# Patient Record
Sex: Female | Born: 1960 | Race: White | Hispanic: No | Marital: Married | Smoking: Former smoker
Health system: Southern US, Community
[De-identification: ages and names within clinical notes are randomized; demographics above are authoritative.]

## PROBLEM LIST (undated history)

## (undated) DIAGNOSIS — T7840XA Allergy, unspecified, initial encounter: Secondary | ICD-10-CM

## (undated) DIAGNOSIS — I10 Essential (primary) hypertension: Secondary | ICD-10-CM

## (undated) DIAGNOSIS — J302 Other seasonal allergic rhinitis: Secondary | ICD-10-CM

## (undated) DIAGNOSIS — E785 Hyperlipidemia, unspecified: Secondary | ICD-10-CM

## (undated) DIAGNOSIS — G4730 Sleep apnea, unspecified: Secondary | ICD-10-CM

## (undated) DIAGNOSIS — E119 Type 2 diabetes mellitus without complications: Secondary | ICD-10-CM

## (undated) HISTORY — DX: Hyperlipidemia, unspecified: E78.5

## (undated) HISTORY — DX: Type 2 diabetes mellitus without complications: E11.9

## (undated) HISTORY — PX: CHOLECYSTECTOMY: SHX55

## (undated) HISTORY — DX: Allergy, unspecified, initial encounter: T78.40XA

## (undated) HISTORY — DX: Sleep apnea, unspecified: G47.30

## (undated) HISTORY — PX: TUBAL LIGATION: SHX77

## (undated) HISTORY — DX: Essential (primary) hypertension: I10

## (undated) HISTORY — DX: Other seasonal allergic rhinitis: J30.2

---

## 1995-04-13 HISTORY — PX: TUBAL LIGATION: SHX77

## 2002-03-19 ENCOUNTER — Other Ambulatory Visit: Admission: RE | Admit: 2002-03-19 | Discharge: 2002-03-19 | Payer: Self-pay | Admitting: Family Medicine

## 2002-05-22 ENCOUNTER — Encounter: Payer: Self-pay | Admitting: Family Medicine

## 2002-05-22 ENCOUNTER — Encounter: Admission: RE | Admit: 2002-05-22 | Discharge: 2002-05-22 | Payer: Self-pay | Admitting: Family Medicine

## 2002-05-23 ENCOUNTER — Other Ambulatory Visit: Admission: RE | Admit: 2002-05-23 | Discharge: 2002-05-23 | Payer: Self-pay | Admitting: Family Medicine

## 2002-09-03 ENCOUNTER — Encounter: Admission: RE | Admit: 2002-09-03 | Discharge: 2002-09-03 | Payer: Self-pay | Admitting: Family Medicine

## 2002-09-03 ENCOUNTER — Encounter: Payer: Self-pay | Admitting: Family Medicine

## 2003-07-23 ENCOUNTER — Encounter: Admission: RE | Admit: 2003-07-23 | Discharge: 2003-07-23 | Payer: Self-pay | Admitting: Family Medicine

## 2003-11-14 ENCOUNTER — Emergency Department (HOSPITAL_COMMUNITY): Admission: EM | Admit: 2003-11-14 | Discharge: 2003-11-15 | Payer: Self-pay | Admitting: Emergency Medicine

## 2003-12-03 ENCOUNTER — Encounter: Admission: RE | Admit: 2003-12-03 | Discharge: 2003-12-03 | Payer: Self-pay | Admitting: Family Medicine

## 2004-03-07 ENCOUNTER — Ambulatory Visit (HOSPITAL_COMMUNITY): Admission: RE | Admit: 2004-03-07 | Discharge: 2004-03-07 | Payer: Self-pay | Admitting: Emergency Medicine

## 2004-03-07 ENCOUNTER — Emergency Department (HOSPITAL_COMMUNITY): Admission: EM | Admit: 2004-03-07 | Discharge: 2004-03-08 | Payer: Self-pay | Admitting: Emergency Medicine

## 2004-03-13 ENCOUNTER — Encounter (INDEPENDENT_AMBULATORY_CARE_PROVIDER_SITE_OTHER): Payer: Self-pay | Admitting: *Deleted

## 2004-03-13 ENCOUNTER — Ambulatory Visit (HOSPITAL_COMMUNITY): Admission: RE | Admit: 2004-03-13 | Discharge: 2004-03-14 | Payer: Self-pay | Admitting: General Surgery

## 2004-06-03 ENCOUNTER — Encounter: Admission: RE | Admit: 2004-06-03 | Discharge: 2004-06-03 | Payer: Self-pay | Admitting: Family Medicine

## 2005-04-28 ENCOUNTER — Encounter: Admission: RE | Admit: 2005-04-28 | Discharge: 2005-04-28 | Payer: Self-pay | Admitting: Obstetrics and Gynecology

## 2006-06-10 ENCOUNTER — Encounter: Admission: RE | Admit: 2006-06-10 | Discharge: 2006-06-10 | Payer: Self-pay | Admitting: Obstetrics and Gynecology

## 2006-08-03 ENCOUNTER — Ambulatory Visit: Payer: Self-pay | Admitting: Cardiovascular Disease

## 2006-08-08 ENCOUNTER — Ambulatory Visit (HOSPITAL_COMMUNITY): Admission: RE | Admit: 2006-08-08 | Discharge: 2006-08-08 | Payer: Self-pay | Admitting: Internal Medicine

## 2006-08-08 ENCOUNTER — Ambulatory Visit: Payer: Self-pay | Admitting: Internal Medicine

## 2006-08-10 ENCOUNTER — Encounter: Payer: Self-pay | Admitting: Cardiology

## 2006-08-10 ENCOUNTER — Ambulatory Visit: Payer: Self-pay

## 2006-09-06 ENCOUNTER — Ambulatory Visit: Payer: Self-pay | Admitting: Cardiovascular Disease

## 2007-07-05 ENCOUNTER — Encounter: Admission: RE | Admit: 2007-07-05 | Discharge: 2007-07-05 | Payer: Self-pay | Admitting: Obstetrics and Gynecology

## 2008-08-01 ENCOUNTER — Encounter: Admission: RE | Admit: 2008-08-01 | Discharge: 2008-08-01 | Payer: Self-pay | Admitting: Obstetrics and Gynecology

## 2009-08-04 ENCOUNTER — Encounter: Admission: RE | Admit: 2009-08-04 | Discharge: 2009-08-04 | Payer: Self-pay | Admitting: Obstetrics and Gynecology

## 2010-08-17 ENCOUNTER — Other Ambulatory Visit: Payer: Self-pay | Admitting: Obstetrics and Gynecology

## 2010-08-17 DIAGNOSIS — Z1231 Encounter for screening mammogram for malignant neoplasm of breast: Secondary | ICD-10-CM

## 2010-08-24 ENCOUNTER — Ambulatory Visit
Admission: RE | Admit: 2010-08-24 | Discharge: 2010-08-24 | Disposition: A | Discharge status: Home / Self Care | Payer: 59 | Source: Ambulatory Visit | Attending: Obstetrics and Gynecology | Admitting: Obstetrics and Gynecology

## 2010-08-24 DIAGNOSIS — Z1231 Encounter for screening mammogram for malignant neoplasm of breast: Secondary | ICD-10-CM

## 2010-08-25 NOTE — Assessment & Plan Note (Signed)
[facility]                            CARDIOLOGY OFFICE NOTE   NAME:Ahlquist, KIRBI FARRUGIA                      MRN:          [id number]  DATE:09/06/2006                            DOB:          17-Feb-1961    Danielle Chase was seen back at the [facility] Cardiology office on Sep 06, 2006.  She is a 50 year old woman who was seen here on April 23 for an  assessment of lightheadedness.  She has complained of generalized  fatigue as well as a feeling of lightheadedness.  She denies symptoms of  vertigo.  She has not had syncope.  She has no chest pain or dyspnea.  She has been seen by Dr. Sandria Manly in neurology and there has been  consideration for her symptoms arising from migraines.  She has been  started on Inderal for that problem.  She underwent an echocardiogram  and a tilt table study for further assessment after initial evaluation.  Her echocardiogram was normal.  Her left ventricular ejection fraction  was estimated at 60% and there were no regional wall motion  abnormalities.  There was no significant valvular disease seen.  There  were no other abnormalities appreciated.  Her tilt table study was also  normal, although she did not have much of a heart rate response to  isoproterenol.  She did not develop symptoms, nor did she become  hypotensive on the tilt table.   CURRENT MEDICATIONS:  1. Hydrochlorothiazide 25 mg daily.  2. Inderal ER 60 mg daily.   ALLERGIES:  No known drug allergies.   PHYSICAL EXAMINATION:  GENERAL:  The patient is alert and oriented, in  no acute distress.  She is an obese white female.  VITAL SIGNS:  Weight is 264 pounds, blood pressure is 130/80, heart rate  is 68, respiratory rate 16.  HEENT:  Normal.  NECK:  Normal carotid upstrokes without bruits.  Jugular venous pressure  is normal.  LUNGS:  Clear to auscultation bilaterally.  HEART:  Regular rate and rhythm without murmurs or gallops.  ABDOMEN:  Soft, obese,  nontender.  EXTREMITIES:  There is 1+ pretibial edema bilaterally.  Peripheral  pulses are 2+ and equal throughout.   ASSESSMENT:  Danielle Chase is a 50 year old woman with lightheadedness.  We  have been unable to find a cardiac etiology to her problem.  I do not  think further testing is required at this point.  I have asked her to  continue to try to work through things with Dr. Sandria Manly to see if her  symptoms are related to migraines as he has indicated.   I would be happy to see Danielle Chase back at any time in the future.  I  will plan on seeing her back on an as-needed basis if any specific  problems arise.     Veverly Fells. Excell Seltzer, MD  Electronically Signed    MDC/MedQ  DD: 09/06/2006  DT: 09/06/2006  Job #: [id number]   cc:   Brett Canales A. Cleta Alberts, M.D.  Genene Churn. Love, M.D.

## 2010-08-28 NOTE — Letter (Signed)
August 03, 2006    Danielle Chase, M.D.  [address]  [place], [place] [address]   RE:  Danielle, Chase  MRN:  [id number]  /  DOB:  [birth year]   Dear Dr. Cleta Chase,   It was my pleasure to see Danielle Chase as an outpatient at the [facility]  cardiology office on August 03, 2006.  As you know, she is a 50 year old  woman here for evaluation of lightheadedness.   Danielle Chase complains of progressive lightheadedness over the past several  months.  She has this symptom throughout much of the day.  She really  does not have dizziness, and she denies a spinning sensation.  She  feels fatigued and lacks energy.  She has not had true syncope.  She has  developed tunnel vision on a few occasions but has not ever lost  consciousness.  She really does not have any associated symptoms of  nausea, vomiting, chest pain, dyspnea, or other complaints.  She denies  feeling depressed but states that she has lost interest in doing many of  the things she used to enjoy.  She does have a postural component to her  dizziness, and her symptoms are worse when she first stands up.   CURRENT MEDICATIONS:  1. Hydrochlorothiazide 25 mg daily.  2. Inderal ER 60 mg daily.   ALLERGIES:  NKDA.   PAST MEDICAL HISTORY:  1. Essential hypertension, treated with medication for the past 18      months.  2. Cholecystectomy in 2006.  3. C-section in 1988.  4. Tubal ligation in 1997.   There have been no other hospitalizations or significant medical  problems to report.   SOCIAL HISTORY:  Patient is married with two children.  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  She is a former smoker but quit in 2005.  She smoked one pack daily since age 33.  She very rarely drinks alcohol  and drinks approximately three caffeinated beverages daily.  She drinks  a lot of water as well.   FAMILY HISTORY:  Her father had heart problems at age 25.  He died at  age 59 of a heart attack.  Sister had an MI at age 27.   REVIEW OF  SYSTEMS:  A complete 12-point review of systems was performed.  Pertinent positives include seasonal allergies, fatigue, anxiety, and  menstrual dysfunction.  All other systems are reviewed and are negative  except as detailed above.   PHYSICAL EXAMINATION:  GENERAL:  Patient is alert and oriented.  She is  in no acute distress.  She is an obese white female.  VITAL SIGNS:  Weight is 262 pounds.  Blood pressure is 132/86.  Heart  rate 62.  Respiratory rate 18.  Her orthostatic vital signs were taken,  and supine heart rate was 80 with a supine blood pressure of 129/80  sitting, heart rate was 68 with a sitting blood pressure of 129/76.  Standing at 2 minutes, her heart rate was 84 with a standing blood  pressure of 136/82.  She was asymptomatic with this.  HEENT:  Normal.  NECK:  Normal carotid upstrokes without bruits.  Jugular venous pressure  is normal.  No thyromegaly or thyroid nodules.  LUNGS:  Clear to auscultation bilaterally.  HEART:  The apex is not palpable.  There is no right ventricular heave  or lift.  The heart is a regular rate and rhythm without murmurs or  gallops.  ABDOMEN:  Soft, obese,  nontender.  No organomegaly.  No abdominal  bruits.  EXTREMITIES:  No clubbing, cyanosis or edema.  Peripheral pulses are 2+  and equal throughout.  SKIN:  Warm and dry without rash.  NEUROLOGIC:  Cranial nerves II-XII are intact.  Strength is 5/5 and  equal in the arms and legs bilaterally.   EKG is normal sinus rhythm with sinus arrhythmia.  It is a normal  tracing.   ASSESSMENT:  Danielle Chase is a 50 year old woman with lightheadedness.  She  has had some near-syncopal spells but has not had frank syncope.  I do  not detect any structural abnormalities on her physical exam, and her  baseline ECG is normal.  With the degree of problems she has with her  lightheadedness, will evaluate her with an echocardiogram to rule out  any structural heart disease as well as a tilt table  test.  Overall, I  think it is unlikely that she has a cardiac etiology to her symptoms,  but with her significant disability from her symptoms, I think they  warrant some investigation.   I encouraged her to continue with good fluid intake and to minimize  caffeine.  If she is found to have a problem with tilt table testing  that is suggestive of neurodeppressor syncope, it may be prudent to  discontinue her hydrochlorothiazide and use a different medication for  her hypertension, as maintaining an intravascular volume is very  important in this condition.   Dr. Cleta Chase, thanks very much for allowing me to see Danielle Chase.  I will be  in touch with you after her studies are completed.  Please feel free to  call at any time with questions regarding her care.    Sincerely,      Veverly Fells. Excell Seltzer, MD  Electronically Signed    MDC/MedQ  DD: 08/03/2006  DT: 08/04/2006  Job #: [id number]   CC:    Genene Churn. Love, M.D.

## 2010-08-28 NOTE — Op Note (Signed)
Danielle Chase, Danielle Chase               ACCOUNT NO.:  [id number]   MEDICAL RECORD NO.:  [id number]          PATIENT TYPE:  OIB   LOCATION:  5707                         FACILITY:  [facility]   PHYSICIAN:  Adolph Pollack, M.D.DATE OF BIRTH:  1960-07-29   DATE OF PROCEDURE:  03/13/2004  DATE OF DISCHARGE:  03/14/2004                                 OPERATIVE REPORT   PREOPERATIVE DIAGNOSIS:  Cholelithiasis, cholecystitis.   POSTOPERATIVE DIAGNOSIS:  Subacute cholecystitis with cholelithiasis.   OPERATION PERFORMED:  Laparoscopic cholecystectomy with intraoperative  cholangiogram.   SURGEON:  Adolph Pollack, M.D.   ASSISTANT:  Ollen Gross. Carolynne Edouard, M.D.   ANESTHESIA:  General.   INDICATIONS FOR PROCEDURE:  This 50 year old female was seen by me the  Saturday after Thanksgiving.  She went to Urgent Care because she had an  attack of abdominal pain, biliary colic in nature.  A CT scan had been  performed at [facility] demonstrated the findings were suggestive  of cholelithiasis and possible cholecystitis.  I evaluated her in the  emergency department.  She was afebrile, was pain free, had no significant  tenderness and a normal white blood cell count and a very mild elevation of  her bilirubin to 1.3.  The rest of the liver function tests were normal.  I  did not feel urgent operation was necessary at that time so she was  scheduled for elective surgery.   DESCRIPTION OF PROCEDURE:  She was seen in the holding area and brought to  the operating room, placed supine on the operating table and a general  anesthetic was administered.  The abdominal wall was sterilely prepped and  draped.  A local anesthetic was infiltrated in the subumbilical region and a  small subumbilical incision was made incising the skin and subcutaneous  tissue sharply.  The fascia and peritoneum were then incised sharply and the  peritoneal cavity was entered under direct vision.  A pursestring suture of  0  Vicryl was placed around the fascial edges.  A Hasson trocar was  introduced into the peritoneal cavity and a pneumoperitoneum was created by  insufflation of CO2 gas.  Next, she was placed in reverse Trendelenburg  position, the right side tilted up. An 11 mm port was placed through an  epigastric incision and two 5 mm ports were placed in the right midlateral  abdomen.  I identified the gallbladder and it appeared to have some acute  and chronic inflammatory changes and was somewhat distended.  Needle  decompression was then performed and green bile was evacuated from the  gallbladder.  The fundus of the gallbladder was then grasped and retracted  toward the right shoulder.  Using blunt dissection and Select cautery, I  then mobilize the infundibulum.  I identified the cystic duct and created a  window around it.  I placed a clip just at the cystic duct gallbladder  junction and made a small incision at the cystic duct.  A cholangiocatheter  was passed through the anterior abdominal wall and put into the cystic duct  and a cholangiogram was performed.  Under real time fluoroscopy, dilute contrast material was injected into the  cystic duct which was a moderate length.  Common hepatic, right and left  hepatic and common bile ducts all filled promptly and contrast entered the  duodenum.  There was no obvious evidence of obstruction.  Final reports  pending the radiologist's interpretation.   The cholangiocath was removed.  The cystic duct was then clipped three times  proximally and divided.  The cystic artery was identified and a window  created around it.  It was then clipped and divided.  The gallbladder was  dissected free from the liver bed and then placed into the EndoPouch bag.  The raw liver surface was then evaluated and bleeding points cauterized.  Surgicel was placed on the raw liver surface.  The perihepatic area was  irrigated with copious amounts of saline and fluid  evacuated.  The  gallbladder in the EndoPouch bag was then removed through the subumbilical  port and the subumbilical fascial defect was closed under laparoscopic  vision by tightening up and tying down the pursestring suture.  The trocars  were then removed and the pneumoperitoneum was released.  The skin incisions  were then closed with 4-0 Monocryl subcuticular stitches followed by Steri-  Strips and sterile dressings.  The patient tolerated the procedure well  without apparent complications and was taken to the recovery room in  satisfactory condition.      Tish Men  D:  03/18/2004  T:  03/18/2004  Job:  [id number]   cc:   Brett Canales A. Cleta Alberts, M.D.  [address]  [place]  [place] [address]  Fax: [phone]

## 2010-08-28 NOTE — H&P (Signed)
Danielle Chase, Danielle Chase               ACCOUNT NO.:  [id number]   MEDICAL RECORD NO.:  [id number]          PATIENT TYPE:  INP   LOCATION:                               FACILITY:  [facility]   PHYSICIAN:  Adolph Pollack, M.D.DATE OF BIRTH:  November 24, 1960   DATE OF ADMISSION:  DATE OF DISCHARGE:                                HISTORY & PHYSICAL   CHIEF COMPLAINT:  Right upper quadrant and right back pain.   HISTORY OF PRESENT ILLNESS:  This is a 50 year old female who had the onset  of right upper quadrant pain and back pain.  The pain seemed to radiate from  the back to the right upper quadrant.  No nausea, vomiting, or diarrhea.  No  fever or chills.  The pain began yesterday and was fairly persistent.  She  subsequently went to the urgent care center and was noted to have a slight  elevation of her white cell count 13,200.  Dr. Cleta Alberts had seen her and noticed  the right upper quadrant tenderness.  She was given a shot of Toradol with  good relief of the pain.  She was sent to [facility] where she  underwent a CT scan which demonstrated a 1.5-cm gallstone which was felt to  be potentially impacted in the neck of the gallbladder, maybe some mild  inflammatory changes but no pericholecystic fluid and common bile duct was  normal.  It was for this reason I was asked to see her.  When I arrived in  the emergency department, she was pain free.   PAST MEDICAL HISTORY:  Hypertension.   PREVIOUS OPERATIONS:  None.   ALLERGIES:  None.   MEDICATIONS:  Lasix daily.   SOCIAL HISTORY:  Former smoker.  Occasional alcohol use.  Married.   FAMILY HISTORY:  Strongly positive for gallbladder disease.   REVIEW OF SYSTEMS:  CARDIOVASCULAR:  No known heart disease.  PULMONARY:  No  asthma, no pneumonia, COPD.  GI:  No peptic ulcer disease, hepatitis,  diverticulitis.  GU:  No kidney stones.  ENDOCRINE:  No diabetes, thyroid  disease, hypercholesterolemia.  NEUROLOGIC:  No strokes or seizures.  HEMATOLOGIC:  No bleeding disorders or blood clots.   PHYSICAL EXAMINATION:  GENERAL:  A mildly obese female, no acute distress,  very pleasant and cooperative.  VITAL SIGNS:  Temperature is 99.2, blood pressure is 160/102 but then  normalized with the second check.  Pulse 87.  EYES:  Extraocular motions are intact.  No icterus.  NECK:  Supple without palpable masses, no obvious thyroid enlargement.  RESPIRATORY:  Breath sounds equal and clear.  Respirations non-labored.  CARDIOVASCULAR:  Demonstrates a regular rate and rhythm.  No murmur heard.  No lower extremity edema.  No JVD.  ABDOMEN:  Soft.  There is very minimal right upper quadrant tenderness.  No  guarding.  No Murphy sign.  No palpable masses.  No hepatosplenomegaly.  No  peritoneal signs and active bowel sounds are noted.  EXTREMITIES:  Full range of motion.  Good muscle tone.   LABORATORY DATA:  Her bilirubin is very slightly elevated  at 1.3 but the  rest of the liver function tests are normal.  Amylase lipase normal.   CT scan reviewed.   Repeat temperature is 97.8.   IMPRESSION:  Acute biliary colic but no clinical evidence of acute  cholecystitis at this time.   PLAN:  1.  We will discharge her from the emergency department.  2.  We will arrange for her to have elective cholecystectomy in the next      week or two.  3.  I have given her dietary instructions.  4.  I will give her a prescription for Darvocet for pain.  5.  I will discuss the procedure and the risks of laparoscopic      cholecystectomy with her.  The risks include but not limited to      bleeding, infection, anesthetic risk, accidental damage to internal      organs such as liver, intestine, bile duct with bile leak, and the      possibility of post cholecystectomy diarrhea.  She seems to understand      this and is agreeable with the plan.      Tish Men  D:  03/08/2004  T:  03/08/2004  Job:  [id number]   cc:   Brett Canales A. Cleta Alberts, M.D.  [address]  [place]  [place] [address]  Fax: [phone]   Quita Skye. Artis Flock, M.D.  [address]  [place]  [place] [address]  Fax: [phone]

## 2010-08-28 NOTE — Op Note (Signed)
Danielle Chase, Danielle Chase               ACCOUNT NO.:  [id number]   MEDICAL RECORD NO.:  [id number]          PATIENT TYPE:  OIB   LOCATION:  2899                         FACILITY:  [facility]   PHYSICIAN:  Doylene Canning. Ladona Ridgel, MD    DATE OF BIRTH:  05-08-1960   DATE OF PROCEDURE:  08/08/2006  DATE OF DISCHARGE:                               OPERATIVE REPORT   PROCEDURE PERFORMED:  Head-up tilt table testing.   INDICATION:  Unexplained recurrent episodes of near syncope.  The  patient is a 50 year old woman who is seen by Dr. Excell Seltzer for evaluation  of recurrent episodes of near syncope.  She has a history of  hypertension.  She is now referred for head-up tilt table testing.   PROCEDURE:  After informed consent was obtained, the patient was taken  to the diagnostics lab in the fasting state.  The usual preparation.  She was placed in the supine position where her initial blood pressure  was in the 130 range and her heart rate was in the mid 60s.  She was  placed in the 70 degree head-up tilt table position and her blood  pressure initially started out in the 130s, dropping to a nadir of  108/78.  Her heart rate increased into the mid 70s.  She had minimal  symptoms with this.  She was maintained in this position for 30 minutes  and had no additional hemodynamic changes.  She was placed back in the  supine position and her blood pressure remained in the 140 range.  He  heart rate was in the 70s.  Isoproterenol was then infused initially at  1 mcg/minute and then up to a rate of over 4 mcg/minute.  Despite this,  her heart rate increased minimally from 60 to 80 beats per minute.  Her  blood pressure again remained stable.  She was placed back in the 70-  degree head-up tilt table position and her heart rate reached a peak of  82 beats per minute.  The blood pressure remained in the 130 to 140  range.  After 10 minutes of tilting, the patient was placed back in the  supine position.  Isopro was  discontinued and she was returned to her  room in satisfactory condition.  There were no immediate procedure  complications.   RESULTS:  This head-up tilt table testing demonstrates no evidence of  inducible syncope or near syncope and demonstrated no hemodynamic  sequelae to upright tilting.  The patient did have a blunted heart rate  response to isoproterenol of uncertain clinical significance.      Doylene Canning. Ladona Ridgel, MD  Electronically Signed     Danielle Chase  D:  08/08/2006  T:  08/08/2006  Job:  [id number]   cc:   Veverly Fells. Excell Seltzer, MD  Genene Churn. Love, M.D.

## 2011-03-23 ENCOUNTER — Ambulatory Visit (INDEPENDENT_AMBULATORY_CARE_PROVIDER_SITE_OTHER): Payer: 59 | Admitting: Emergency Medicine

## 2011-03-23 DIAGNOSIS — E119 Type 2 diabetes mellitus without complications: Secondary | ICD-10-CM

## 2011-03-23 DIAGNOSIS — I10 Essential (primary) hypertension: Secondary | ICD-10-CM

## 2011-03-23 DIAGNOSIS — E789 Disorder of lipoprotein metabolism, unspecified: Secondary | ICD-10-CM

## 2011-03-23 DIAGNOSIS — G44209 Tension-type headache, unspecified, not intractable: Secondary | ICD-10-CM

## 2011-04-13 HISTORY — PX: CHOLECYSTECTOMY: SHX55

## 2011-05-27 ENCOUNTER — Other Ambulatory Visit: Payer: Self-pay | Admitting: Emergency Medicine

## 2011-05-28 ENCOUNTER — Other Ambulatory Visit: Payer: Self-pay | Admitting: Family Medicine

## 2011-05-28 MED ORDER — HYDROCHLOROTHIAZIDE 25 MG PO TABS: 25.0000 mg | ORAL_TABLET | Freq: Every day | ORAL | Status: DC

## 2011-06-24 ENCOUNTER — Other Ambulatory Visit: Payer: Self-pay | Admitting: *Deleted

## 2011-06-24 MED ORDER — PROPRANOLOL HCL ER 60 MG PO CP24: 60.0000 mg | ORAL_CAPSULE | Freq: Every day | ORAL | Status: DC

## 2011-07-24 ENCOUNTER — Other Ambulatory Visit: Payer: Self-pay | Admitting: Physician Assistant

## 2011-07-24 ENCOUNTER — Other Ambulatory Visit: Payer: Self-pay | Admitting: Emergency Medicine

## 2011-08-03 ENCOUNTER — Ambulatory Visit (INDEPENDENT_AMBULATORY_CARE_PROVIDER_SITE_OTHER): Payer: 59 | Admitting: Emergency Medicine

## 2011-08-03 VITALS — BP 126/77 | HR 57 | Temp 97.0°F | Resp 20 | Ht 66.0 in | Wt 270.8 lb

## 2011-08-03 DIAGNOSIS — N1831 Chronic kidney disease, stage 3a: Secondary | ICD-10-CM | POA: Insufficient documentation

## 2011-08-03 DIAGNOSIS — E669 Obesity, unspecified: Secondary | ICD-10-CM

## 2011-08-03 DIAGNOSIS — I152 Hypertension secondary to endocrine disorders: Secondary | ICD-10-CM | POA: Insufficient documentation

## 2011-08-03 DIAGNOSIS — E119 Type 2 diabetes mellitus without complications: Secondary | ICD-10-CM

## 2011-08-03 DIAGNOSIS — I10 Essential (primary) hypertension: Secondary | ICD-10-CM

## 2011-08-03 DIAGNOSIS — E785 Hyperlipidemia, unspecified: Secondary | ICD-10-CM

## 2011-08-03 DIAGNOSIS — E1169 Type 2 diabetes mellitus with other specified complication: Secondary | ICD-10-CM | POA: Insufficient documentation

## 2011-08-03 LAB — COMPREHENSIVE METABOLIC PANEL
ALT: 26 U/L (ref 0–35)
AST: 22 U/L (ref 0–37)
Albumin: 4.1 g/dL (ref 3.5–5.2)
Alkaline Phosphatase: 56 U/L (ref 39–117)
BUN: 17 mg/dL (ref 6–23)
CO2: 27 mEq/L (ref 19–32)
Calcium: 9.2 mg/dL (ref 8.4–10.5)
Chloride: 102 mEq/L (ref 96–112)
Creat: 0.99 mg/dL (ref 0.50–1.10)
Glucose, Bld: 141 mg/dL — ABNORMAL HIGH (ref 70–99)
Potassium: 3.9 mEq/L (ref 3.5–5.3)
Sodium: 139 mEq/L (ref 135–145)
Total Bilirubin: 0.9 mg/dL (ref 0.3–1.2)
Total Protein: 6.9 g/dL (ref 6.0–8.3)

## 2011-08-03 LAB — POCT GLYCOSYLATED HEMOGLOBIN (HGB A1C): Hemoglobin A1C: 6.2

## 2011-08-03 LAB — LIPID PANEL
Cholesterol: 119 mg/dL (ref 0–200)
HDL: 39 mg/dL — ABNORMAL LOW (ref >39)
LDL Cholesterol: 51 mg/dL (ref 0–99)
Total CHOL/HDL Ratio: 3.1 Ratio
Triglycerides: 144 mg/dL (ref <150)
VLDL: 29 mg/dL (ref 0–40)

## 2011-08-03 LAB — GLUCOSE, POCT (MANUAL RESULT ENTRY): POC Glucose: 153

## 2011-08-03 MED ORDER — PROPRANOLOL HCL ER 60 MG PO CP24: 60.0000 mg | ORAL_CAPSULE | Freq: Every day | ORAL | Status: DC

## 2011-08-03 MED ORDER — LOSARTAN POTASSIUM 50 MG PO TABS: 50.0000 mg | ORAL_TABLET | Freq: Every day | ORAL | Status: DC

## 2011-08-03 MED ORDER — SIMVASTATIN 40 MG PO TABS: 40.0000 mg | ORAL_TABLET | Freq: Every evening | ORAL | Status: DC

## 2011-08-03 MED ORDER — METFORMIN HCL ER 500 MG PO TB24: 1000.0000 mg | ORAL_TABLET | Freq: Every day | ORAL | Status: DC

## 2011-08-03 MED ORDER — DULOXETINE HCL 20 MG PO CPEP: 20.0000 mg | ORAL_CAPSULE | Freq: Every day | ORAL | Status: DC

## 2011-08-03 MED ORDER — ONE-DAILY MULTI VITAMINS PO TABS: 1.0000 | ORAL_TABLET | Freq: Every day | ORAL | Status: DC

## 2011-08-03 MED ORDER — HYDROCHLOROTHIAZIDE 25 MG PO TABS: 25.0000 mg | ORAL_TABLET | Freq: Every day | ORAL | Status: DC

## 2011-08-03 NOTE — Progress Notes (Signed)
  Subjective:    Patient ID: Danielle Chase, female    DOB: 1960-05-24, 51 y.o.   MRN: [id number]  HPI patient enters for followup of metabolic syndrome. She has been taking her medications but she has not been exercising not been losing weight she has no specific complaints today and denies chest pain bowel problems or problems with her feet    Review of Systems     Objective:   Physical Exam her physical exam reveals an alert female who is not in distress. Her neck is supple thyroid is not enlarged. Chest is clear heart regular rate no murmurs her abdomen is soft without tenderness. Patient is legally refill no open areas no sores are normal sensation and pulses   Results for orders placed in visit on 08/03/11  POCT GLYCOSYLATED HEMOGLOBIN (HGB A1C)      Component Value Range   Hemoglobin A1C 6.2    GLUCOSE, POCT (MANUAL RESULT ENTRY)      Component Value Range   POC Glucose 153         Assessment & Plan:   Patient with metabolic syndrome in for recheck. She has not been good about diet and exercise her weight is up 2 pounds from her last visit. We'll check routine labs include true work on weight loss diet and exercise.

## 2011-08-04 ENCOUNTER — Encounter: Payer: Self-pay | Admitting: Family Medicine

## 2011-08-04 LAB — MICROALBUMIN, URINE: Microalb, Ur: 1.08 mg/dL (ref 0.00–1.89)

## 2011-09-14 ENCOUNTER — Other Ambulatory Visit: Payer: Self-pay | Admitting: Obstetrics and Gynecology

## 2011-09-14 DIAGNOSIS — Z1231 Encounter for screening mammogram for malignant neoplasm of breast: Secondary | ICD-10-CM

## 2011-09-20 ENCOUNTER — Ambulatory Visit
Admission: RE | Admit: 2011-09-20 | Discharge: 2011-09-20 | Disposition: A | Discharge status: Home / Self Care | Payer: 59 | Source: Ambulatory Visit | Attending: Obstetrics and Gynecology | Admitting: Obstetrics and Gynecology

## 2011-09-20 DIAGNOSIS — Z1231 Encounter for screening mammogram for malignant neoplasm of breast: Secondary | ICD-10-CM

## 2011-09-22 ENCOUNTER — Other Ambulatory Visit: Payer: Self-pay | Admitting: Emergency Medicine

## 2011-09-30 ENCOUNTER — Ambulatory Visit (INDEPENDENT_AMBULATORY_CARE_PROVIDER_SITE_OTHER): Payer: 59 | Admitting: Emergency Medicine

## 2011-09-30 VITALS — BP 124/90 | HR 64 | Temp 98.0°F | Resp 16 | Ht 65.0 in | Wt 269.0 lb

## 2011-09-30 DIAGNOSIS — R3989 Other symptoms and signs involving the genitourinary system: Secondary | ICD-10-CM

## 2011-09-30 DIAGNOSIS — R1084 Generalized abdominal pain: Secondary | ICD-10-CM

## 2011-09-30 DIAGNOSIS — R300 Dysuria: Secondary | ICD-10-CM

## 2011-09-30 DIAGNOSIS — E119 Type 2 diabetes mellitus without complications: Secondary | ICD-10-CM

## 2011-09-30 LAB — POCT URINALYSIS DIPSTICK
Bilirubin, UA: NEGATIVE
Blood, UA: NEGATIVE
Glucose, UA: 100
Nitrite, UA: NEGATIVE
Urobilinogen, UA: 0.2

## 2011-09-30 LAB — GLUCOSE, POCT (MANUAL RESULT ENTRY): POC Glucose: 115 mg/dl — AB (ref 70–99)

## 2011-09-30 LAB — POCT UA - MICROSCOPIC ONLY
Bacteria, U Microscopic: NEGATIVE
Crystals, Ur, HPF, POC: NEGATIVE

## 2011-09-30 MED ORDER — CIPROFLOXACIN HCL 250 MG PO TABS: 250.0000 mg | ORAL_TABLET | Freq: Two times a day (BID) | ORAL | Status: AC

## 2011-09-30 NOTE — Progress Notes (Signed)
  Subjective:    Patient ID: Danielle Chase, female    DOB: 26-May-1960, 51 y.o.   MRN: [id number]  HPI patient enters with a sensation of lower abdominal discomfort she has urgency she needs to urinate. She denies any true burning. She does not check her sugars at home.    Review of Systems     Objective:   Physical Exam objective exam reveals an alert talkative female who is not ill appearing. There is no flank tenderness. The abdomen is obese. There is tenderness to palpation in the suprapubic area.  Results for orders placed in visit on 09/30/11  POCT URINALYSIS DIPSTICK      Component Value Range   Color, UA yellow     Clarity, UA clear     Glucose, UA 100     Bilirubin, UA negative     Ketones, UA negative     Spec Grav, UA 1.010     Blood, UA negative     pH, UA 6.0     Protein, UA negative     Urobilinogen, UA 0.2     Nitrite, UA negative     Leukocytes, UA small (1+)    POCT UA - MICROSCOPIC ONLY      Component Value Range   WBC, Ur, HPF, POC 1-3     RBC, urine, microscopic 1-2     Bacteria, U Microscopic negative     Mucus, UA negative     Epithelial cells, urine per micros 0-1     Crystals, Ur, HPF, POC negative     Casts, Ur, LPF, POC negative     Yeast, UA negative    GLUCOSE, POCT (MANUAL RESULT ENTRY)      Component Value Range   POC Glucose 115 (*) 70 - 99 mg/dl        Assessment & Plan:  No evidence on this study a urinary tract infection. 2 diabetes since of occasional white and red cells we'll go ahead and treat with Cipro 250 twice a day x5 days. Urine culture was done and if negative should stop the medication

## 2011-10-02 ENCOUNTER — Telehealth: Payer: Self-pay

## 2011-10-02 LAB — URINE CULTURE: Colony Count: 75000

## 2011-10-02 NOTE — Telephone Encounter (Signed)
Pt notified. See labs 

## 2011-10-02 NOTE — Telephone Encounter (Signed)
Returning call about Lab Results.

## 2011-10-15 ENCOUNTER — Telehealth: Payer: Self-pay

## 2011-10-15 NOTE — Telephone Encounter (Signed)
PT STATES SHE WAS GIVEN CIPRO AND DIDN'T GET ENOUGH SHE DOESN'T THINK. IS GOING OUT OF [place]OWN AND WOULD LIKE TO HAVE ANOTHER SUPPLY PLEASE CALL [phone]

## 2011-10-16 NOTE — Telephone Encounter (Signed)
Please get symptoms, did it get any better, why do she need more

## 2011-10-17 NOTE — Telephone Encounter (Signed)
LMOM TO CB 

## 2011-10-18 MED ORDER — CIPROFLOXACIN HCL 250 MG PO TABS: 250.0000 mg | ORAL_TABLET | Freq: Two times a day (BID) | ORAL | Status: AC

## 2011-10-18 NOTE — Telephone Encounter (Signed)
Okay to call in Cipro 250 mg to take 1 twice a day for 5 days #10

## 2011-10-18 NOTE — Telephone Encounter (Signed)
Spoke to pt who is in [place] on vac. She states that her Sxs did improve w/the Cipro, but has not completely resolved. She is not hurting much now, but still urinary freq and some pressure. Pt reports she is taking cranberry pills and drinking a lot of water, but didn't know if she needed to be on another round of cipro since she only took it for 5 days? If Dr Cleta Alberts wants her to be back on the Cipro, she will get the name of a pharm down there so that we can send it there.

## 2011-10-18 NOTE — Telephone Encounter (Signed)
Spoke with patient and notified we would call in rx.

## 2011-11-22 ENCOUNTER — Other Ambulatory Visit: Payer: Self-pay | Admitting: Emergency Medicine

## 2011-11-23 ENCOUNTER — Other Ambulatory Visit: Payer: Self-pay | Admitting: *Deleted

## 2011-11-23 MED ORDER — METFORMIN HCL ER 500 MG PO TB24: 1000.0000 mg | ORAL_TABLET | Freq: Every day | ORAL | Status: DC

## 2011-12-07 ENCOUNTER — Ambulatory Visit (INDEPENDENT_AMBULATORY_CARE_PROVIDER_SITE_OTHER): Payer: 59 | Admitting: Emergency Medicine

## 2011-12-07 ENCOUNTER — Encounter: Payer: Self-pay | Admitting: Emergency Medicine

## 2011-12-07 VITALS — BP 120/78 | HR 82 | Temp 96.6°F | Resp 18 | Ht 65.5 in | Wt 268.4 lb

## 2011-12-07 DIAGNOSIS — E782 Mixed hyperlipidemia: Secondary | ICD-10-CM

## 2011-12-07 DIAGNOSIS — E119 Type 2 diabetes mellitus without complications: Secondary | ICD-10-CM

## 2011-12-07 DIAGNOSIS — I10 Essential (primary) hypertension: Secondary | ICD-10-CM

## 2011-12-07 LAB — CBC WITH DIFFERENTIAL/PLATELET
Basophils Absolute: 0.1 10*3/uL (ref 0.0–0.1)
Basophils Relative: 1 % (ref 0–1)
Eosinophils Absolute: 0.2 10*3/uL (ref 0.0–0.7)
MCH: 31.8 pg (ref 26.0–34.0)
MCHC: 35.1 g/dL (ref 30.0–36.0)
Neutrophils Relative %: 59 % (ref 43–77)
Platelets: 221 10*3/uL (ref 150–400)
RBC: 4.56 MIL/uL (ref 3.87–5.11)
RDW: 13.5 % (ref 11.5–15.5)

## 2011-12-07 LAB — COMPREHENSIVE METABOLIC PANEL
ALT: 21 U/L (ref 0–35)
AST: 18 U/L (ref 0–37)
CO2: 29 mEq/L (ref 19–32)
Calcium: 9.6 mg/dL (ref 8.4–10.5)
Chloride: 102 mEq/L (ref 96–112)
Sodium: 140 mEq/L (ref 135–145)
Total Protein: 6.9 g/dL (ref 6.0–8.3)

## 2011-12-07 LAB — POCT GLYCOSYLATED HEMOGLOBIN (HGB A1C): Hemoglobin A1C: 5.9

## 2011-12-07 LAB — LIPID PANEL
LDL Cholesterol: 76 mg/dL (ref 0–99)
VLDL: 27 mg/dL (ref 0–40)

## 2011-12-07 NOTE — Progress Notes (Signed)
  Subjective:    Patient ID: Danielle Chase, female    DOB: 03/04/1961, 51 y.o.   MRN: [id number]  HPI patient states she feels fine. She has been trying to watch her diet and exercise. She overall is doing well. She is under treatment for diabetes hypertension and high cholesterol. She is a typical habitus for metabolic syndrome to    Review of Systems     Objective:   Physical Exam  Constitutional: She appears well-developed and well-nourished.  HENT:  Head: Normocephalic.  Eyes: Pupils are equal, round, and reactive to light.  Neck: No thyromegaly present.  Cardiovascular: Normal rate and regular rhythm.   Pulmonary/Chest: Effort normal and breath sounds normal. No respiratory distress. She has no wheezes. She has no rales.  Abdominal: Soft. Bowel sounds are normal.  Musculoskeletal: Normal range of motion.  Neurological:       Examination of feet revealed no breakdown. Pulses are 2+ and symmetrical    Results for orders placed in visit on 12/07/11  POCT GLYCOSYLATED HEMOGLOBIN (HGB A1C)      Component Value Range   Hemoglobin A1C 5.9    GLUCOSE, POCT (MANUAL RESULT ENTRY)      Component Value Range   POC Glucose 158 (*) 70 - 99 mg/dl        Assessment & Plan:  Please consider a colonoscopy. Please consider having the shingles vaccine. No change in medications at the present time. Please keep your flu shot as instructed

## 2011-12-07 NOTE — Patient Instructions (Addendum)
Please consider having a colonoscopy. Recheck 3-4 months. Continue to work on diet exercise and weight loss. He had been given a prescription for his shingles vaccine.

## 2012-01-22 ENCOUNTER — Other Ambulatory Visit: Payer: Self-pay | Admitting: Physician Assistant

## 2012-02-24 ENCOUNTER — Other Ambulatory Visit: Payer: Self-pay | Admitting: Physician Assistant

## 2012-03-21 ENCOUNTER — Encounter: Payer: Self-pay | Admitting: Emergency Medicine

## 2012-03-21 ENCOUNTER — Ambulatory Visit (INDEPENDENT_AMBULATORY_CARE_PROVIDER_SITE_OTHER): Payer: 59 | Admitting: Emergency Medicine

## 2012-03-21 VITALS — BP 132/82 | HR 62 | Temp 97.8°F | Resp 18 | Ht 65.0 in | Wt 250.0 lb

## 2012-03-21 DIAGNOSIS — I10 Essential (primary) hypertension: Secondary | ICD-10-CM

## 2012-03-21 DIAGNOSIS — E785 Hyperlipidemia, unspecified: Secondary | ICD-10-CM

## 2012-03-21 DIAGNOSIS — E119 Type 2 diabetes mellitus without complications: Secondary | ICD-10-CM

## 2012-03-21 LAB — COMPREHENSIVE METABOLIC PANEL
Albumin: 4.1 g/dL (ref 3.5–5.2)
BUN: 13 mg/dL (ref 6–23)
CO2: 27 mEq/L (ref 19–32)
Glucose, Bld: 109 mg/dL — ABNORMAL HIGH (ref 70–99)
Potassium: 3.7 mEq/L (ref 3.5–5.3)
Sodium: 137 mEq/L (ref 135–145)
Total Bilirubin: 0.8 mg/dL (ref 0.3–1.2)
Total Protein: 6.8 g/dL (ref 6.0–8.3)

## 2012-03-21 LAB — LIPID PANEL
Cholesterol: 108 mg/dL (ref 0–200)
HDL: 36 mg/dL — ABNORMAL LOW (ref >39)
Triglycerides: 116 mg/dL (ref <150)

## 2012-03-21 LAB — GLUCOSE, POCT (MANUAL RESULT ENTRY): POC Glucose: 125 mg/dl — AB (ref 70–99)

## 2012-03-21 MED ORDER — METFORMIN HCL ER 500 MG PO TB24: 1000.0000 mg | ORAL_TABLET | Freq: Every day | ORAL | Status: DC

## 2012-03-21 NOTE — Progress Notes (Signed)
Subjective:    Patient ID: Danielle Chase, female    DOB: 01/02/1961, 51 y.o.   MRN: [id number]  HPI problem #1 diabetes. Patient has been following her diet and taking medications as instructed. She does check her sugars once a day and they have been under 140. Problem #2 high cholesterol. She continues on diet and Zocor for this problem. Problem #3 hypertension. She's currently on diuretics and beta blockers. She tolerates these medications well. She's also on an ARB. Problem #4 depression. She's doing well on Cymbalta not having difficulty with this medication.    Review of Systems     Objective:   Physical Exam  Constitutional: She appears well-developed and well-nourished.  Eyes: Pupils are equal, round, and reactive to light.  Neck: No thyromegaly present.  Cardiovascular: Normal rate, regular rhythm and normal heart sounds.  Exam reveals no gallop and no friction rub.   No murmur heard. Pulmonary/Chest: Effort normal and breath sounds normal. No respiratory distress. She has no wheezes.  Abdominal: Soft. Bowel sounds are normal. There is no tenderness. There is no rebound.  Neurological:       Assessment is posterior tibial pulses are 2+. She has normal sensation in her feet. She has good color. There is no edema.   Results for orders placed in visit on 12/07/11  CBC WITH DIFFERENTIAL      Component Value Range   WBC 5.4  4.0 - 10.5 K/uL   RBC 4.56  3.87 - 5.11 MIL/uL   Hemoglobin 14.5  12.0 - 15.0 g/dL   HCT 56.4  33.2 - 95.1 %   MCV 90.6  78.0 - 100.0 fL   MCH 31.8  26.0 - 34.0 pg   MCHC 35.1  30.0 - 36.0 g/dL   RDW 88.4  16.6 - 06.3 %   Platelets 221  150 - 400 K/uL   Neutrophils Relative 59  43 - 77 %   Neutro Abs 3.2  1.7 - 7.7 K/uL   Lymphocytes Relative 25  12 - 46 %   Lymphs Abs 1.3  0.7 - 4.0 K/uL   Monocytes Relative 11  3 - 12 %   Monocytes Absolute 0.6  0.1 - 1.0 K/uL   Eosinophils Relative 4  0 - 5 %   Eosinophils Absolute 0.2  0.0 - 0.7 K/uL   Basophils Relative 1  0 - 1 %   Basophils Absolute 0.1  0.0 - 0.1 K/uL   Smear Review Criteria for review not met    POCT GLYCOSYLATED HEMOGLOBIN (HGB A1C)      Component Value Range   Hemoglobin A1C 5.9    GLUCOSE, POCT (MANUAL RESULT ENTRY)      Component Value Range   POC Glucose 158 (*) 70 - 99 mg/dl  LIPID PANEL      Component Value Range   Cholesterol 139  0 - 200 mg/dL   Triglycerides 016  <010 mg/dL   HDL 36 (*) >93 mg/dL   Total CHOL/HDL Ratio 3.9     VLDL 27  0 - 40 mg/dL   LDL Cholesterol 76  0 - 99 mg/dL  COMPREHENSIVE METABOLIC PANEL      Component Value Range   Sodium 140  135 - 145 mEq/L   Potassium 3.9  3.5 - 5.3 mEq/L   Chloride 102  96 - 112 mEq/L   CO2 29  19 - 32 mEq/L   Glucose, Bld 153 (*) 70 - 99 mg/dL   BUN 20  6 - 23 mg/dL   Creat 2.95  6.21 - 3.08 mg/dL   Total Bilirubin 0.9  0.3 - 1.2 mg/dL   Alkaline Phosphatase 55  39 - 117 U/L   AST 18  0 - 37 U/L   ALT 21  0 - 35 U/L   Total Protein 6.9  6.0 - 8.3 g/dL   Albumin 3.9  3.5 - 5.2 g/dL   Calcium 9.6  8.4 - 65.7 mg/dL   . Results for orders placed in visit on 03/21/12  GLUCOSE, POCT (MANUAL RESULT ENTRY)      Component Value Range   POC Glucose 125 (*) 70 - 99 mg/dl  POCT GLYCOSYLATED HEMOGLOBIN (HGB A1C)      Component Value Range   Hemoglobin A1C 5.4          Assessment & Plan:  Hemoglobin A1c is a goal. No changes in medications at present time.

## 2012-03-22 ENCOUNTER — Other Ambulatory Visit: Payer: Self-pay | Admitting: Physician Assistant

## 2012-03-23 ENCOUNTER — Encounter: Payer: Self-pay | Admitting: *Deleted

## 2012-03-30 ENCOUNTER — Telehealth: Payer: Self-pay

## 2012-03-30 NOTE — Telephone Encounter (Signed)
RTC

## 2012-03-30 NOTE — Telephone Encounter (Signed)
We have not seen her for this since July - needs to RTC for evaluation

## 2012-03-30 NOTE — Telephone Encounter (Signed)
PT STATES SHE HAVE ANOTHER UTI  AND STILL HAVE SOME OF THE PAIN MEDICINE BUT WOULD LIKE TO HAVE AN ANTIBIOTIC CALLED IN ALSO PLEASE CALL PT AT [phone]    [facility] ON [address]

## 2012-03-30 NOTE — Telephone Encounter (Signed)
I have advised patient 

## 2012-08-12 ENCOUNTER — Other Ambulatory Visit: Payer: Self-pay | Admitting: Emergency Medicine

## 2012-08-22 ENCOUNTER — Ambulatory Visit (INDEPENDENT_AMBULATORY_CARE_PROVIDER_SITE_OTHER): Payer: 59 | Admitting: Emergency Medicine

## 2012-08-22 ENCOUNTER — Encounter: Payer: Self-pay | Admitting: Emergency Medicine

## 2012-08-22 VITALS — BP 112/70 | HR 67 | Temp 97.8°F | Resp 16 | Ht 65.0 in | Wt 229.0 lb

## 2012-08-22 DIAGNOSIS — E119 Type 2 diabetes mellitus without complications: Secondary | ICD-10-CM

## 2012-08-22 DIAGNOSIS — I10 Essential (primary) hypertension: Secondary | ICD-10-CM

## 2012-08-22 DIAGNOSIS — E785 Hyperlipidemia, unspecified: Secondary | ICD-10-CM

## 2012-08-22 DIAGNOSIS — J309 Allergic rhinitis, unspecified: Secondary | ICD-10-CM

## 2012-08-22 LAB — LIPID PANEL
Cholesterol: 110 mg/dL (ref 0–200)
LDL Cholesterol: 47 mg/dL (ref 0–99)
Total CHOL/HDL Ratio: 3.2 Ratio
Triglycerides: 147 mg/dL (ref <150)
VLDL: 29 mg/dL (ref 0–40)

## 2012-08-22 LAB — BASIC METABOLIC PANEL
BUN: 18 mg/dL (ref 6–23)
Calcium: 9.2 mg/dL (ref 8.4–10.5)
Creat: 1.11 mg/dL — ABNORMAL HIGH (ref 0.50–1.10)

## 2012-08-22 MED ORDER — FLUTICASONE PROPIONATE 50 MCG/ACT NA SUSP: 2.0000 | Freq: Every day | NASAL | Status: DC

## 2012-08-22 NOTE — Progress Notes (Signed)
  Subjective:    Patient ID: Danielle Chase, female    DOB: 1960-08-07, 52 y.o.   MRN: [id number]  HPI patient enters for followup of metabolic syndrome. She has hypertension diabetes and high cholesterol. She has no complaints today. She states she has been exercising she denies chest pain shortness of breath GI symptoms or neuropathy of her legs.    Review of Systems     Objective:   Physical Exam Patient is alert and cooperative. Her neck is supple. Her chest is clear to auscultation and percussion. Her heart is regular rate without murmurs rubs or gallops. Abdomen is soft and nontender. Extremity exam reveals no evidence of neuropathy.  Results for orders placed in visit on 08/22/12  GLUCOSE, POCT (MANUAL RESULT ENTRY)      Result Value Range   POC Glucose 106 (*) 70 - 99 mg/dl  POCT GLYCOSYLATED HEMOGLOBIN (HGB A1C)      Result Value Range   Hemoglobin A1C 5.0         Assessment & Plan:  Sugar is great. Awaiting her lipid panel and other testing

## 2012-08-24 ENCOUNTER — Other Ambulatory Visit: Payer: Self-pay | Admitting: Emergency Medicine

## 2012-08-29 ENCOUNTER — Other Ambulatory Visit: Payer: Self-pay

## 2012-08-29 DIAGNOSIS — Z1231 Encounter for screening mammogram for malignant neoplasm of breast: Secondary | ICD-10-CM

## 2012-09-01 ENCOUNTER — Ambulatory Visit (INDEPENDENT_AMBULATORY_CARE_PROVIDER_SITE_OTHER): Payer: 59 | Admitting: Physician Assistant

## 2012-09-01 VITALS — BP 116/82 | HR 78 | Temp 98.1°F | Resp 16 | Ht 64.0 in | Wt 229.0 lb

## 2012-09-01 DIAGNOSIS — N390 Urinary tract infection, site not specified: Secondary | ICD-10-CM

## 2012-09-01 LAB — POCT UA - MICROSCOPIC ONLY
Crystals, Ur, HPF, POC: NEGATIVE
Mucus, UA: NEGATIVE

## 2012-09-01 LAB — POCT URINALYSIS DIPSTICK
Bilirubin, UA: NEGATIVE
Glucose, UA: NEGATIVE
Ketones, UA: NEGATIVE
Spec Grav, UA: <=1.005
Urobilinogen, UA: 0.2

## 2012-09-01 MED ORDER — NITROFURANTOIN MONOHYD MACRO 100 MG PO CAPS: 100.0000 mg | ORAL_CAPSULE | Freq: Two times a day (BID) | ORAL | Status: DC

## 2012-09-01 NOTE — Progress Notes (Signed)
   [address], [place] [address]   Phone [phone]  Subjective:    Patient ID: Danielle Chase, female    DOB: July 31, 1960, 52 y.o.   MRN: [id number]  HPI Pt presents to clinic with concerns that she has an UTI.  She has had symptoms for a week but has been increased her water intake and started to drink cranberry juice.  She gets these symptoms from time to time but typically can get it to go away without needing medications.     Review of Systems  Constitutional: Negative for fever and chills.  Gastrointestinal: Negative for nausea and abdominal pain.  Genitourinary: Positive for dysuria, urgency and frequency. Negative for vaginal discharge.       Urine has bad odor  Musculoskeletal: Negative for back pain.       Objective:   Physical Exam  Vitals reviewed. Constitutional: She is oriented to person, place, and time. She appears well-developed and well-nourished.  HENT:  Head: Normocephalic and atraumatic.  Right Ear: External ear normal.  Left Ear: External ear normal.  Cardiovascular: Normal rate, regular rhythm and normal heart sounds.   No murmur heard. Pulmonary/Chest: Effort normal.  Abdominal: Soft. There is tenderness (suprapubic TTP). There is CVA tenderness.  Neurological: She is alert and oriented to person, place, and time.  Skin: Skin is warm and dry.  Psychiatric: She has a normal mood and affect. Her behavior is normal. Judgment and thought content normal.      Assessment & Plan:  UTI (urinary tract infection) - Plan: POCT urinalysis dipstick, POCT UA - Microscopic Only, Urine culture, nitrofurantoin, macrocrystal-monohydrate, (MACROBID) 100 MG capsule - pt to continue drinking water and cranberry use.  Benny Lennert PA-C 09/01/2012 6:07 PM

## 2012-09-03 LAB — URINE CULTURE

## 2012-09-14 ENCOUNTER — Telehealth: Payer: Self-pay

## 2012-09-14 NOTE — Telephone Encounter (Signed)
Called her back she indicates the UTI did improve now has returned. She should return to clinic for repeat U/A and culture.

## 2012-09-14 NOTE — Telephone Encounter (Signed)
Patient would like to talk to Benny Lennert regarding having a uti please call her at [phone]

## 2012-09-20 ENCOUNTER — Other Ambulatory Visit: Payer: Self-pay | Admitting: Emergency Medicine

## 2012-09-27 ENCOUNTER — Ambulatory Visit
Admission: RE | Admit: 2012-09-27 | Discharge: 2012-09-27 | Disposition: A | Discharge status: Home / Self Care | Payer: 59 | Source: Ambulatory Visit

## 2012-09-27 DIAGNOSIS — Z1231 Encounter for screening mammogram for malignant neoplasm of breast: Secondary | ICD-10-CM

## 2012-10-23 ENCOUNTER — Other Ambulatory Visit: Payer: Self-pay | Admitting: Physician Assistant

## 2012-10-24 ENCOUNTER — Telehealth: Payer: Self-pay

## 2012-10-24 NOTE — Telephone Encounter (Signed)
Patient has some questions about her metformin rx please call her at [phone]

## 2012-10-25 MED ORDER — METFORMIN HCL ER 500 MG PO TB24: 1000.0000 mg | ORAL_TABLET | Freq: Every day | ORAL | Status: DC

## 2012-10-25 NOTE — Telephone Encounter (Signed)
Called her. She has appt. Metformin sent in.

## 2012-11-18 ENCOUNTER — Other Ambulatory Visit: Payer: Self-pay | Admitting: Physician Assistant

## 2012-11-24 ENCOUNTER — Ambulatory Visit (INDEPENDENT_AMBULATORY_CARE_PROVIDER_SITE_OTHER): Payer: 59 | Admitting: Physician Assistant

## 2012-11-24 VITALS — BP 140/92 | HR 76 | Temp 98.0°F | Resp 16 | Ht 64.0 in | Wt 232.0 lb

## 2012-11-24 DIAGNOSIS — R300 Dysuria: Secondary | ICD-10-CM

## 2012-11-24 DIAGNOSIS — N390 Urinary tract infection, site not specified: Secondary | ICD-10-CM

## 2012-11-24 LAB — POCT WET PREP WITH KOH
Bacteria Wet Prep HPF POC: NEGATIVE
Clue Cells Wet Prep HPF POC: NEGATIVE
RBC Wet Prep HPF POC: NEGATIVE
Trichomonas, UA: NEGATIVE
Yeast Wet Prep HPF POC: NEGATIVE

## 2012-11-24 LAB — POCT URINALYSIS DIPSTICK
Bilirubin, UA: NEGATIVE
Blood, UA: NEGATIVE
Glucose, UA: NEGATIVE
Nitrite, UA: NEGATIVE

## 2012-11-24 LAB — POCT UA - MICROSCOPIC ONLY
Bacteria, U Microscopic: NEGATIVE
Crystals, Ur, HPF, POC: NEGATIVE

## 2012-11-24 MED ORDER — CIPROFLOXACIN HCL 250 MG PO TABS: 250.0000 mg | ORAL_TABLET | Freq: Two times a day (BID) | ORAL | Status: DC

## 2012-11-24 NOTE — Progress Notes (Signed)
Patient ID: Danielle Chase MRN: [id number], DOB: 07-06-1960, 52 y.o. Date of Encounter: 11/24/2012, 5:57 PM  Primary Physician: Lucilla Edin, MD  Chief Complaint: Dysuria, urinary frequency and urgency  HPI: 52 y.o. year old female with history below presents with a 14 day history of dysuria, urinary frequency, and urinary urgency. Afebrile. No chills. Slightly decreased appetite sometimes. No nausea or vomiting. No flank or low back pain. Mild suprapubic fullness, otherwise no abdominal pain. No history of STD's. No vaginal complaints.   States that she is getting UTI's about every 2-3 months. Last UTI was in May. Treated successfully with Macrobid. Symptoms fully resolved. She drinks about 4-5 sixteen or twenty oz water bottles per day. Does not hold her urine for extended times periods. Voids prior to and after intercourse to the best of her ability. She notes some irritation if she drinks dark diet cola. She would like to know what she can do about this long term.    Past Medical History  Diagnosis Date  . Diabetes mellitus without complication   . Hypertension   . Hyperlipidemia      Home Meds: Prior to Admission medications   Medication Sig Start Date End Date Taking? Authorizing Provider  aspirin 81 MG tablet Take 81 mg by mouth daily.   Yes Historical Provider, MD  DULoxetine (CYMBALTA) 20 MG capsule TAKE ONE CAPSULE BY MOUTH DAILY 10/23/12  Yes Collene Gobble, MD  fluticasone (FLONASE) 50 MCG/ACT nasal spray Place 2 sprays into the nose daily. 08/22/12  Yes Collene Gobble, MD  hydrochlorothiazide (HYDRODIURIL) 25 MG tablet TAKE 1 TABLET BY MOUTH DAILY 08/24/12  Yes Collene Gobble, MD  losartan (COZAAR) 50 MG tablet TAKE 1 TABLET BY MOUTH DAILY 09/20/12  Yes Collene Gobble, MD  metFORMIN (GLUCOPHAGE-XR) 500 MG 24 hr tablet TAKE 2 TABLETS BY MOUTH DAILY WITH BREAKFAST 11/18/12  Yes Sondra Barges, PA-C  Multiple Vitamins tablet TAKE 1 TABLET BY MOUTH DAILY 08/24/12  Yes Collene Gobble, MD    propranolol ER (INDERAL LA) 60 MG 24 hr capsule TAKE 1 CAPSULE BY MOUTH DAILY 08/24/12  Yes Collene Gobble, MD  simvastatin (ZOCOR) 40 MG tablet Take 0.5 tablets (20 mg total) by mouth at bedtime. 08/24/12  Yes Collene Gobble, MD    Allergies: No Known Allergies  History   Social History  . Marital Status: Married    Spouse Name: N/A    Number of Children: N/A  . Years of Education: N/A   Occupational History  . Not on file.   Social History Main Topics  . Smoking status: Former Smoker    Types: Cigarettes  . Smokeless tobacco: Never Used     Comment: quit 12 yrs ago  . Alcohol Use: Yes     Comment: socially  . Drug Use: No  . Sexual Activity: Not on file   Other Topics Concern  . Not on file   Social History Narrative  . No narrative on file     Review of Systems: Constitutional: negative for chills, fever, or fatigue  HEENT: negative for vision changes or hearing loss Cardiovascular: negative for chest pain or palpitations Respiratory: negative for wheezing, shortness of breath, or cough Abdominal: negative for abdominal pain, nausea, vomiting, or diarrhea Genitourinary: negative for abnormal vaginal bleeding, discharge, burning, pruritis, menopause symptoms, or pelvic pain Dermatological: negative for rash Neurologic: negative for headache, dizziness, or syncope   Physical Exam: Blood pressure 140/92, pulse 76, temperature  98 F (36.7 C), temperature source Oral, resp. rate 16, height 5\' 4"  (1.626 m), weight 232 lb (105.235 kg), SpO2 98.00%., Body mass index is 39.8 kg/(m^2). General: Well developed, well nourished, in no acute distress. Head: Normocephalic, atraumatic, eyes without discharge, sclera non-icteric, nares are without discharge. Bilateral auditory canals clear, TM's are without perforation, pearly grey and translucent with reflective cone of light bilaterally. Oral cavity moist, posterior pharynx without exudate, erythema, peritonsillar abscess, or post  nasal drip.  Neck: Supple. No thyromegaly. Full ROM. No lymphadenopathy. Lungs: Clear bilaterally to auscultation without wheezes, rales, or rhonchi. Breathing is unlabored. Heart: RRR with S1 S2. No murmurs, rubs, or gallops appreciated. Abdomen: Soft, non-tender, non-distended with normoactive bowel sounds. Mild suprapubic fullness to palpation, creates sensation of needing to void. No hepatosplenomegaly. No rebound/guarding. No obvious abdominal masses. No CVA tenderness. Negative McBurney's, Rovsing's, Iliopsoas, and table jar test. Msk:  Strength and tone normal for age. Extremities/Skin: Warm and dry. No clubbing or cyanosis. No edema. No rashes or suspicious lesions. Neuro: Alert and oriented X 3. Moves all extremities spontaneously. Gait is normal. CNII-XII grossly in tact. Psych:  Responds to questions appropriately with a normal affect.   Labs: Results for orders placed in visit on 11/24/12  POCT URINALYSIS DIPSTICK      Result Value Range   Color, UA lt yellow     Clarity, UA clear     Glucose, UA neg     Bilirubin, UA neg     Ketones, UA neg     Spec Grav, UA <=1.005     Blood, UA neg     pH, UA 6.0     Protein, UA neg     Urobilinogen, UA 0.2     Nitrite, UA neg     Leukocytes, UA moderate (2+)    POCT UA - MICROSCOPIC ONLY      Result Value Range   WBC, Ur, HPF, POC 2-6     RBC, urine, microscopic 0-1     Bacteria, U Microscopic neg     Mucus, UA neg     Epithelial cells, urine per micros 1-5     Crystals, Ur, HPF, POC neg     Casts, Ur, LPF, POC neg     Yeast, UA neg    POCT WET PREP WITH KOH      Result Value Range   Trichomonas, UA Negative     Clue Cells Wet Prep HPF POC neg     Epithelial Wet Prep HPF POC 1-3     Yeast Wet Prep HPF POC neg     Bacteria Wet Prep HPF POC neg     RBC Wet Prep HPF POC neg     WBC Wet Prep HPF POC 0-2     KOH Prep POC Negative      Urine culture pending  ASSESSMENT AND PLAN:  52 y.o. female with UTI. -Cipro 250 mg 1  po bid #10 no RF -Push fluids -Await culture results -RTC precautions -Will plan for possible long term evaluation/treatment into recurrent UTI once we have her cx back from today   Signed, Eula Listen, PA-C 11/24/2012 5:57 PM

## 2012-11-26 LAB — URINE CULTURE

## 2012-12-16 ENCOUNTER — Other Ambulatory Visit: Payer: Self-pay | Admitting: Physician Assistant

## 2012-12-26 ENCOUNTER — Encounter: Payer: Self-pay | Admitting: Emergency Medicine

## 2012-12-26 ENCOUNTER — Ambulatory Visit (INDEPENDENT_AMBULATORY_CARE_PROVIDER_SITE_OTHER): Payer: 59 | Admitting: Emergency Medicine

## 2012-12-26 VITALS — BP 120/80 | HR 56 | Temp 97.8°F | Resp 16 | Ht 65.0 in | Wt 228.0 lb

## 2012-12-26 DIAGNOSIS — Z23 Encounter for immunization: Secondary | ICD-10-CM

## 2012-12-26 DIAGNOSIS — E119 Type 2 diabetes mellitus without complications: Secondary | ICD-10-CM

## 2012-12-26 DIAGNOSIS — E669 Obesity, unspecified: Secondary | ICD-10-CM

## 2012-12-26 DIAGNOSIS — R300 Dysuria: Secondary | ICD-10-CM

## 2012-12-26 LAB — POCT GLYCOSYLATED HEMOGLOBIN (HGB A1C): Hemoglobin A1C: 5.1

## 2012-12-26 LAB — POCT URINALYSIS DIPSTICK
Bilirubin, UA: NEGATIVE
Blood, UA: NEGATIVE
Ketones, UA: NEGATIVE
Protein, UA: NEGATIVE
pH, UA: 7

## 2012-12-26 LAB — GLUCOSE, POCT (MANUAL RESULT ENTRY): POC Glucose: 112 mg/dl — AB (ref 70–99)

## 2012-12-26 NOTE — Patient Instructions (Signed)
Decrease  metformin to 1 tablet a day recheck in 3-4 month

## 2012-12-26 NOTE — Progress Notes (Signed)
  Subjective:    Patient ID: Danielle Chase, female    DOB: 1960-07-23, 52 y.o.   MRN: [id number]  HPI patient here to followup on blood pressure and diabetes. She's overall doing well on her current medication regimen. She does have some symptoms of fatigue. Her husband states she used to snore but when she was able to lose weight though symptoms resolved she recently has been bothered with urinary tract infections but no longer has those symptoms. She's had a lot of stress at home related to recent death in the family .    Review of Systems     Objective:   Physical Exam H. EENT exam is unremarkable. Neck is supple. Chest is clear to auscultation and percussion. Heart regular rate no murmurs. Extremities are without neuropathy pulses are symmetrical no swelling  Results for orders placed in visit on 12/26/12  GLUCOSE, POCT (MANUAL RESULT ENTRY)      Result Value Range   POC Glucose 112 (*) 70 - 99 mg/dl  POCT GLYCOSYLATED HEMOGLOBIN (HGB A1C)      Result Value Range   Hemoglobin A1C 5.1    POCT URINALYSIS DIPSTICK      Result Value Range   Color, UA yellow     Clarity, UA clear     Glucose, UA neg     Bilirubin, UA neg     Ketones, UA neg     Spec Grav, UA 1.025     Blood, UA neg     pH, UA 7.0     Protein, UA neg     Urobilinogen, UA 0.2     Nitrite, UA neg     Leukocytes, UA Negative          Assessment & Plan:  Decrease metformen to one tablet a day

## 2013-02-18 ENCOUNTER — Other Ambulatory Visit: Payer: Self-pay | Admitting: Emergency Medicine

## 2013-02-18 ENCOUNTER — Other Ambulatory Visit: Payer: Self-pay | Admitting: Physician Assistant

## 2013-02-19 ENCOUNTER — Ambulatory Visit: Payer: 59

## 2013-02-19 ENCOUNTER — Ambulatory Visit (INDEPENDENT_AMBULATORY_CARE_PROVIDER_SITE_OTHER): Payer: 59 | Admitting: Emergency Medicine

## 2013-02-19 VITALS — BP 112/60 | HR 74 | Temp 98.0°F | Resp 16 | Ht 65.0 in | Wt 240.0 lb

## 2013-02-19 DIAGNOSIS — R3915 Urgency of urination: Secondary | ICD-10-CM

## 2013-02-19 DIAGNOSIS — N390 Urinary tract infection, site not specified: Secondary | ICD-10-CM

## 2013-02-19 DIAGNOSIS — R300 Dysuria: Secondary | ICD-10-CM

## 2013-02-19 LAB — POCT URINALYSIS DIPSTICK
Glucose, UA: NEGATIVE
Protein, UA: NEGATIVE
Spec Grav, UA: 1.015
Urobilinogen, UA: 0.2
pH, UA: 7

## 2013-02-19 LAB — POCT UA - MICROSCOPIC ONLY
Casts, Ur, LPF, POC: NEGATIVE
Crystals, Ur, HPF, POC: NEGATIVE
Mucus, UA: POSITIVE
Yeast, UA: NEGATIVE

## 2013-02-19 MED ORDER — CIPROFLOXACIN HCL 250 MG PO TABS: 250.0000 mg | ORAL_TABLET | Freq: Two times a day (BID) | ORAL | Status: DC

## 2013-02-19 NOTE — Progress Notes (Addendum)
Subjective:    Patient ID: Danielle Chase, female    DOB: 12/27/1960, 52 y.o.   MRN: [id number] This chart was scribed for Collene Gobble, MD by Valera Castle, ED Scribe. This patient was seen in room 3 and the patient's care was started at 1:30 PM.  HPI Danielle Chase is a 52 y.o. female who presents to the [facility] complaining of UTI symptoms, including dysuria, urinary frequency, and urinary urgency, onset 1 week ago. She states that yesterday has been the worst regarding the symptoms. She reports that she was up 2+ times last night having to go to the bathroom. She states the dysuria feels like a pulling sensation, with associated cramp-like pain, but reports having relief when she does urinate. She reports associated suprapubic pain that radiates across in a band like pattern. She denies h/o kidney stones and hysterectomy. She denies hematuria, constipation, diarrhea, vaginal bleeding, vaginal discharge, and any other associated symptoms.   Dr. Bernita Buffy - OB/GYN   Patient Active Problem List   Diagnosis Date Noted  . Diabetes mellitus 08/03/2011  . Hypertension 08/03/2011  . Hyperlipidemia 08/03/2011   Past Medical History  Diagnosis Date  . Diabetes mellitus without complication   . Hypertension   . Hyperlipidemia    Past Surgical History  Procedure Laterality Date  . Cholecystectomy    . Tubal ligation     No Known Allergies Prior to Admission medications   Medication Sig Start Date End Date Taking? Authorizing Provider  aspirin 81 MG tablet Take 81 mg by mouth daily.   Yes Historical Provider, MD  DULoxetine (CYMBALTA) 20 MG capsule TAKE ONE CAPSULE BY MOUTH DAILY 10/23/12  Yes Collene Gobble, MD  fluticasone (FLONASE) 50 MCG/ACT nasal spray Place 2 sprays into the nose daily. 08/22/12  Yes Collene Gobble, MD  hydrochlorothiazide (HYDRODIURIL) 25 MG tablet TAKE 1 TABLET BY MOUTH EVERY DAY 02/18/13  Yes Collene Gobble, MD  losartan (COZAAR) 50 MG tablet TAKE 1 TABLET BY MOUTH  DAILY 09/20/12  Yes Collene Gobble, MD  metFORMIN (GLUCOPHAGE-XR) 500 MG 24 hr tablet Take 1 tablet (500 mg total) by mouth daily with breakfast. 02/18/13  Yes Collene Gobble, MD  Multiple Vitamins tablet TAKE 1 TABLET BY MOUTH DAILY 08/24/12  Yes Collene Gobble, MD  propranolol ER (INDERAL LA) 60 MG 24 hr capsule TAKE 1 CAPSULE BY MOUTH EVERY DAY 02/18/13  Yes Collene Gobble, MD  simvastatin (ZOCOR) 40 MG tablet TAKE 1/2 TABLET BY MOUTH EVERY NIGHT AT BEDTIME 02/18/13  Yes Collene Gobble, MD  ciprofloxacin (CIPRO) 250 MG tablet Take 1 tablet (250 mg total) by mouth 2 (two) times daily. 11/24/12   Sondra Barges, PA-C    Review of Systems  Gastrointestinal: Negative for diarrhea, constipation and blood in stool.  Genitourinary: Positive for dysuria, urgency, frequency, difficulty urinating and pelvic pain. Negative for hematuria, vaginal bleeding, vaginal discharge, vaginal pain and menstrual problem.      Objective:   Physical Exam Nursing note and vitals reviewed. Constitutional: Pt is oriented to person, place, and time. Pt appears well-developed and well-nourished. No distress.  HENT:  Head: Normocephalic and atraumatic.  Eyes: EOM are normal. Pupils are equal, round, and reactive to light.  Neck: Neck supple. No tracheal deviation present.  Cardiovascular: Normal rate, regular rhythm and normal heart sounds.  Exam reveals no gallop and no friction rub.   No murmur heard. Pulmonary/Chest: Effort normal and breath sounds normal. No respiratory  distress. Pt has no wheezes. Pt has no rales.  Abdominal: Musculoskeletal: Normal range of motion.  Neurological: Pt is alert and oriented to person, place, and time.  Skin: Skin is warm and dry.  Psychiatric: Pt has a normal mood and affect. Pt's behavior is normal.    Triage Vitals: BP 112/60  Pulse 74  Temp(Src) 98 F (36.7 C) (Oral)  Resp 16  Ht 5\' 5"  (1.651 m)  Wt 240 lb (108.863 kg)  BMI 39.94 kg/m2  SpO2 98%  Results for orders placed in  visit on 02/19/13  POCT UA - MICROSCOPIC ONLY      Result Value Range   WBC, Ur, HPF, POC 13-18     RBC, urine, microscopic 2-3     Bacteria, U Microscopic 1+     Mucus, UA positive     Epithelial cells, urine per micros 4-6     Crystals, Ur, HPF, POC negative     Casts, Ur, LPF, POC negative     Yeast, UA negative    POCT URINALYSIS DIPSTICK      Result Value Range   Color, UA yellow     Clarity, UA clear     Glucose, UA negative     Bilirubin, UA negative     Ketones, UA negative     Spec Grav, UA 1.015     Blood, UA trace     pH, UA 7.0     Protein, UA negative     Urobilinogen, UA 0.2     Nitrite, UA negative     Leukocytes, UA Trace       [facility] reading (PRIMARY) by  Dr.Miray Mancino there is a suspicious 2 mm calcific density close to the left UVJ   Assessment & Plan:   She was given a strainer we'll treat with Cipro schedule CT urogram if symptoms persist    I personally performed the services described in this documentation, which was scribed in my presence. The recorded information has been reviewed and is accurate.

## 2013-02-19 NOTE — Patient Instructions (Signed)
Please strain your urine. Please call if he continued to have symptoms and we will schedule you for a CT

## 2013-06-05 ENCOUNTER — Encounter: Payer: Self-pay | Admitting: Emergency Medicine

## 2013-06-05 ENCOUNTER — Ambulatory Visit (INDEPENDENT_AMBULATORY_CARE_PROVIDER_SITE_OTHER): Payer: 59 | Admitting: Emergency Medicine

## 2013-06-05 VITALS — BP 110/70 | HR 71 | Temp 98.1°F | Resp 16 | Ht 65.0 in | Wt 247.0 lb

## 2013-06-05 DIAGNOSIS — I10 Essential (primary) hypertension: Secondary | ICD-10-CM

## 2013-06-05 DIAGNOSIS — E119 Type 2 diabetes mellitus without complications: Secondary | ICD-10-CM

## 2013-06-05 DIAGNOSIS — R5381 Other malaise: Secondary | ICD-10-CM

## 2013-06-05 DIAGNOSIS — R209 Unspecified disturbances of skin sensation: Secondary | ICD-10-CM | POA: Diagnosis not present

## 2013-06-05 DIAGNOSIS — G5600 Carpal tunnel syndrome, unspecified upper limb: Secondary | ICD-10-CM

## 2013-06-05 DIAGNOSIS — R200 Anesthesia of skin: Secondary | ICD-10-CM

## 2013-06-05 DIAGNOSIS — R5383 Other fatigue: Secondary | ICD-10-CM

## 2013-06-05 LAB — POCT CBC
GRANULOCYTE PERCENT: 61.5 % (ref 37–80)
HCT, POC: 43.4 % (ref 37.7–47.9)
Hemoglobin: 14.3 g/dL (ref 12.2–16.2)
Lymph, poc: 1.6 (ref 0.6–3.4)
MCH: 32.2 pg — AB (ref 27–31.2)
MCHC: 32.9 g/dL (ref 31.8–35.4)
MCV: 97.7 fL — AB (ref 80–97)
MID (CBC): 0.7 (ref 0–0.9)
MPV: 9.8 fL (ref 0–99.8)
POC Granulocyte: 3.7 (ref 2–6.9)
POC LYMPH PERCENT: 27.2 %L (ref 10–50)
POC MID %: 11.3 % (ref 0–12)
Platelet Count, POC: 191 10*3/uL (ref 142–424)
RBC: 4.44 M/uL (ref 4.04–5.48)
RDW, POC: 13.3 %
WBC: 6 10*3/uL (ref 4.6–10.2)

## 2013-06-05 LAB — TSH: TSH: 2.945 u[IU]/mL (ref 0.350–4.500)

## 2013-06-05 LAB — COMPLETE METABOLIC PANEL WITH GFR
ALT: 12 U/L (ref 0–35)
AST: 17 U/L (ref 0–37)
Albumin: 3.9 g/dL (ref 3.5–5.2)
Alkaline Phosphatase: 54 U/L (ref 39–117)
BILIRUBIN TOTAL: 1 mg/dL (ref 0.2–1.2)
BUN: 17 mg/dL (ref 6–23)
CALCIUM: 9.1 mg/dL (ref 8.4–10.5)
CHLORIDE: 101 meq/L (ref 96–112)
CO2: 29 meq/L (ref 19–32)
CREATININE: 0.99 mg/dL (ref 0.50–1.10)
GFR, EST AFRICAN AMERICAN: 75 mL/min
GFR, EST NON AFRICAN AMERICAN: 65 mL/min
GLUCOSE: 126 mg/dL — AB (ref 70–99)
Potassium: 3.8 mEq/L (ref 3.5–5.3)
Sodium: 141 mEq/L (ref 135–145)
Total Protein: 6.6 g/dL (ref 6.0–8.3)

## 2013-06-05 LAB — POCT GLYCOSYLATED HEMOGLOBIN (HGB A1C): Hemoglobin A1C: 5.2

## 2013-06-05 LAB — VITAMIN B12: VITAMIN B 12: 980 pg/mL — AB (ref 211–911)

## 2013-06-05 LAB — GLUCOSE, POCT (MANUAL RESULT ENTRY): POC Glucose: 121 mg/dl — AB (ref 70–99)

## 2013-06-05 LAB — MICROALBUMIN, URINE: Microalb, Ur: 1.5 mg/dL (ref 0.00–1.89)

## 2013-06-05 NOTE — Progress Notes (Addendum)
Subjective:    Patient ID: Danielle Chase, female    DOB: May 06, 1960, 53 y.o.   MRN: [id number] This chart was scribed for Darlyne Russian, MD by Anastasia Pall, ED Scribe. This patient was seen in room 12 and the patient's care was started at 9:26 AM.  Chief Complaint  Patient presents with  . Follow-up    Diabetes   HPI Danielle Chase is a 52 y.o. female Pt presents for DM follow up.   She reports intermittent, numbness/tingling in her left hand. She reports her symptoms are more frequent a night and states it will wake her up from time to time. 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   She denies checking her sugar levels at home regularly. She denies any symptoms with her feet.   She denies having flu immunization this year. She denies being interested in pneumonia and shingles immunizations. She opts for checking B-12 test.   She reports h/o snoring, but states with regular exercise her snoring has subsided significantly. She is not interested in sleep test currently, but is receptive to thinking about it.   PCP - Jenny Reichmann, MD  Patient Active Problem List   Diagnosis Date Noted  . Diabetes mellitus 08/03/2011  . Hypertension 08/03/2011  . Hyperlipidemia 08/03/2011   Past Medical History  Diagnosis Date  . Diabetes mellitus without complication   . Hypertension   . Hyperlipidemia    Past Surgical History  Procedure Laterality Date  . Cholecystectomy    . Tubal ligation     No Known Allergies Prior to Admission medications   Medication Sig Start Date End Date Taking? Authorizing Provider  aspirin 81 MG tablet Take 81 mg by mouth daily.    Historical Provider, MD  ciprofloxacin (CIPRO) 250 MG tablet Take 1 tablet (250 mg total) by mouth 2 (two) times daily. 02/19/13   Darlyne Russian, MD  DULoxetine (CYMBALTA) 20 MG capsule TAKE ONE CAPSULE BY MOUTH DAILY 10/23/12   Darlyne Russian, MD  fluticasone (FLONASE) 50 MCG/ACT nasal spray Place 2 sprays into the nose  daily. 08/22/12   Darlyne Russian, MD  hydrochlorothiazide (HYDRODIURIL) 25 MG tablet TAKE 1 TABLET BY MOUTH EVERY DAY 02/18/13   Darlyne Russian, MD  losartan (COZAAR) 50 MG tablet TAKE 1 TABLET BY MOUTH DAILY 09/20/12   Darlyne Russian, MD  metFORMIN (GLUCOPHAGE-XR) 500 MG 24 hr tablet Take 1 tablet (500 mg total) by mouth daily with breakfast. 02/18/13   Darlyne Russian, MD  Multiple Vitamins tablet TAKE 1 TABLET BY MOUTH DAILY 08/24/12   Darlyne Russian, MD  propranolol ER (INDERAL LA) 60 MG 24 hr capsule TAKE 1 CAPSULE BY MOUTH EVERY DAY 02/18/13   Darlyne Russian, MD  simvastatin (ZOCOR) 40 MG tablet TAKE 1/2 TABLET BY MOUTH EVERY NIGHT AT BEDTIME 02/18/13   Darlyne Russian, MD   Review of Systems  Cardiovascular: Negative for leg swelling.  Neurological: Positive for numbness (intermittent numbness and tingling in left hand). Negative for weakness.      Objective:   Physical Exam Nursing note and vitals reviewed. Constitutional: Pt is oriented to person, place, and time. Pt appears well-developed and well-nourished. No distress.  HENT: Right TM nl. Left TM nl. Oropharynx clear and moist, no exudate. Nose nl.  Head: Normocephalic and atraumatic.  Eyes: EOM are normal. Pupils are equal, round, and reactive to light.  Neck: Neck supple. No thyromegaly. No cervical adenopathy.  Cardiovascular: Normal rate, regular rhythm and normal heart sounds.  Exam reveals no gallop and no friction rub. No murmur heard. Distal Pulses intact.  Pulmonary/Chest: Effort normal and breath sounds normal. No respiratory distress. Pt has no wheezes. Pt has no rales.  Abdominal: Soft. Bowel sounds are normal. There is no tenderness. There is no rebound and no guarding. No hepatosplenomegaly. No CVA tenderness.  Musculoskeletal: Normal range of motion. No tenderness. No edema.   Neurological: Pt is alert and oriented to person, place, and time. Sensation to bilateral feet intact.  Skin: Skin is warm and dry.  Psychiatric: Pt has a  normal mood and affect. Pt's behavior is normal.  With forced compression over the median nerve of the left wrist patient does experience numbness in the thumb and index finger. Tinel's was negative. There is no weakness of the left hand pulses are 2+ and symmetrical there is a 2 second capillary fill noted. BP 110/70  Pulse 71  Temp(Src) 98.1 F (36.7 C) (Oral)  Resp 16  Ht 5\' 5"  (1.651 m)  Wt 247 lb (112.038 kg)  BMI 41.10 kg/m2  SpO2 95% Results for orders placed in visit on 06/05/13  POCT CBC      Result Value Ref Range   WBC 6.0  4.6 - 10.2 K/uL   Lymph, poc 1.6  0.6 - 3.4   POC LYMPH PERCENT 27.2  10 - 50 %L   MID (cbc) 0.7  0 - 0.9   POC MID % 11.3  0 - 12 %M   POC Granulocyte 3.7  2 - 6.9   Granulocyte percent 61.5  37 - 80 %G   RBC 4.44  4.04 - 5.48 M/uL   Hemoglobin 14.3  12.2 - 16.2 g/dL   HCT, POC 43.4  37.7 - 47.9 %   MCV 97.7 (*) 80 - 97 fL   MCH, POC 32.2 (*) 27 - 31.2 pg   MCHC 32.9  31.8 - 35.4 g/dL   RDW, POC 13.3     Platelet Count, POC 191  142 - 424 K/uL   MPV 9.8  0 - 99.8 fL  GLUCOSE, POCT (MANUAL RESULT ENTRY)      Result Value Ref Range   POC Glucose 121 (*) 70 - 99 mg/dl  POCT GLYCOSYLATED HEMOGLOBIN (HGB A1C)      Result Value Ref Range   Hemoglobin A1C 5.2        Assessment & Plan:  Last hemoglobin A1c was 5.1. Blood pressure is under control. She does have symptoms of left carpal tunnel. We'll give her a splint to wear at night offer her a referral to orthopedics if she desires. She declined a pneumonia vaccine today she declined a prescription for her shingles vaccine.   I personally performed the services described in this documentation, which was scribed in my presence. The recorded information has been reviewed and is accurate.  **Disclaimer: This note was dictated with voice recognition software. Similar sounding words can inadvertently be transcribed and this note may contain transcription errors which may not have been corrected upon  publication of note.**

## 2013-07-18 ENCOUNTER — Other Ambulatory Visit: Payer: Self-pay | Admitting: Emergency Medicine

## 2013-09-03 ENCOUNTER — Other Ambulatory Visit: Payer: Self-pay | Admitting: Emergency Medicine

## 2013-09-18 ENCOUNTER — Encounter: Payer: Self-pay | Admitting: Emergency Medicine

## 2013-09-18 ENCOUNTER — Other Ambulatory Visit: Payer: Self-pay | Admitting: Emergency Medicine

## 2013-09-18 ENCOUNTER — Ambulatory Visit (INDEPENDENT_AMBULATORY_CARE_PROVIDER_SITE_OTHER): Payer: 59 | Admitting: Emergency Medicine

## 2013-09-18 VITALS — BP 116/57 | HR 62 | Temp 97.9°F | Resp 16 | Ht 65.0 in | Wt 255.0 lb

## 2013-09-18 DIAGNOSIS — E119 Type 2 diabetes mellitus without complications: Secondary | ICD-10-CM

## 2013-09-18 DIAGNOSIS — E785 Hyperlipidemia, unspecified: Secondary | ICD-10-CM

## 2013-09-18 DIAGNOSIS — R55 Syncope and collapse: Secondary | ICD-10-CM

## 2013-09-18 DIAGNOSIS — I10 Essential (primary) hypertension: Secondary | ICD-10-CM

## 2013-09-18 LAB — COMPLETE METABOLIC PANEL WITH GFR
ALBUMIN: 3.9 g/dL (ref 3.5–5.2)
ALT: 19 U/L (ref 0–35)
AST: 18 U/L (ref 0–37)
Alkaline Phosphatase: 50 U/L (ref 39–117)
BUN: 14 mg/dL (ref 6–23)
CO2: 26 mEq/L (ref 19–32)
Calcium: 9 mg/dL (ref 8.4–10.5)
Chloride: 103 mEq/L (ref 96–112)
Creat: 0.98 mg/dL (ref 0.50–1.10)
GFR, Est African American: 76 mL/min
GFR, Est Non African American: 66 mL/min
Glucose, Bld: 114 mg/dL — ABNORMAL HIGH (ref 70–99)
POTASSIUM: 4 meq/L (ref 3.5–5.3)
Sodium: 138 mEq/L (ref 135–145)
Total Bilirubin: 1 mg/dL (ref 0.2–1.2)
Total Protein: 7.1 g/dL (ref 6.0–8.3)

## 2013-09-18 LAB — LIPID PANEL
CHOL/HDL RATIO: 3 ratio
CHOLESTEROL: 124 mg/dL (ref 0–200)
HDL: 41 mg/dL (ref >39)
LDL Cholesterol: 61 mg/dL (ref 0–99)
Triglycerides: 108 mg/dL (ref <150)
VLDL: 22 mg/dL (ref 0–40)

## 2013-09-18 LAB — GLUCOSE, POCT (MANUAL RESULT ENTRY): POC Glucose: 137 mg/dl — AB (ref 70–99)

## 2013-09-18 LAB — POCT GLYCOSYLATED HEMOGLOBIN (HGB A1C): HEMOGLOBIN A1C: 5.7

## 2013-09-18 NOTE — Progress Notes (Signed)
   Subjective:    Patient ID: Danielle Chase, female    DOB: 1960/12/03, 53 y.o.   MRN: [id number]  HPI patient here to followup on her diabetes, high blood pressure, and  Hyperlipidemia. Since her last visit here she has had 2 episodes which lasted about 1-2 minutes where she became weak somewhat dizzy headed felt somewhat faint and sweaty. These episodes resolved on their own. They were not associated with any chest pain shortness of breath or other symptoms . Overall she's been doing well she has not been exercising as much he should feels well.    Review of Systems     Objective:   Physical Exam  Constitutional: She is oriented to person, place, and time. She appears well-developed and well-nourished.  HENT:  Head: Normocephalic.  Eyes: Pupils are equal, round, and reactive to light.  Neck: No tracheal deviation present. No thyromegaly present.  Cardiovascular: Normal rate, regular rhythm and normal heart sounds.   Pulmonary/Chest: Effort normal and breath sounds normal.  Abdominal: Soft.  Neurological: She is alert and oriented to person, place, and time. No cranial nerve deficit.  Psychiatric: She has a normal mood and affect. Her behavior is normal. Judgment and thought content normal.  EKG normal Results for orders placed in visit on 09/18/13  GLUCOSE, POCT (MANUAL RESULT ENTRY)      Result Value Ref Range   POC Glucose 137 (*) 70 - 99 mg/dl  POCT GLYCOSYLATED HEMOGLOBIN (HGB A1C)      Result Value Ref Range   Hemoglobin A1C 5.7           Assessment & Plan:  Her exam is normal today. Her spells sound more like hypoglycemia. Her blood pressure and pulse are both normal at the present time. She had a cardiac workup in 2008 which included an echocardiogram which was normal. I do feel like she should have repeat evaluation because of her multiple cardiac risk factors

## 2013-09-27 ENCOUNTER — Other Ambulatory Visit: Payer: Self-pay | Admitting: Emergency Medicine

## 2013-09-27 NOTE — Telephone Encounter (Signed)
Dr [name], you just saw pt for med refills but don't see this med discussed. Can we give RFs?

## 2013-10-24 ENCOUNTER — Other Ambulatory Visit: Payer: Self-pay

## 2013-10-24 DIAGNOSIS — Z1231 Encounter for screening mammogram for malignant neoplasm of breast: Secondary | ICD-10-CM

## 2013-10-29 ENCOUNTER — Ambulatory Visit
Admission: RE | Admit: 2013-10-29 | Discharge: 2013-10-29 | Disposition: A | Discharge status: Home / Self Care | Payer: 59 | Source: Ambulatory Visit

## 2013-10-29 DIAGNOSIS — Z1231 Encounter for screening mammogram for malignant neoplasm of breast: Secondary | ICD-10-CM

## 2013-10-31 ENCOUNTER — Encounter: Payer: Self-pay | Admitting: Cardiology

## 2013-10-31 ENCOUNTER — Ambulatory Visit (INDEPENDENT_AMBULATORY_CARE_PROVIDER_SITE_OTHER): Payer: 59 | Admitting: Cardiology

## 2013-10-31 VITALS — BP 132/82 | HR 76 | Ht 65.0 in | Wt 261.0 lb

## 2013-10-31 DIAGNOSIS — E7800 Pure hypercholesterolemia, unspecified: Secondary | ICD-10-CM

## 2013-10-31 DIAGNOSIS — IMO0001 Reserved for inherently not codable concepts without codable children: Secondary | ICD-10-CM

## 2013-10-31 DIAGNOSIS — E1165 Type 2 diabetes mellitus with hyperglycemia: Secondary | ICD-10-CM

## 2013-10-31 DIAGNOSIS — R42 Dizziness and giddiness: Secondary | ICD-10-CM

## 2013-10-31 NOTE — Patient Instructions (Signed)
Your physician recommends that you continue on your current medications as directed. Please refer to the Current Medication list given to you today.  Follow up as needed  

## 2013-10-31 NOTE — Progress Notes (Signed)
[name] Date of Birth:  1960-07-21 [place] [address] [place], [place]  [address] [phone]        Fax   [phone]   History of Present Illness: This pleasant 53 year old woman is seen by me for the first time in the office today.  She is seen at the request of Dr. [name].  She is being seen for evaluation of dizzy spells.  She has had 2 dizzy spells prior to being seen by Dr. [name] on 09/18/2013.  Each of the dizzy spells lasted only 1-2 minutes and were not severe.  She has never fainted.  She thinks that they may have been related to mild hypoglycemia. She has a history of essential hypertension and a history of diabetes.  She does not have any history of ischemic heart disease or exertional chest pain.  She had an echocardiogram in 2008 which showed normal left ventricular systolic function with an ejection fraction of 60% and no significant valve abnormalities.  She walks regularly at work.  She takes to walk breaks from her job.  She sits at a computer for 10 twice a day she goes for a three-quarter of a mile walk.  She tolerates the exercise without difficulty. She had an EKG in June 2015 which was normal She has a history of coronary artery disease in her father's side.  He died at 63 of a heart attack.  Her mother died of emphysema.  The patient herself is a smoker but quit 12 years  Current Outpatient Prescriptions  Medication Sig Dispense Refill  . aspirin 81 MG tablet Take 81 mg by mouth daily.      . DULoxetine (CYMBALTA) 20 MG capsule TAKE 1 CAPSULE BY MOUTH EVERY DAY  30 capsule  2  . fluticasone (FLONASE) 50 MCG/ACT nasal spray INSTILL 2 SPRAYS IN EACH NOSTRIL DAILY  16 g  11  . hydrochlorothiazide (HYDRODIURIL) 25 MG tablet TAKE 1 TABLET BY MOUTH EVERY DAY  30 tablet  5  . losartan (COZAAR) 50 MG tablet TAKE 1 TABLET BY MOUTH DAILY  30 tablet  1  . metFORMIN (GLUCOPHAGE-XR) 500 MG 24 hr tablet TAKE 1 TABLET BY MOUTH DAILY WITH  BREAKFAST  30 tablet  5  . Multiple Vitamins tablet TAKE 1 TABLET BY MOUTH DAILY  30 tablet  5  . propranolol ER (INDERAL LA) 60 MG 24 hr capsule TAKE 1 CAPSULES BY MOUTH DAILY  30 capsule  5  . simvastatin (ZOCOR) 40 MG tablet TAKE 1/2 TABLET BY MOUTH DAILY AT BEDTIME  15 tablet  5   No current facility-administered medications for this visit.    No Known Allergies  Patient Active Problem List   Diagnosis Date Noted  . Dizziness 10/31/2013  . Carpal tunnel syndrome 06/05/2013  . Diabetes mellitus 08/03/2011  . Hypertension 08/03/2011  . Hyperlipidemia 08/03/2011    History  Smoking status  . Former Smoker  . Types: Cigarettes  Smokeless tobacco  . Never Used    Comment: quit 12 yrs ago    History  Alcohol Use  . Yes    Comment: socially    Family History  Problem Relation Age of Onset  . COPD Mother   . Heart disease Father   . Cancer Sister     Review of Systems: Constitutional: no fever chills diaphoresis or fatigue or change in weight.  Head and neck: no hearing loss, no epistaxis, no photophobia or visual disturbance. Respiratory:  No cough, shortness of breath or wheezing. Cardiovascular: No chest pain peripheral edema, palpitations. Gastrointestinal: No abdominal distention, no abdominal pain, no change in bowel habits hematochezia or melena. Genitourinary: No dysuria, no frequency, no urgency, no nocturia. Musculoskeletal:No arthralgias, no back pain, no gait disturbance or myalgias. Neurological: No dizziness, no headaches, no numbness, no seizures, no syncope, no weakness, no tremors. Hematologic: No lymphadenopathy, no easy bruising. Psychiatric: No confusion, no hallucinations, no sleep disturbance.    Physical Exam: Filed Vitals:   10/31/13 1528  BP: 132/82  Pulse: 76   general appearance reveals a well-developed somewhat overweight woman in no acute distress.The head and neck exam reveals pupils equal and reactive.  Extraocular movements are  full.  There is no scleral icterus.  The mouth and pharynx are normal.  The neck is supple.  The carotids reveal no bruits.  The jugular venous pressure is normal.  The  thyroid is not enlarged.  There is no lymphadenopathy.  The chest is clear to percussion and auscultation.  There are no rales or rhonchi.  Expansion of the chest is symmetrical.  The precordium is quiet.  The first heart sound is normal.  The second heart sound is physiologically split.  There is no murmur gallop rub or click.  There is no abnormal lift or heave.  The abdomen is soft and nontender.  The bowel sounds are normal.  The liver and spleen are not enlarged.  There are no abdominal masses.  There are no abdominal bruits.  Extremities reveal good pedal pulses.  There is no phlebitis or edema.  There is no cyanosis or clubbing.  Strength is normal and symmetrical in all extremities.  There is no lateralizing weakness.  There are no sensory deficits.  The skin is warm and dry.  There is no rash.  EKG from June 2015 was reviewed and was normal.   Assessment / Plan: A diagnostic impression is that the patient has had 2 very brief episodes of dizziness.  She has had no symptoms now in more than a month.  She tends to minimize the symptoms.  She felt like he might have been from mild hypoglycemia.  The episodes lasted only 1-2 minutes and resolved on their own Her cardiac evaluation today is normal.  At this point the patient and I agreed that we did not need to do any further cardiac testing at this point.  If her symptoms recur or become more severe then further testing may be of benefit such as a 30 day event monitor.  She states that she would let you or me know if her symptoms recur. Many thanks for the opportunity to see this pleasant woman with you.

## 2013-11-11 ENCOUNTER — Other Ambulatory Visit: Payer: Self-pay | Admitting: Emergency Medicine

## 2014-01-15 ENCOUNTER — Ambulatory Visit (INDEPENDENT_AMBULATORY_CARE_PROVIDER_SITE_OTHER): Payer: 59 | Admitting: Emergency Medicine

## 2014-01-15 ENCOUNTER — Encounter: Payer: Self-pay | Admitting: Emergency Medicine

## 2014-01-15 VITALS — BP 142/74 | HR 84 | Temp 98.0°F | Resp 16 | Ht 65.0 in | Wt 264.4 lb

## 2014-01-15 DIAGNOSIS — E119 Type 2 diabetes mellitus without complications: Secondary | ICD-10-CM

## 2014-01-15 DIAGNOSIS — I10 Essential (primary) hypertension: Secondary | ICD-10-CM

## 2014-01-15 DIAGNOSIS — Z23 Encounter for immunization: Secondary | ICD-10-CM

## 2014-01-15 DIAGNOSIS — E785 Hyperlipidemia, unspecified: Secondary | ICD-10-CM

## 2014-01-15 LAB — LIPID PANEL
Cholesterol: 123 mg/dL (ref 0–200)
HDL: 40 mg/dL (ref >39)
LDL Cholesterol: 59 mg/dL (ref 0–99)
Total CHOL/HDL Ratio: 3.1 Ratio
Triglycerides: 122 mg/dL (ref <150)
VLDL: 24 mg/dL (ref 0–40)

## 2014-01-15 LAB — COMPREHENSIVE METABOLIC PANEL
ALBUMIN: 3.9 g/dL (ref 3.5–5.2)
ALT: 22 U/L (ref 0–35)
AST: 18 U/L (ref 0–37)
Alkaline Phosphatase: 55 U/L (ref 39–117)
BUN: 17 mg/dL (ref 6–23)
CO2: 29 mEq/L (ref 19–32)
CREATININE: 1.14 mg/dL — AB (ref 0.50–1.10)
Calcium: 8.9 mg/dL (ref 8.4–10.5)
Chloride: 103 mEq/L (ref 96–112)
Glucose, Bld: 143 mg/dL — ABNORMAL HIGH (ref 70–99)
Potassium: 3.9 mEq/L (ref 3.5–5.3)
Sodium: 140 mEq/L (ref 135–145)
Total Bilirubin: 0.9 mg/dL (ref 0.2–1.2)
Total Protein: 7 g/dL (ref 6.0–8.3)

## 2014-01-15 LAB — POCT GLYCOSYLATED HEMOGLOBIN (HGB A1C): HEMOGLOBIN A1C: 5.9

## 2014-01-15 LAB — GLUCOSE, POCT (MANUAL RESULT ENTRY): POC Glucose: 145 mg/dl — AB (ref 70–99)

## 2014-01-15 NOTE — Patient Instructions (Signed)
RTC 3-4 months

## 2014-01-15 NOTE — Progress Notes (Signed)
Subjective:    Patient ID: Danielle Chase, female    DOB: June 29, 1960, 53 y.o.   MRN: [id number]  HPI Patient presents for a 3 month follow up for diabetes, hypertension, and dyslipidemia. Since she was here last patient says she has been well. Dizzy spells have resolved and cardiologist did not find any abnormalities according to patient. She does not check BP or glucose at home and has a neg ROS. Up to date on eye exam (Jan. 2015). She has been compliant on all medication and would like to start weaning off of Cymbalta. Reports moods as stable. Has not felt anxious or depressed. Reports diet lately as poor with increased salty foods and salami sandwiches. She does drink 4 16 oz of water a day and 2 cups of black coffee. Her gym closed for remodeling for the past 3 months so she has not been working out. Gym re-opens this week so will resume soon.    Review of Systems  Constitutional: Negative for fever, activity change, appetite change and fatigue.  HENT: Negative for congestion, ear pain, rhinorrhea, sneezing and sore throat.   Eyes: Negative for pain, discharge and itching.  Respiratory: Negative for cough, chest tightness and shortness of breath.   Cardiovascular: Negative for chest pain, palpitations and leg swelling.  Gastrointestinal: Negative for nausea, vomiting, abdominal pain, diarrhea, constipation and abdominal distention.  Genitourinary: Negative for dysuria, urgency and frequency.  Musculoskeletal: Negative for arthralgias and myalgias.  Skin: Negative for rash.  Allergic/Immunologic: Negative for environmental allergies and food allergies.  Neurological: Negative for syncope, light-headedness and headaches.  Hematological: Negative for adenopathy.  Psychiatric/Behavioral: Negative for behavioral problems, dysphoric mood and agitation. The patient is not nervous/anxious.        Objective:   Physical Exam  Constitutional: She is oriented to person, place, and time. She  appears well-developed and well-nourished. No distress.  HENT:  Head: Normocephalic and atraumatic.  Right Ear: External ear normal.  Left Ear: External ear normal.  Mouth/Throat: Oropharynx is clear and moist. No oropharyngeal exudate.  Eyes: Conjunctivae and EOM are normal. Pupils are equal, round, and reactive to light. Right eye exhibits no discharge. Left eye exhibits no discharge. No scleral icterus.  Neck: Neck supple. No JVD present. No thyromegaly present.  Cardiovascular: Normal rate, regular rhythm and intact distal pulses.  Exam reveals no gallop and no friction rub.   No murmur heard. Pulmonary/Chest: Effort normal and breath sounds normal. No stridor. She has no wheezes. She has no rales.  Abdominal: Soft. Bowel sounds are normal. She exhibits no distension. There is no tenderness.  Musculoskeletal: She exhibits no edema and no tenderness.  Lymphadenopathy:    She has no cervical adenopathy.  Neurological: She is oriented to person, place, and time.  Skin: Skin is warm and dry. No rash noted. She is not diaphoretic. No erythema. No pallor.  Psychiatric: She has a normal mood and affect. Her behavior is normal. Judgment and thought content normal.   Blood pressure 142/74, pulse 84, temperature 98 F (36.7 C), temperature source Oral, resp. rate 16, height 5\' 5"  (1.651 m), weight 264 lb 6.4 oz (119.931 kg), SpO2 97.00%.   Results for orders placed in visit on 01/15/14  POCT GLYCOSYLATED HEMOGLOBIN (HGB A1C)      Result Value Ref Range   Hemoglobin A1C 5.9    GLUCOSE, POCT (MANUAL RESULT ENTRY)      Result Value Ref Range   POC Glucose 145 (*) 70 - 99 mg/dl  Assessment & Plan:   1. Type 2 diabetes mellitus without complication GU5K increasing gradually over past year.  - POCT glycosylated hemoglobin (Hb A1C) - POCT glucose (manual entry) - Comprehensive metabolic panel - [facility] Diabetes Foot Exam - Encouraged patient to make better diet decisions and return to  exercise regimen. Discussed importance of weight management with DM and HTN.  2. Dyslipidemia - Lipid panel  3. Essential hypertension Elevated.  - Comprehensive metabolic panel - Encouraged patient to make better diet decisions and return to exercise regimen.  -   4. Need for prophylactic vaccination and inoculation against influenza - Flu Vaccine QUAD 36+ mos IM   Lanyia Jewel PA-C  [facility] 01/15/2014 9:27 AM

## 2014-01-29 ENCOUNTER — Ambulatory Visit (HOSPITAL_COMMUNITY)
Admission: RE | Admit: 2014-01-29 | Discharge: 2014-01-29 | Disposition: A | Discharge status: Home / Self Care | Payer: 59 | Source: Ambulatory Visit | Attending: Physician Assistant | Admitting: Physician Assistant

## 2014-01-29 ENCOUNTER — Ambulatory Visit (INDEPENDENT_AMBULATORY_CARE_PROVIDER_SITE_OTHER): Payer: 59 | Admitting: Physician Assistant

## 2014-01-29 VITALS — BP 128/76 | HR 61 | Temp 98.7°F | Resp 18 | Ht 65.0 in | Wt 264.6 lb

## 2014-01-29 DIAGNOSIS — R1032 Left lower quadrant pain: Secondary | ICD-10-CM | POA: Insufficient documentation

## 2014-01-29 DIAGNOSIS — R102 Pelvic and perineal pain: Secondary | ICD-10-CM

## 2014-01-29 DIAGNOSIS — D72829 Elevated white blood cell count, unspecified: Secondary | ICD-10-CM

## 2014-01-29 DIAGNOSIS — K5792 Diverticulitis of intestine, part unspecified, without perforation or abscess without bleeding: Secondary | ICD-10-CM

## 2014-01-29 DIAGNOSIS — K5730 Diverticulosis of large intestine without perforation or abscess without bleeding: Secondary | ICD-10-CM | POA: Insufficient documentation

## 2014-01-29 DIAGNOSIS — R3915 Urgency of urination: Secondary | ICD-10-CM

## 2014-01-29 LAB — POCT URINALYSIS DIPSTICK
Bilirubin, UA: NEGATIVE
Glucose, UA: 100
KETONES UA: NEGATIVE
Leukocytes, UA: NEGATIVE
Nitrite, UA: NEGATIVE
PH UA: 7
PROTEIN UA: NEGATIVE
RBC UA: NEGATIVE
SPEC GRAV UA: 1.01
Urobilinogen, UA: 0.2

## 2014-01-29 LAB — POCT CBC
GRANULOCYTE PERCENT: 71.5 % (ref 37–80)
HCT, POC: 43.1 % (ref 37.7–47.9)
Hemoglobin: 14.9 g/dL (ref 12.2–16.2)
Lymph, poc: 2.4 (ref 0.6–3.4)
MCH, POC: 33.7 pg — AB (ref 27–31.2)
MCHC: 34.5 g/dL (ref 31.8–35.4)
MCV: 97.7 fL — AB (ref 80–97)
MID (CBC): 1.1 — AB (ref 0–0.9)
MPV: 7.9 fL (ref 0–99.8)
PLATELET COUNT, POC: 196 10*3/uL (ref 142–424)
POC Granulocyte: 8.9 — AB (ref 2–6.9)
POC LYMPH PERCENT: 19.5 %L (ref 10–50)
POC MID %: 9 %M (ref 0–12)
RBC: 4.41 M/uL (ref 4.04–5.48)
RDW, POC: 13.5 %
WBC: 12.4 10*3/uL — AB (ref 4.6–10.2)

## 2014-01-29 LAB — POCT WET PREP WITH KOH
KOH PREP POC: NEGATIVE
RBC Wet Prep HPF POC: NEGATIVE
TRICHOMONAS UA: NEGATIVE
YEAST WET PREP PER HPF POC: NEGATIVE

## 2014-01-29 LAB — POCT UA - MICROSCOPIC ONLY
CRYSTALS, UR, HPF, POC: NEGATIVE
Casts, Ur, LPF, POC: NEGATIVE
Mucus, UA: NEGATIVE
Yeast, UA: NEGATIVE

## 2014-01-29 MED ORDER — METRONIDAZOLE 250 MG PO TABS: 250.0000 mg | ORAL_TABLET | Freq: Three times a day (TID) | ORAL | Status: DC

## 2014-01-29 MED ORDER — IOHEXOL 300 MG/ML  SOLN
100.0000 mL | Freq: Once | INTRAMUSCULAR | Status: AC | PRN
  Administered 2014-01-29: 100 mL via INTRAVENOUS

## 2014-01-29 MED ORDER — MELOXICAM 7.5 MG PO TABS: ORAL_TABLET | ORAL | Status: AC

## 2014-01-29 MED ORDER — CIPROFLOXACIN HCL 500 MG PO TABS: 500.0000 mg | ORAL_TABLET | Freq: Two times a day (BID) | ORAL | Status: DC

## 2014-01-29 NOTE — Progress Notes (Signed)
IDENTIFYING INFORMATION  Danielle Chase / DOB: [birth year] / MRN: [id number]  The patient has Diabetes mellitus; Hypertension; Hyperlipidemia; Carpal tunnel syndrome; and Diverticular disease of left colon on her problem list.  SUBJECTIVE  Chief Complaint: Urinary Urgency, Abdominal Pain and Urinary Frequency   History of present illness: Danielle Chase is a 53 y.o. year old female who presents for urinary urgency, and frequency, however she denies dysuria.   This began 10 days ago.  Associated symptoms include suprapubic cramping that goes across the lower abdomen, but is particularly painful in the left lower quadrant.  She reports no significant change in stooling habits, and denies blood during defecation.  She denies fever, chills, diaphoresis, and presyncope.  She rates the severity of her symptoms 6/10. She has tried Azo witout relief.  She reports this is just like a UTI for her, aside from the cramping.    She  has a past medical history of Diabetes mellitus without complication; Hypertension; and Hyperlipidemia..  The patient has a current medication list which includes the following prescription(s): aspirin, duloxetine, fluticasone, hydrochlorothiazide, losartan, metformin, multiple vitamins, propranolol er, simvastatin, ciprofloxacin, meloxicam, and metronidazole..  Danielle Chase has No Known Allergies.. She  reports that she quit smoking about 12 years ago. Her smoking use included Cigarettes. She smoked 0.00 packs per day. She has never used smokeless tobacco. She reports that she does not drink alcohol or use illicit drugs. and she  reports that she currently engages in sexual activity.  The patient  has past surgical history that includes Cholecystectomy and Tubal ligation.Marland Kitchen  Her family history includes COPD in her mother; Cancer in her sister; Heart disease in her father.  Review of Systems  Constitutional: Negative.   HENT: Negative.   Eyes: Negative.   Respiratory: Negative.     Cardiovascular: Negative.   Gastrointestinal: Positive for abdominal pain (left lower quadrant). Negative for nausea, vomiting, diarrhea, constipation and blood in stool.  Genitourinary: Positive for urgency and frequency. Negative for dysuria, hematuria and flank pain.  Musculoskeletal: Negative.   Skin: Negative.   Neurological: Negative.      OBJECTIVE  Blood pressure 128/76, pulse 61, temperature 98.7 F (37.1 C), temperature source Oral, resp. rate 18, height 5\' 5"  (1.651 m), weight 264 lb 9.6 oz (120.022 kg), SpO2 97.00%. The patient's body mass index is 44.03 kg/(m^2).  Physical Exam  Vitals reviewed. Constitutional: She is oriented to person, place, and time and well-developed, well-nourished, and in no distress. No distress.  HENT:  Head: Normocephalic.  Neck: Normal range of motion. No thyromegaly present.  Cardiovascular: Regular rhythm.   Pulmonary/Chest: Effort normal. No respiratory distress.  Abdominal: Soft. Normal appearance and bowel sounds are normal. She exhibits no distension and no mass. There is tenderness in the left lower quadrant. There is no rebound, no guarding, no CVA tenderness and no tenderness at McBurney's point.  Genitourinary: Uterus normal, cervix normal and vulva normal. Right adnexum displays no mass. Left adnexum displays no mass. Thin  creamy  musty  white and vaginal discharge found.  Musculoskeletal: Normal range of motion.  Neurological: She is alert and oriented to person, place, and time.  Skin: Skin is warm and dry. No rash noted. No erythema. No pallor.  Psychiatric: Mood, memory, affect and judgment normal.    Results for orders placed in visit on 01/29/14 (from the past 24 hour(s))  POCT UA - MICROSCOPIC ONLY     Status: None   Collection Time  01/29/14  2:22 PM      Result Value Ref Range   WBC, Ur, HPF, POC 4-13     RBC, urine, microscopic 0-1     Bacteria, U Microscopic trace     Mucus, UA neg     Epithelial cells, urine  per micros 4-13     Crystals, Ur, HPF, POC neg     Casts, Ur, LPF, POC neg     Yeast, UA neg    POCT URINALYSIS DIPSTICK     Status: None   Collection Time    01/29/14  2:22 PM      Result Value Ref Range   Color, UA yellow     Clarity, UA hazy     Glucose, UA 100     Bilirubin, UA neg     Ketones, UA neg     Spec Grav, UA 1.010     Blood, UA neg     pH, UA 7.0     Protein, UA neg     Urobilinogen, UA 0.2     Nitrite, UA neg     Leukocytes, UA Negative    POCT WET PREP WITH KOH     Status: None   Collection Time    01/29/14  3:46 PM      Result Value Ref Range   Trichomonas, UA Negative     Clue Cells Wet Prep HPF POC 0-2     Epithelial Wet Prep HPF POC 4-8     Yeast Wet Prep HPF POC neg     Bacteria Wet Prep HPF POC trace     RBC Wet Prep HPF POC neg     WBC Wet Prep HPF POC 15-20     KOH Prep POC Negative    POCT CBC     Status: Abnormal   Collection Time    01/29/14  3:46 PM      Result Value Ref Range   WBC 12.4 (*) 4.6 - 10.2 K/uL   Lymph, poc 2.4  0.6 - 3.4   POC LYMPH PERCENT 19.5  10 - 50 %L   MID (cbc) 1.1 (*) 0 - 0.9   POC MID % 9.0  0 - 12 %M   POC Granulocyte 8.9 (*) 2 - 6.9   Granulocyte percent 71.5  37 - 80 %G   RBC 4.41  4.04 - 5.48 M/uL   Hemoglobin 14.9  12.2 - 16.2 g/dL   HCT, POC 43.1  37.7 - 47.9 %   MCV 97.7 (*) 80 - 97 fL   MCH, POC 33.7 (*) 27 - 31.2 pg   MCHC 34.5  31.8 - 35.4 g/dL   RDW, POC 13.5     Platelet Count, POC 196  142 - 424 K/uL   MPV 7.9  0 - 99.8 fL   EXAM:  CT ABDOMEN AND PELVIS WITH CONTRAST  TECHNIQUE:   FINDINGS:  There are streaky artifacts from patient's large body habitus.  Sagittal images of the spine are unremarkable. Atherosclerotic  calcifications of abdominal aorta and iliac arteries. No aortic  aneurysm. The patient is status postcholecystectomy. Mild hepatic  fatty infiltration. Pancreas, spleen and adrenal glands are  unremarkable.  Kidneys are symmetrical in size and enhancement. No hydronephrosis    or hydroureter.  No small bowel obstruction. There is no pericecal inflammation. No  ascites or free air. No adenopathy. Normal appendix.  Axial image 62 there is abnormal thickening of proximal sigmoid  colon in left lower quadrant.  A inflamed colonic diverticulum is  noted at this level with thickening of diverticular wall and  stranding of surrounding fat. Findings are consistent with acute  diverticulitis. Few diverticula are noted proximal sigmoid colon.  The uterus and adnexa are unremarkable. No distal colonic  obstruction.  Delayed renal images shows bilateral renal symmetrical excretion.  Bilateral visualized proximal ureter is unremarkable.   IMPRESSION:  1. There is abnormal focal thickening of proximal sigmoid colon wall  in left lower quadrant. There is a colonic inflamed diverticulum in  axial image 62 with stranding of surrounding fat. Findings are  consistent with acute diverticulitis. No extraluminal contrast  material is noted.  2. Normal appendix. No pericecal inflammation.  3. Fatty infiltration of the liver. Status postcholecystectomy.  4. No hydronephrosis or hydroureter.  Electronically Signed  By: Lahoma Crocker M.D.  On: 01/29/2014 18:59   ASSESSMENT & PLAN  Jearlean was seen today for urinary urgency, abdominal pain and urinary frequency. Her UA and wet prep were within normal limits.  Her white count and abdominal pain were suspicious for an intraabdominal/pelvic process.  Further evaluation via CT was ordered.  The findings are included above.  Spoke with patient after CT scan and informed her that her antibiotics would be called into the pharmacy.  Offered opioid pain medication to the patient and she asked for a "lighter" medication. Patient is amenable to medication plan outlined below.     Diagnoses and associated orders for this visit:  Acute diverticulitis - ciprofloxacin (CIPRO) 500 MG tablet; Take 1 tablet (500 mg total) by mouth 2 (two) times  daily. - metroNIDAZOLE (FLAGYL) 250 MG tablet; Take 1 tablet (250 mg total) by mouth 3 (three) times daily. - meloxicam (MOBIC) 7.5 MG tablet; Do not take more than 15 mg in 24 hours.  Urinary urgency - POCT UA - Microscopic Only - POCT urinalysis dipstick - POCT Wet Prep with KOH  Elevated white blood cell count - CT Abdomen Pelvis W Contrast  LLQ abdominal pain - POCT CBC - CT Abdomen Pelvis W Contrast  The patient was instructed to to call or comeback to clinic as needed, or should symptoms warrant.  Philis Fendt, MHS, PA-C [facility] 01/29/2014 7:35 PM

## 2014-01-31 NOTE — Progress Notes (Signed)
I was directly involved with the patient's care and agree with the physical, diagnosis and treatment plan.  

## 2014-03-16 ENCOUNTER — Other Ambulatory Visit: Payer: Self-pay | Admitting: Emergency Medicine

## 2014-04-12 HISTORY — PX: COLONOSCOPY: SHX174

## 2014-04-12 HISTORY — PX: POLYPECTOMY: SHX149

## 2014-05-14 ENCOUNTER — Encounter: Payer: Self-pay | Admitting: Emergency Medicine

## 2014-05-14 ENCOUNTER — Ambulatory Visit (INDEPENDENT_AMBULATORY_CARE_PROVIDER_SITE_OTHER): Payer: 59 | Admitting: Emergency Medicine

## 2014-05-14 VITALS — BP 112/74 | HR 79 | Temp 97.7°F | Resp 16 | Ht 64.75 in | Wt 266.2 lb

## 2014-05-14 DIAGNOSIS — E118 Type 2 diabetes mellitus with unspecified complications: Secondary | ICD-10-CM

## 2014-05-14 DIAGNOSIS — Z1211 Encounter for screening for malignant neoplasm of colon: Secondary | ICD-10-CM

## 2014-05-14 LAB — POCT GLYCOSYLATED HEMOGLOBIN (HGB A1C): Hemoglobin A1C: 5.8

## 2014-05-14 LAB — GLUCOSE, POCT (MANUAL RESULT ENTRY): POC Glucose: 170 mg/dl — AB (ref 70–99)

## 2014-05-14 NOTE — Progress Notes (Addendum)
   Subjective:    Patient ID: Danielle Chase, female    DOB: 10-May-1960, 54 y.o.   MRN: [id number] This chart was scribed for Arlyss Queen, MD by Zola Button, Medical Scribe. This patient was seen in room 21 and the patient's care was started at 9:24 AM.   HPI HPI Comments: QUANESHA KLIMASZEWSKI is a 54 y.o. female with a hx of DM and HTN who presents to the [facility] for a follow-up. She had a recent episode of diverticulitis. She was referred to cardiology in June 2015 for near syncope; she saw Dr. Mare Ferrari.   Patient has not had any problems with regards to her recent diverticulitis. She has never had a colonoscopy and is hesitant to have one. Patient denies FMHx of colon cancer. She has been getting mammograms. She states she has been trying to watch her diet, but has not been exercising much. Patient does not check her blood sugar.    Review of Systems     Objective:   Physical Exam CONSTITUTIONAL: Obese HEAD: Normocephalic/atraumatic EYES: EOM/PERRL ENMT: Mucous membranes moist NECK: supple no meningeal signs SPINE: entire spine nontender CV: S1/S2 noted, no murmurs/rubs/gallops noted LUNGS: Lungs are clear to auscultation bilaterally, no apparent distress ABDOMEN: soft, nontender, no rebound or guarding. No masses. GU: no cva tenderness NEURO: Pt is awake/alert, moves all extremitiesx4 EXTREMITIES: pulses normal, full ROM SKIN: warm, color normal PSYCH: no abnormalities of mood noted Results for orders placed or performed in visit on 05/14/14  POCT glucose (manual entry)  Result Value Ref Range   POC Glucose 170 (A) 70 - 99 mg/dl  POCT glycosylated hemoglobin (Hb A1C)  Result Value Ref Range   Hemoglobin A1C 5.8          Assessment & Plan:  Sugar is great. Referral made for colonoscopy. Her cholesterols have been very low. We'll recheck in 3 months and check her lipid panel see Matt and urine microalbumin at that time. She is agreeable to start on  an exercise program once the weather improves. We need to continue to work on weight loss.I personally performed the services described in this documentation, which was scribed in my presence. The recorded information has been reviewed and is accurate her next appointment will be for a physical. She does have a GYN doctor who keeps her GYN status up-to-date.Marland Kitchen

## 2014-05-16 ENCOUNTER — Encounter: Payer: Self-pay | Admitting: Internal Medicine

## 2014-06-15 ENCOUNTER — Other Ambulatory Visit: Payer: Self-pay | Admitting: Emergency Medicine

## 2014-06-21 ENCOUNTER — Ambulatory Visit (AMBULATORY_SURGERY_CENTER): Payer: Self-pay | Admitting: *Deleted

## 2014-06-21 VITALS — Ht 64.0 in | Wt 267.8 lb

## 2014-06-21 DIAGNOSIS — Z1211 Encounter for screening for malignant neoplasm of colon: Secondary | ICD-10-CM

## 2014-06-21 MED ORDER — MOVIPREP 100 G PO SOLR: 1.0000 | Freq: Once | ORAL | Status: DC

## 2014-06-21 NOTE — Progress Notes (Signed)
No egg or soy allergy No issues with past sedation No home 02  No diet pills emmi video to e mail

## 2014-07-12 ENCOUNTER — Ambulatory Visit (AMBULATORY_SURGERY_CENTER): Payer: 59 | Admitting: Internal Medicine

## 2014-07-12 ENCOUNTER — Encounter: Payer: Self-pay | Admitting: Internal Medicine

## 2014-07-12 VITALS — BP 109/67 | HR 63 | Temp 98.5°F | Resp 24 | Ht 64.0 in | Wt 267.0 lb

## 2014-07-12 DIAGNOSIS — Z1211 Encounter for screening for malignant neoplasm of colon: Secondary | ICD-10-CM

## 2014-07-12 DIAGNOSIS — D122 Benign neoplasm of ascending colon: Secondary | ICD-10-CM

## 2014-07-12 LAB — GLUCOSE, CAPILLARY
GLUCOSE-CAPILLARY: 113 mg/dL — AB (ref 70–99)
GLUCOSE-CAPILLARY: 111 mg/dL — AB (ref 70–99)

## 2014-07-12 MED ORDER — SODIUM CHLORIDE 0.9 % IV SOLN: 500.0000 mL | INTRAVENOUS | Status: DC

## 2014-07-12 NOTE — Patient Instructions (Signed)
YOU HAD AN ENDOSCOPIC PROCEDURE TODAY AT [facility]:   Refer to the procedure report that was given to you for any specific questions about what was found during the examination.  If the procedure report does not answer your questions, please call your gastroenterologist to clarify.  If you requested that your care partner not be given the details of your procedure findings, then the procedure report has been included in a sealed envelope for you to review at your convenience later.  YOU SHOULD EXPECT: Some feelings of bloating in the abdomen. Passage of more gas than usual.  Walking can help get rid of the air that was put into your GI tract during the procedure and reduce the bloating. If you had a lower endoscopy (such as a colonoscopy or flexible sigmoidoscopy) you may notice spotting of blood in your stool or on the toilet paper. If you underwent a bowel prep for your procedure, you may not have a normal bowel movement for a few days.  Please Note:  You might notice some irritation and congestion in your nose or some drainage.  This is from the oxygen used during your procedure.  There is no need for concern and it should clear up in a day or so.  SYMPTOMS TO REPORT IMMEDIATELY:   Following lower endoscopy (colonoscopy or flexible sigmoidoscopy):  Excessive amounts of blood in the stool  Significant tenderness or worsening of abdominal pains  Swelling of the abdomen that is new, acute  Fever of 100F or higher  For urgent or emergent issues, a gastroenterologist can be reached at any hour by calling [phone].  DIET: Your first meal following the procedure should be a small meal and then it is ok to progress to your normal diet. Heavy or fried foods are harder to digest and may make you feel nauseous or bloated.  Likewise, meals heavy in dairy and vegetables can increase bloating.  Drink plenty of fluids but you should avoid alcoholic beverages for 24 hours.  ACTIVITY:   You should plan to take it easy for the rest of today and you should NOT DRIVE or use heavy machinery until tomorrow (because of the sedation medicines used during the test).    FOLLOW UP: Our staff will call the number listed on your records the next business day following your procedure to check on you and address any questions or concerns that you may have regarding the information given to you following your procedure. If we do not reach you, we will leave a message.  However, if you are feeling well and you are not experiencing any problems, there is no need to return our call.  We will assume that you have returned to your regular daily activities without incident.  If any biopsies were taken you will be contacted by phone or by letter within the next 1-3 weeks.  Please call us at [phone] if you have not heard about the biopsies in 3 weeks.   SIGNATURES/CONFIDENTIALITY: You and/or your care partner have signed paperwork which will be entered into your electronic medical record.  These signatures attest to the fact that that the information above on your After Visit Summary has been reviewed and is understood.  Full responsibility of the confidentiality of this discharge information lies with you and/or your care-partner.  Await pathology  Continue your normal medications  Please read over handout polyps and high fiber diets

## 2014-07-12 NOTE — Progress Notes (Signed)
Called to room to assist during endoscopic procedure.  Patient ID and intended procedure confirmed with present staff. Received instructions for my participation in the procedure from the performing physician.  

## 2014-07-12 NOTE — Op Note (Signed)
[facility]  [address] [place], [phone]   COLONOSCOPY PROCEDURE REPORT  PATIENT: Danielle Chase, Danielle Chase  MR#: [id number] BIRTHDATE: 1960/07/08 , 53  yrs. old GENDER: female ENDOSCOPIST: Lafayette Dragon, MD REFERRED CX:KGYJEH Daub, M.D. PROCEDURE DATE:  07/12/2014 PROCEDURE:   Colonoscopy, screening and Colonoscopy with cold biopsy polypectomy First Screening Colonoscopy - Avg.  risk and is 50 yrs.  old or older Yes.  Prior Negative Screening - Now for repeat screening. N/A  History of Adenoma - Now for follow-up colonoscopy & has been > or = to 3 yrs.  N/A ASA CLASS:   Class II INDICATIONS:Screening for colonic neoplasia and Colorectal Neoplasm Risk Assessment for this procedure is average risk. MEDICATIONS: Monitored anesthesia care and Propofol 300 mg IV  DESCRIPTION OF PROCEDURE:   After the risks benefits and alternatives of the procedure were thoroughly explained, informed consent was obtained.  The digital rectal exam revealed no abnormalities of the rectum.   The LB [id number]  endoscope was introduced through the anus and advanced to the cecum, which was identified by both the appendix and ileocecal valve. No adverse events experienced.   The quality of the prep was good.  (MoviPrep was used)  The instrument was then slowly withdrawn as the colon was fully examined.      COLON FINDINGS: A sessile polyp measuring 4 mm in size was found in the ascending colon.  A polypectomy was performed with cold forceps. there were a few scattered diverticuli in the sigmoid colon The resection was complete, the polyp tissue was completely retrieved and sent to histology.  Retroflexed views revealed no abnormalities. The time to cecum = 10.53 Withdrawal time = 7.19 The scope was withdrawn and the procedure completed. COMPLICATIONS: There were no immediate complications.  ENDOSCOPIC IMPRESSION: Sessile polyp was found in the ascending colon; polypectomy  was performed with cold forceps Few scattered diverticuli sigmoid colon  RECOMMENDATIONS: 1.  Await pathology results 2.  High-fiber diet 3. Recall colonoscopy pending path report  eSigned:  Lafayette Dragon, MD 07/12/2014 8:53 AM   cc:   PATIENT NAME:  Danielle Chase MR#: [id number]

## 2014-07-12 NOTE — Progress Notes (Signed)
Report to PACU, RN, vss, BBS= Clear.  

## 2014-07-14 ENCOUNTER — Other Ambulatory Visit: Payer: Self-pay | Admitting: Emergency Medicine

## 2014-07-15 ENCOUNTER — Telehealth: Payer: Self-pay

## 2014-07-15 NOTE — Telephone Encounter (Signed)
  Follow up Call-  Call back number 07/12/2014  Post procedure Call Back phone  # [phone]  Permission to leave phone message Yes     Patient questions:  Do you have a fever, pain , or abdominal swelling? No. Pain Score  0 *  Have you tolerated food without any problems? Yes.    Have you been able to return to your normal activities? Yes.    Do you have any questions about your discharge instructions: Diet   No. Medications  No. Follow up visit  No.  Do you have questions or concerns about your Care? No.  Actions: * If pain score is 4 or above: No action needed, pain <4.

## 2014-07-16 ENCOUNTER — Encounter: Payer: Self-pay | Admitting: Internal Medicine

## 2014-07-18 ENCOUNTER — Other Ambulatory Visit: Payer: Self-pay

## 2014-07-18 MED ORDER — HYDROCHLOROTHIAZIDE 25 MG PO TABS: 25.0000 mg | ORAL_TABLET | Freq: Every day | ORAL | Status: DC

## 2014-08-01 ENCOUNTER — Telehealth: Payer: Self-pay | Admitting: Family Medicine

## 2014-08-01 NOTE — Telephone Encounter (Signed)
lmom to call and reschedule appt from 08/27/14 to 08/13/14 Dr [name] will be out of clinic in the morning  on 08/27/14

## 2014-08-13 ENCOUNTER — Encounter: Payer: Self-pay | Admitting: Emergency Medicine

## 2014-08-13 ENCOUNTER — Ambulatory Visit (INDEPENDENT_AMBULATORY_CARE_PROVIDER_SITE_OTHER): Payer: 59 | Admitting: Emergency Medicine

## 2014-08-13 VITALS — BP 121/85 | HR 57 | Temp 98.0°F | Resp 16 | Ht 65.5 in | Wt 271.0 lb

## 2014-08-13 DIAGNOSIS — I10 Essential (primary) hypertension: Secondary | ICD-10-CM | POA: Diagnosis not present

## 2014-08-13 DIAGNOSIS — R5382 Chronic fatigue, unspecified: Secondary | ICD-10-CM | POA: Diagnosis not present

## 2014-08-13 DIAGNOSIS — E785 Hyperlipidemia, unspecified: Secondary | ICD-10-CM

## 2014-08-13 DIAGNOSIS — Z0000 Encounter for general adult medical examination without abnormal findings: Secondary | ICD-10-CM | POA: Diagnosis not present

## 2014-08-13 DIAGNOSIS — E118 Type 2 diabetes mellitus with unspecified complications: Secondary | ICD-10-CM | POA: Diagnosis not present

## 2014-08-13 LAB — CBC WITH DIFFERENTIAL/PLATELET
Basophils Absolute: 0.1 10*3/uL (ref 0.0–0.1)
Basophils Relative: 1 % (ref 0–1)
Eosinophils Absolute: 0.2 10*3/uL (ref 0.0–0.7)
Eosinophils Relative: 4 % (ref 0–5)
HCT: 42.7 % (ref 36.0–46.0)
Hemoglobin: 14.5 g/dL (ref 12.0–15.0)
LYMPHS ABS: 1.3 10*3/uL (ref 0.7–4.0)
LYMPHS PCT: 23 % (ref 12–46)
MCH: 32.1 pg (ref 26.0–34.0)
MCHC: 34 g/dL (ref 30.0–36.0)
MCV: 94.5 fL (ref 78.0–100.0)
MONOS PCT: 13 % — AB (ref 3–12)
MPV: 10.3 fL (ref 8.6–12.4)
Monocytes Absolute: 0.8 10*3/uL (ref 0.1–1.0)
NEUTROS PCT: 59 % (ref 43–77)
Neutro Abs: 3.4 10*3/uL (ref 1.7–7.7)
Platelets: 213 10*3/uL (ref 150–400)
RBC: 4.52 MIL/uL (ref 3.87–5.11)
RDW: 13.2 % (ref 11.5–15.5)
WBC: 5.8 10*3/uL (ref 4.0–10.5)

## 2014-08-13 LAB — LIPID PANEL
Cholesterol: 128 mg/dL (ref 0–200)
HDL: 39 mg/dL — ABNORMAL LOW (ref >=46)
LDL Cholesterol: 62 mg/dL (ref 0–99)
Total CHOL/HDL Ratio: 3.3 Ratio
Triglycerides: 133 mg/dL (ref <150)
VLDL: 27 mg/dL (ref 0–40)

## 2014-08-13 LAB — COMPLETE METABOLIC PANEL WITH GFR
ALT: 18 U/L (ref 0–35)
AST: 18 U/L (ref 0–37)
Albumin: 3.9 g/dL (ref 3.5–5.2)
Alkaline Phosphatase: 51 U/L (ref 39–117)
BUN: 16 mg/dL (ref 6–23)
CO2: 30 mEq/L (ref 19–32)
Calcium: 8.9 mg/dL (ref 8.4–10.5)
Chloride: 101 mEq/L (ref 96–112)
Creat: 1 mg/dL (ref 0.50–1.10)
GFR, Est African American: 74 mL/min
GFR, Est Non African American: 64 mL/min
Glucose, Bld: 134 mg/dL — ABNORMAL HIGH (ref 70–99)
Potassium: 4.1 mEq/L (ref 3.5–5.3)
Sodium: 139 mEq/L (ref 135–145)
Total Bilirubin: 0.9 mg/dL (ref 0.2–1.2)
Total Protein: 6.9 g/dL (ref 6.0–8.3)

## 2014-08-13 LAB — TSH: TSH: 2.036 u[IU]/mL (ref 0.350–4.500)

## 2014-08-13 LAB — POCT GLYCOSYLATED HEMOGLOBIN (HGB A1C): HEMOGLOBIN A1C: 5.9

## 2014-08-13 NOTE — Progress Notes (Addendum)
Subjective:  This chart was scribed for Darlyne Russian, MD by Ladene Artist, ED Scribe. The patient was seen in room 22. Patient's care was started at 9:06 AM.   Patient ID: Danielle Chase, female    DOB: 01-09-1961, 54 y.o.   MRN: [id number]  Chief Complaint  Patient presents with  . Annual Exam   HPI HPI Comments: Danielle Chase is a 54 y.o. female, with a h/o DM, HTN, hyperlipidemia, who presents to the [facility] for an annual exam. Pt states that she is doing well overall. She denies any health changes since she was last seen.   Fatigue Pt presents with daily fatigue that has been worse for the past few weeks. She states that she is "tired all the time". Pt has never had a sleep study done and does not desire to have one. She states that she will try to lose weight first to see if this improves her symptoms.   Weight Pt walks regularly for exercise. She states that she was able to lose weight when she was going to the gym regularly but gained weight when she stopped going. Pt states that she will consider joining [employer]. She denies tobacco or alcohol use.   Preventative Maintenance  Pt is followed by Paula Compton. Her mammogram and pap smears are UTD. Pt recently had her colonoscopy.   Immunizations  Pt has not had a pneumonia vaccine. She declines one at this time.   Family Hx Pt's oldest sister, age 50, has a h/o breast CA.   Pt plans to vacation in [facility] in July.    Past Medical History  Diagnosis Date  . Diabetes mellitus without complication   . Hypertension   . Hyperlipidemia   . Allergy    Current Outpatient Prescriptions on File Prior to Visit  Medication Sig Dispense Refill  . aspirin 81 MG tablet Take 81 mg by mouth daily.    . DULoxetine (CYMBALTA) 20 MG capsule TAKE 1 CAPSULE BY MOUTH EVERY DAY 30 capsule 2  . fluticasone (FLONASE) 50 MCG/ACT nasal spray INSTILL 2 SPRAYS IN EACH NOSTRIL DAILY 16 g 11  . hydrochlorothiazide  (HYDRODIURIL) 25 MG tablet Take 1 tablet (25 mg total) by mouth daily. 30 tablet 3  . losartan (COZAAR) 50 MG tablet TAKE 1 TABLET BY MOUTH DAILY. 30 tablet 1  . metFORMIN (GLUCOPHAGE-XR) 500 MG 24 hr tablet TAKE 1 TABLET BY MOUTH EVERY DAY WITH BREAKFAST 30 tablet 1  . Multiple Vitamins tablet TAKE 1 TABLET BY MOUTH DAILY 30 tablet 5  . PREMARIN vaginal cream   11  . propranolol ER (INDERAL LA) 60 MG 24 hr capsule TAKE 1 CAPSULE BY MOUTH EVERY DAY 30 capsule 1  . simvastatin (ZOCOR) 40 MG tablet TAKE 1/2 TABLET BY MOUTH EVERY NIGHT AT BEDTIME 15 tablet 0   No current facility-administered medications on file prior to visit.   No Known Allergies  Review of Systems  Constitutional: Positive for fatigue. Negative for fever and chills.  HENT: Negative.   Eyes: Negative.   Respiratory: Negative.   Cardiovascular: Negative.   Gastrointestinal: Negative.   Genitourinary: Negative.   Musculoskeletal: Negative.   Neurological: Negative.    BP 121/85 mmHg  Pulse 57  Temp(Src) 98 F (36.7 C)  Resp 16  Ht 5' 5.5" (1.664 m)  Wt 271 lb (122.925 kg)  BMI 44.39 kg/m2  SpO2 98%    Objective:   Physical Exam CONSTITUTIONAL: Well developed/well  nourished, significantly overweight  HEAD: Normocephalic/atraumatic EYES: EOMI/PERRL ENMT: Mucous membranes moist NECK: supple no meningeal signs SPINE/BACK:entire spine nontender CV: S1/S2 noted, no murmurs/rubs/gallops noted LUNGS: Lungs are clear to auscultation bilaterally, no apparent distress ABDOMEN: soft, nontender, no rebound or guarding, bowel sounds noted throughout abdomen GU:no cva tenderness NEURO: Pt is awake/alert/appropriate, moves all extremitiesx4.  No facial droop.   EXTREMITIES: pulses normal/equal, full ROM SKIN: warm, color normal PSYCH: no abnormalities of mood noted, alert and oriented to situation    Results for orders placed or performed in visit on 08/13/14  POCT glycosylated hemoglobin (Hb A1C)  Result Value Ref  Range   Hemoglobin A1C 5.9    Assessment & Plan:   1. Physical exam Within normal limits except for obesity. She is going to join [employer] and increase her time at the gym. - CBC with Differential/Platelet  2. Type 2 diabetes mellitus with complication Hemoglobin B7C at goal at 5.9. - COMPLETE METABOLIC PANEL WITH GFR - Microalbumin, urine - POCT glycosylated hemoglobin (Hb A1C) - EKG 12-Lead  3. Essential hypertension Blood pressure goal 125/85.  4. Hyperlipemia  - Lipid panel  5. Chronic fatigue  - TSH   I personally performed the services described in this documentation, which was scribed in my presence. The recorded information has been reviewed and is accurate.  Arlyss Queen, MD  [facility]  08/13/2014 2:57 PM

## 2014-08-14 LAB — MICROALBUMIN, URINE: Microalb, Ur: 0.7 mg/dL (ref <2.0)

## 2014-08-17 ENCOUNTER — Other Ambulatory Visit: Payer: Self-pay | Admitting: Physician Assistant

## 2014-08-18 NOTE — Telephone Encounter (Signed)
Dr. [name] she just had a physical 08/13/14 needs a refill on Zocor, can we refill for 6 months?  She already has a follow up with you in November

## 2014-08-27 ENCOUNTER — Encounter: Payer: 59 | Admitting: Emergency Medicine

## 2014-09-12 ENCOUNTER — Other Ambulatory Visit: Payer: Self-pay | Admitting: Physician Assistant

## 2014-09-12 ENCOUNTER — Other Ambulatory Visit: Payer: Self-pay | Admitting: Emergency Medicine

## 2014-09-13 NOTE — Telephone Encounter (Signed)
Dr [name], pt just had annual w/you, but don't see this discussed. OK to RF?

## 2014-11-03 ENCOUNTER — Telehealth: Payer: Self-pay | Admitting: Family Medicine

## 2014-11-03 NOTE — Telephone Encounter (Signed)
lmom to call and reschedule appt that she had with daub on 02/13/15 

## 2014-11-14 ENCOUNTER — Other Ambulatory Visit: Payer: Self-pay | Admitting: Emergency Medicine

## 2014-12-16 ENCOUNTER — Other Ambulatory Visit: Payer: Self-pay | Admitting: Emergency Medicine

## 2015-01-14 ENCOUNTER — Encounter: Payer: Self-pay | Admitting: Emergency Medicine

## 2015-01-15 ENCOUNTER — Other Ambulatory Visit: Payer: Self-pay | Admitting: Physician Assistant

## 2015-01-16 ENCOUNTER — Other Ambulatory Visit: Payer: Self-pay | Admitting: Emergency Medicine

## 2015-01-17 NOTE — Telephone Encounter (Signed)
Dr [name], pt seen in May for CPE, but allergies not discussed. OK to RF?

## 2015-02-11 ENCOUNTER — Other Ambulatory Visit: Payer: Self-pay | Admitting: Physician Assistant

## 2015-02-11 ENCOUNTER — Other Ambulatory Visit: Payer: Self-pay | Admitting: Emergency Medicine

## 2015-02-13 ENCOUNTER — Ambulatory Visit: Payer: 59 | Admitting: Emergency Medicine

## 2015-02-27 ENCOUNTER — Encounter: Payer: Self-pay | Admitting: Emergency Medicine

## 2015-02-27 ENCOUNTER — Ambulatory Visit (INDEPENDENT_AMBULATORY_CARE_PROVIDER_SITE_OTHER): Payer: Commercial Managed Care - HMO | Admitting: Emergency Medicine

## 2015-02-27 VITALS — BP 135/83 | HR 58 | Temp 98.0°F | Resp 16 | Ht 65.5 in | Wt 274.0 lb

## 2015-02-27 DIAGNOSIS — E669 Obesity, unspecified: Secondary | ICD-10-CM | POA: Diagnosis not present

## 2015-02-27 DIAGNOSIS — E781 Pure hyperglyceridemia: Secondary | ICD-10-CM | POA: Diagnosis not present

## 2015-02-27 DIAGNOSIS — E118 Type 2 diabetes mellitus with unspecified complications: Secondary | ICD-10-CM

## 2015-02-27 DIAGNOSIS — I10 Essential (primary) hypertension: Secondary | ICD-10-CM | POA: Diagnosis not present

## 2015-02-27 LAB — LIPID PANEL
CHOLESTEROL: 127 mg/dL (ref 125–200)
HDL: 38 mg/dL — ABNORMAL LOW (ref >=46)
LDL Cholesterol: 62 mg/dL (ref <130)
Total CHOL/HDL Ratio: 3.3 Ratio (ref <=5.0)
Triglycerides: 134 mg/dL (ref <150)
VLDL: 27 mg/dL (ref <30)

## 2015-02-27 LAB — HEMOGLOBIN A1C: Hgb A1c MFr Bld: 6.4 % — AB (ref 4.0–6.0)

## 2015-02-27 LAB — GLUCOSE, POCT (MANUAL RESULT ENTRY): POC GLUCOSE: 161 mg/dL — AB (ref 70–99)

## 2015-02-27 LAB — COMPLETE METABOLIC PANEL WITH GFR
ALT: 27 U/L (ref 6–29)
AST: 23 U/L (ref 10–35)
Albumin: 3.9 g/dL (ref 3.6–5.1)
Alkaline Phosphatase: 51 U/L (ref 33–130)
BUN: 15 mg/dL (ref 7–25)
CALCIUM: 9.4 mg/dL (ref 8.6–10.4)
CHLORIDE: 99 mmol/L (ref 98–110)
CO2: 30 mmol/L (ref 20–31)
CREATININE: 1 mg/dL (ref 0.50–1.05)
GFR, Est African American: 73 mL/min (ref >=60)
GFR, Est Non African American: 64 mL/min (ref >=60)
GLUCOSE: 146 mg/dL — AB (ref 65–99)
POTASSIUM: 4.1 mmol/L (ref 3.5–5.3)
Sodium: 140 mmol/L (ref 135–146)
TOTAL PROTEIN: 7 g/dL (ref 6.1–8.1)
Total Bilirubin: 1.1 mg/dL (ref 0.2–1.2)

## 2015-02-27 LAB — CBC WITH DIFFERENTIAL/PLATELET
BASOS PCT: 1 % (ref 0–1)
Basophils Absolute: 0.1 10*3/uL (ref 0.0–0.1)
EOS PCT: 4 % (ref 0–5)
Eosinophils Absolute: 0.2 10*3/uL (ref 0.0–0.7)
HCT: 44.2 % (ref 36.0–46.0)
Hemoglobin: 15.4 g/dL — ABNORMAL HIGH (ref 12.0–15.0)
Lymphocytes Relative: 25 % (ref 12–46)
Lymphs Abs: 1.4 10*3/uL (ref 0.7–4.0)
MCH: 32.9 pg (ref 26.0–34.0)
MCHC: 34.8 g/dL (ref 30.0–36.0)
MCV: 94.4 fL (ref 78.0–100.0)
MONO ABS: 0.7 10*3/uL (ref 0.1–1.0)
MPV: 10.4 fL (ref 8.6–12.4)
Monocytes Relative: 13 % — ABNORMAL HIGH (ref 3–12)
Neutro Abs: 3.2 10*3/uL (ref 1.7–7.7)
Neutrophils Relative %: 57 % (ref 43–77)
PLATELETS: 203 10*3/uL (ref 150–400)
RBC: 4.68 MIL/uL (ref 3.87–5.11)
RDW: 13.2 % (ref 11.5–15.5)
WBC: 5.7 10*3/uL (ref 4.0–10.5)

## 2015-02-27 LAB — POCT GLYCOSYLATED HEMOGLOBIN (HGB A1C): HEMOGLOBIN A1C: 6.4

## 2015-02-27 LAB — TSH: TSH: 2.321 u[IU]/mL (ref 0.350–4.500)

## 2015-02-27 NOTE — Progress Notes (Signed)
Patient ID: Danielle Chase, female   DOB: 12/11/1960, 54 y.o.   MRN: [id number]     This chart was scribed for Arlyss Queen, MD by Zola Button, Medical Scribe. This patient was seen in room 21 and the patient's care was started at 9:20 AM.   Chief Complaint:  Chief Complaint  Patient presents with  . Follow-up  . Diabetes    HPI: Danielle Chase is a 54 y.o. female who reports to [facility] today for a follow-up for diabetes. Patient has been doing well overall, but notes that she has gained some weight. She knows what she needs to do to lose weight, but notes she just has to get back on track. She takes the Glucophage-XR once a day.  She has been doing well on the Cymbalta. She has been taking this twice a week.  Health maintenance: Patient received her flu shot last month. She has not received any pneumonia vaccinations and is not interested at this time. She is followed by OB/GYN yearly. Her last colonoscopy was this past year in April.  Wt Readings from Last 3 Encounters:  02/27/15 274 lb (124.286 kg)  08/13/14 271 lb (122.925 kg)  07/12/14 267 lb (121.11 kg)     Past Medical History  Diagnosis Date  . Diabetes mellitus without complication ([place])   . Hypertension   . Hyperlipidemia   . Allergy    Past Surgical History  Procedure Laterality Date  . Cholecystectomy    . Tubal ligation    . Cesarean section      x1   Social History   Social History  . Marital Status: Married    Spouse Name: N/A  . Number of Children: N/A  . Years of Education: N/A   Social History Main Topics  . Smoking status: Former Smoker    Types: Cigarettes    Quit date: 05/01/2001  . Smokeless tobacco: Never Used     Comment: quit 12 yrs ago  . Alcohol Use: 0.0 oz/week    0 Standard drinks or equivalent per week     Comment: socially  . Drug Use: No  . Sexual Activity:    Partners: Male   Other Topics Concern  . None   Social History Narrative   Family History  Problem Relation Age  of Onset  . COPD Mother   . Heart disease Father   . Cancer Sister   . Colon cancer Neg Hx   . Rectal cancer Neg Hx   . Stomach cancer Neg Hx   . Diabetes Brother    No Known Allergies Prior to Admission medications   Medication Sig Start Date End Date Taking? Authorizing Provider  aspirin 81 MG tablet Take 81 mg by mouth daily.    Historical Provider, MD  DULoxetine (CYMBALTA) 20 MG capsule TAKE ONE CAPSULE BY MOUTH EVERY DAY 09/13/14   Darlyne Russian, MD  fluticasone [facility]) 50 MCG/ACT nasal spray INSTILL 2 SPRAYS IN EACH NOSTRIL DAILY 01/17/15   Darlyne Russian, MD  hydrochlorothiazide (HYDRODIURIL) 25 MG tablet TAKE 1 TABLET BY MOUTH EVERY DAY 02/12/15   Darlyne Russian, MD  losartan (COZAAR) 50 MG tablet TAKE 1 TABLET BY MOUTH DAILY 09/13/14   Darlyne Russian, MD  metFORMIN (GLUCOPHAGE-XR) 500 MG 24 hr tablet TAKE 1 TABLET BY MOUTH EVERY DAY WITH BREAKFAST.  "OFFICE VISIT NEEDED FOR REFILLS" 02/11/15   Harrison Mons, PA-C  Multiple Vitamins tablet TAKE 1 TABLET BY MOUTH DAILY 08/24/12  Darlyne Russian, MD  PREMARIN vaginal cream  04/04/14   Historical Provider, MD  propranolol ER (INDERAL LA) 60 MG 24 hr capsule TAKE 1 CAPSULE BY MOUTH EVERY DAY 09/13/14   Darlyne Russian, MD  simvastatin (ZOCOR) 40 MG tablet TAKE 1/2 TABLET BY MOUTH EVERY NIGHT AT BEDTIME 09/13/14   Darlyne Russian, MD     ROS: The patient denies fevers, chills, night sweats, unintentional weight loss, chest pain, palpitations, wheezing, dyspnea on exertion, nausea, vomiting, abdominal pain, dysuria, hematuria, melena, numbness, weakness, or tingling.   All other systems have been reviewed and were otherwise negative with the exception of those mentioned in the HPI and as above.    PHYSICAL EXAM: Filed Vitals:   02/27/15 0859  BP: 135/83  Pulse: 58  Temp: 98 F (36.7 C)  Resp: 16   Body mass index is 44.89 kg/(m^2).   General: Alert, no acute distress. Morbidly obese HEENT:  Normocephalic, atraumatic, oropharynx  patent. Eye: [name] [facility] Cardiovascular:  Regular rate and rhythm, no rubs murmurs or gallops.  No Carotid bruits, radial pulse intact. No pedal edema.  Respiratory: Clear to auscultation bilaterally.  No wheezes, rales, or rhonchi.  No cyanosis, no use of accessory musculature Abdominal: No organomegaly, abdomen is soft and non-tender, positive bowel sounds.  No masses. Musculoskeletal: Gait intact. No edema, tenderness Skin: No rashes. Neurologic: Facial musculature symmetric. Psychiatric: Patient acts appropriately throughout our interaction. Lymphatic: No cervical or submandibular lymphadenopathy    LABS:    EKG/XRAY:   Primary read interpreted by Dr. [name] at [facility].   ASSESSMENT/PLAN: Hemoglobin A1c is up to 6.4. This is related to diet and weight gain. No change in medications at present. Recheck 4 months.  By signing my name below, I, Zola Button, attest that this documentation has been prepared under the direction and in the presence of Arlyss Queen, MD.  Electronically Signed: Zola Button, Medical Scribe. 02/27/2015. 9:20 AM.   Gross sideeffects, risk and benefits, and alternatives of medications d/w patient. Patient is aware that all medications have potential sideeffects and we are unable to predict every sideeffect or drug-drug interaction that may occur.  Arlyss Queen MD 02/27/2015 9:20 AM

## 2015-02-27 NOTE — Patient Instructions (Signed)
Obesity Obesity is defined as having too much total body fat and a body mass index (BMI) of 30 or more. BMI is an estimate of body fat and is calculated from your height and weight. BMI is typically calculated by your health care provider during regular wellness visits. Obesity happens when you consume more calories than you can burn by exercising or performing daily physical tasks. Prolonged obesity can cause major illnesses or emergencies, such as:  Stroke.  Heart disease.  Diabetes.  Cancer.  Arthritis.  High blood pressure (hypertension).  High cholesterol.  Sleep apnea.  Erectile dysfunction.  Infertility problems. CAUSES   Regularly eating unhealthy foods.  Physical inactivity.  Certain disorders, such as an underactive thyroid (hypothyroidism), Cushing's syndrome, and polycystic ovarian syndrome.  Certain medicines, such as steroids, some depression medicines, and antipsychotics.  Genetics.  Lack of sleep. DIAGNOSIS A health care provider can diagnose obesity after calculating your BMI. Obesity will be diagnosed if your BMI is 30 or higher. There are other methods of measuring obesity levels. Some other methods include measuring your skinfold thickness, your waist circumference, and comparing your hip circumference to your waist circumference. TREATMENT  A healthy treatment program includes some or all of the following:  Long-term dietary changes.  Exercise and physical activity.  Behavioral and lifestyle changes.  Medicine only under the supervision of your health care provider. Medicines may help, but only if they are used with diet and exercise programs. If your BMI is 40 or higher, your health care provider may recommend specialized surgery or programs to help with weight loss. An unhealthy treatment program includes:  Fasting.  Fad diets.  Supplements and drugs. These choices do not succeed in long-term weight control. HOME CARE  INSTRUCTIONS  Exercise and perform physical activity as directed by your health care provider. To increase physical activity, try the following:  Use stairs instead of elevators.  Park farther away from store entrances.  Garden, bike, or walk instead of watching television or using the computer.  Eat healthy, low-calorie foods and drinks on a regular basis. Eat more fruits and vegetables. Use low-calorie cookbooks or take healthy cooking classes.  Limit fast food, sweets, and processed snack foods.  Eat smaller portions.  Keep a daily journal of everything you eat. There are many free websites to help you with this. It may be helpful to measure your foods so you can determine if you are eating the correct portion sizes.  Avoid drinking alcohol. Drink more water and drinks without calories.  Take vitamins and supplements only as recommended by your health care provider.  Weight-loss support groups, registered dietitians, counselors, and stress reduction education can also be very helpful. SEEK IMMEDIATE MEDICAL CARE IF:  You have chest pain or tightness.  You have trouble breathing or feel short of breath.  You have weakness or leg numbness.  You feel confused or have trouble talking.  You have sudden changes in your vision.   This information is not intended to replace advice given to you by your health care provider. Make sure you discuss any questions you have with your health care provider.   Document Released: 05/06/2004 Document Revised: 04/19/2014 Document Reviewed: 05/05/2011 Elsevier Interactive Patient Education 2016 [employer]  

## 2015-03-13 ENCOUNTER — Encounter: Payer: Self-pay | Admitting: Family Medicine

## 2015-03-16 ENCOUNTER — Other Ambulatory Visit: Payer: Self-pay | Admitting: Physician Assistant

## 2015-03-17 ENCOUNTER — Other Ambulatory Visit: Payer: Self-pay | Admitting: Emergency Medicine

## 2015-05-05 LAB — HM DIABETES EYE EXAM

## 2015-05-11 ENCOUNTER — Other Ambulatory Visit: Payer: Self-pay | Admitting: Emergency Medicine

## 2015-05-19 ENCOUNTER — Encounter: Payer: Self-pay | Admitting: Family Medicine

## 2015-05-24 IMAGING — CT CT ABD-PELV W/ CM
2 of 5 series · 17 of 46 positions shown, 19 images · IV contrast (omnipaque)
Comparison: 03/07/2004

CLINICAL DATA: Progressive left lower quadrant pain for 3 days
possible diverticulitis

EXAM:
CT ABDOMEN AND PELVIS WITH CONTRAST
TECHNIQUE: Multidetector CT imaging of the abdomen and pelvis was performed
using the standard protocol following bolus administration of
intravenous contrast.
CONTRAST:  100mL OMNIPAQUE IOHEXOL 300 MG/ML  SOLN

[Series 2: rtn a/p with · axial · 0.89mm/px · z∈[-282,+123]mm · 14 of 91 slices shown, 16 images]
[im 5/91  soft-tissue]
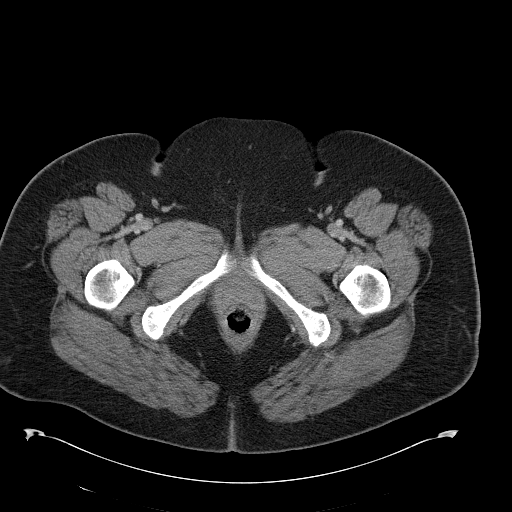
[im 5/91  bone]
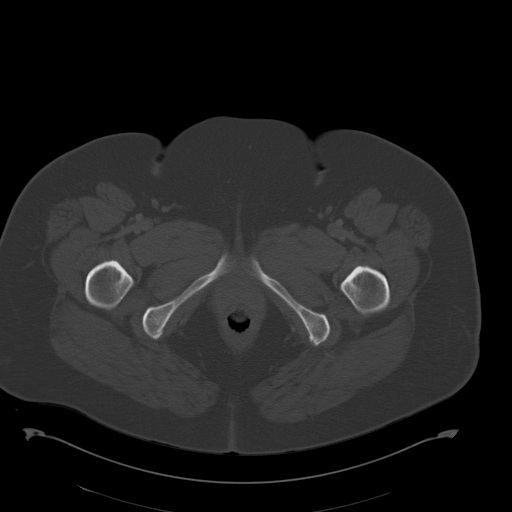
[im 10/91  soft-tissue]
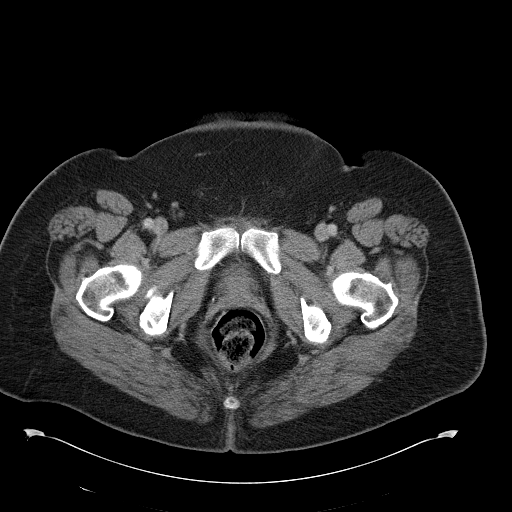
[im 19/91  soft-tissue]
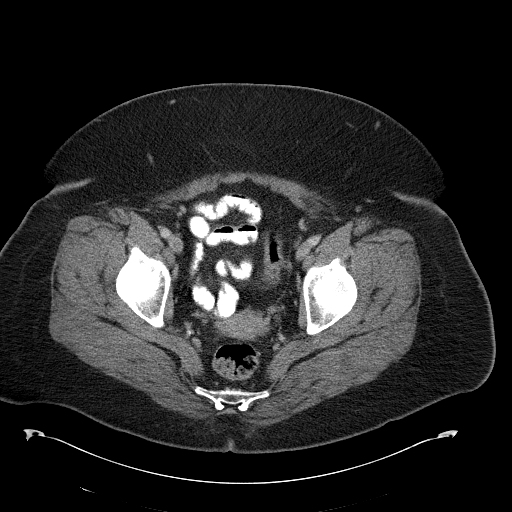
[im 24/91  soft-tissue]
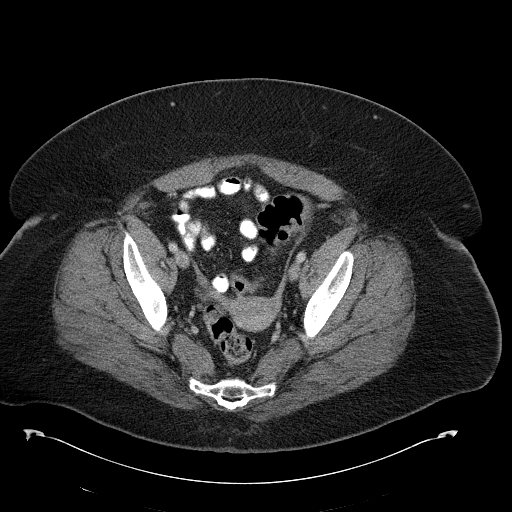
[im 29/91  soft-tissue]
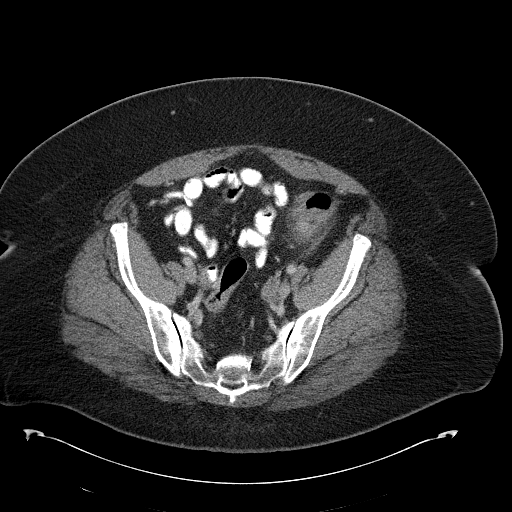
[im 38/91  soft-tissue]
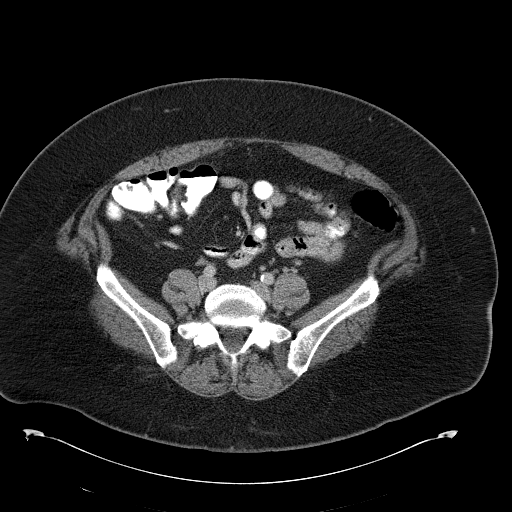
[im 43/91  soft-tissue]
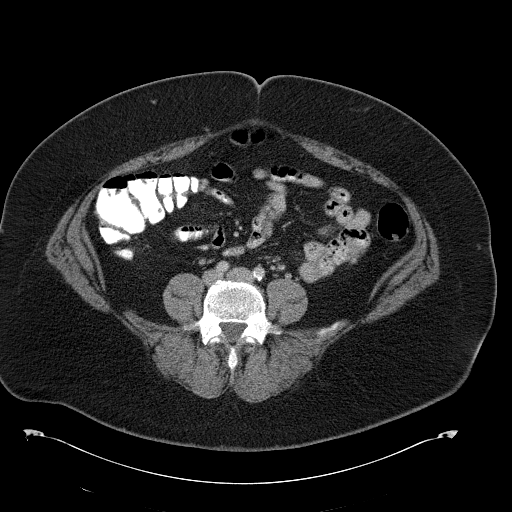
[im 48/91  soft-tissue]
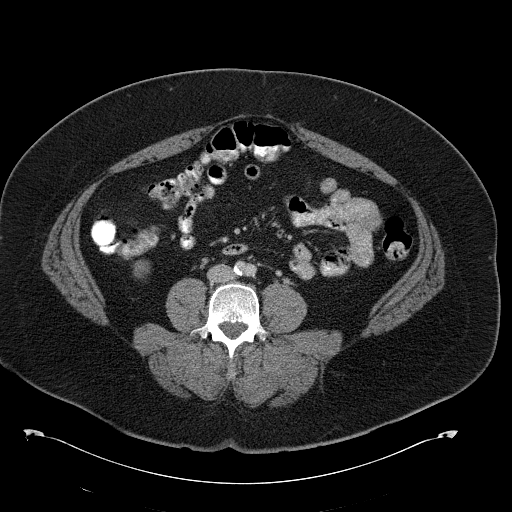
[im 53/91  soft-tissue]
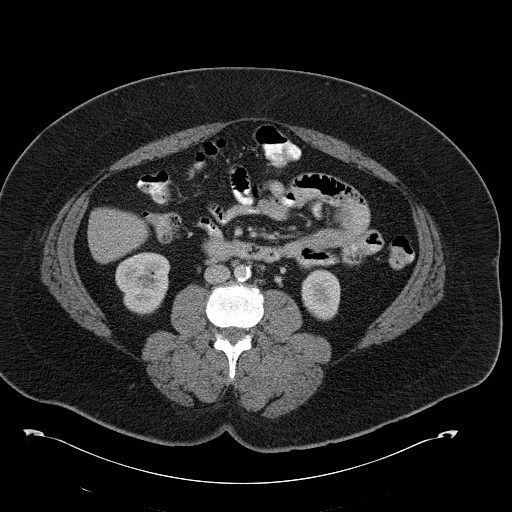
[im 53/91  bone]
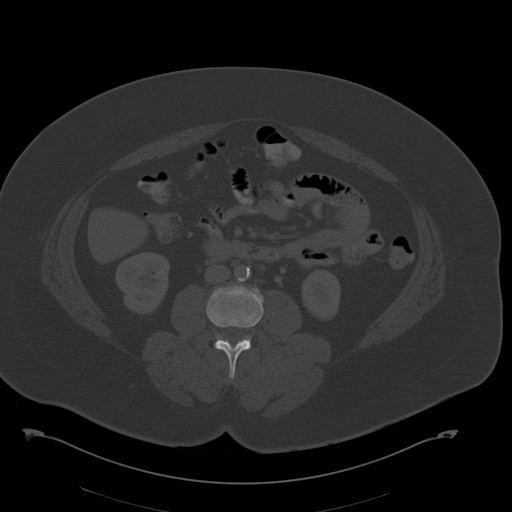
[im 62/91  soft-tissue]
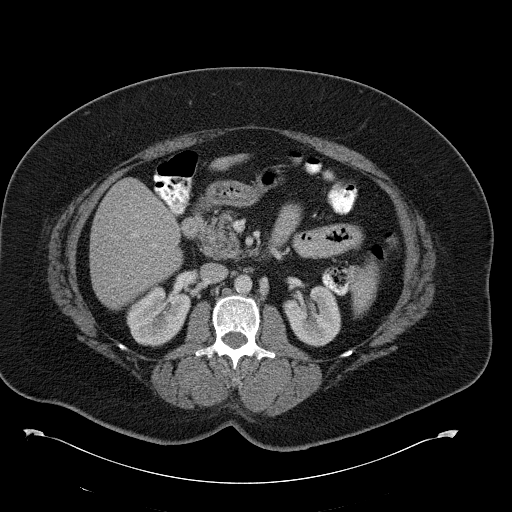
[im 67/91  soft-tissue]
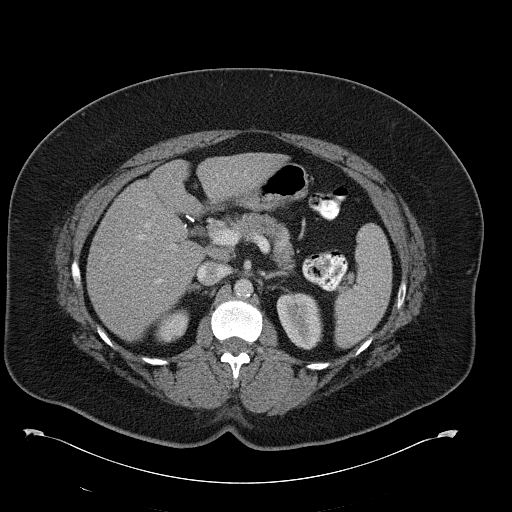
[im 72/91  soft-tissue]
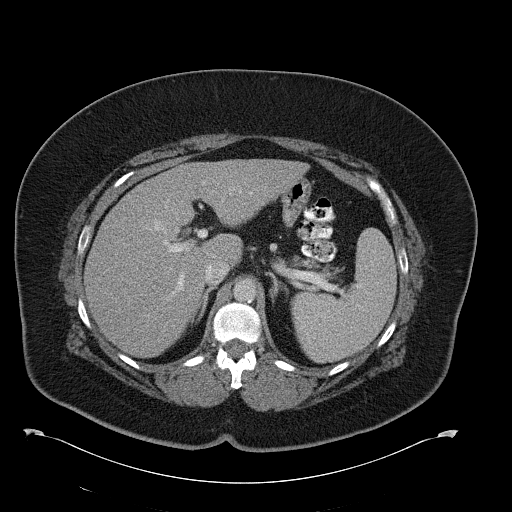
[im 81/91  soft-tissue]
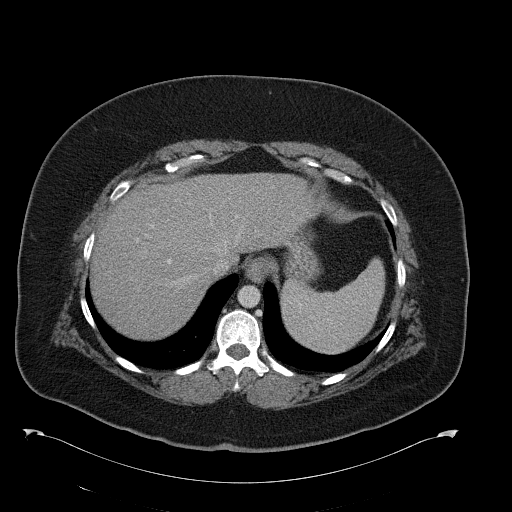
[im 86/91  soft-tissue]
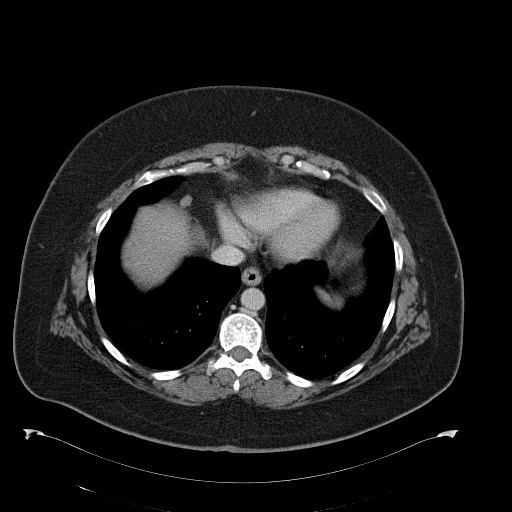

[Series 602: <mpr thick range> · coronal · 0.89mm/px · 3 of 109 slices shown]
[im 37/109  soft-tissue]
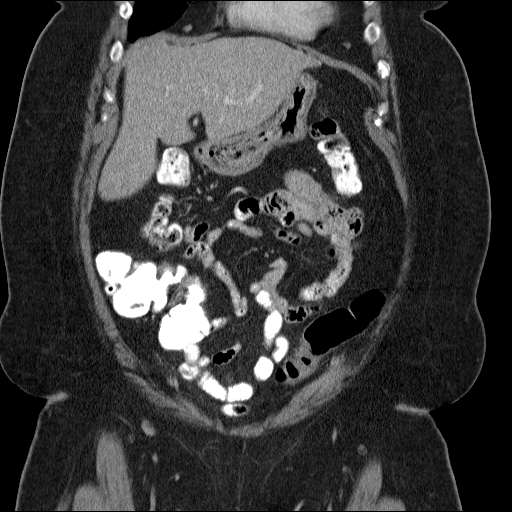
[im 49/109  soft-tissue]
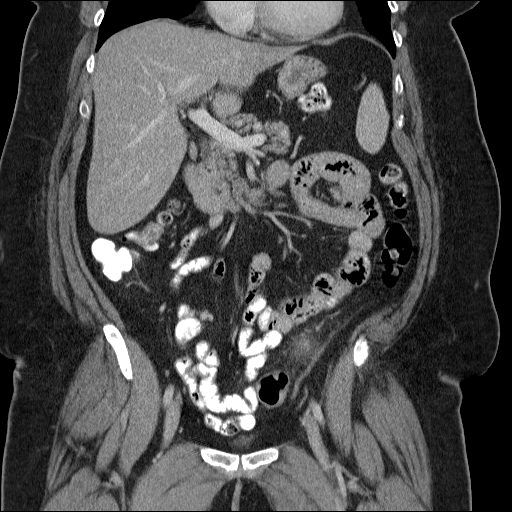
[im 61/109  soft-tissue]
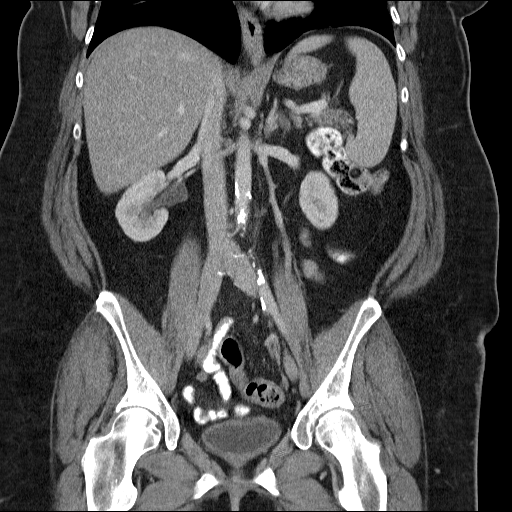

[17 of 46 positions shown; findings below may reference images not displayed]

FINDINGS: There are streaky artifacts from patient's large body habitus.
Sagittal images of the spine are unremarkable. Atherosclerotic
calcifications of abdominal aorta and iliac arteries. No aortic
aneurysm. The patient is status postcholecystectomy. Mild hepatic
fatty infiltration. Pancreas, spleen and adrenal glands are
unremarkable.

Kidneys are symmetrical in size and enhancement. No hydronephrosis
or hydroureter.

No small bowel obstruction. There is no pericecal inflammation. No
ascites or free air. No adenopathy. Normal appendix.

Axial image 62 there is abnormal thickening of proximal sigmoid
colon in left lower quadrant. A inflamed colonic diverticulum is
noted at this level with thickening of diverticular wall and
stranding of surrounding fat. Findings are consistent with acute
diverticulitis. Few diverticula are noted proximal sigmoid colon.
The uterus and adnexa are unremarkable. No distal colonic
obstruction.

Delayed renal images shows bilateral renal symmetrical excretion.
Bilateral visualized proximal ureter is unremarkable.
IMPRESSION: 1. There is abnormal focal thickening of proximal sigmoid colon wall
in left lower quadrant. There is a colonic inflamed diverticulum in
axial image 62 with stranding of surrounding fat. Findings are
consistent with acute diverticulitis. No extraluminal contrast
material is noted.
2. Normal appendix.  No pericecal inflammation.
3. Fatty infiltration of the liver.  Status postcholecystectomy.
4. No hydronephrosis or hydroureter.

## 2015-07-03 ENCOUNTER — Encounter: Payer: Self-pay | Admitting: Emergency Medicine

## 2015-07-03 ENCOUNTER — Ambulatory Visit (INDEPENDENT_AMBULATORY_CARE_PROVIDER_SITE_OTHER): Payer: Commercial Managed Care - HMO | Admitting: Emergency Medicine

## 2015-07-03 ENCOUNTER — Other Ambulatory Visit: Payer: Self-pay | Admitting: Emergency Medicine

## 2015-07-03 VITALS — BP 126/70 | HR 64 | Temp 97.2°F | Resp 16 | Ht 65.0 in | Wt 275.6 lb

## 2015-07-03 DIAGNOSIS — E669 Obesity, unspecified: Secondary | ICD-10-CM

## 2015-07-03 DIAGNOSIS — E781 Pure hyperglyceridemia: Secondary | ICD-10-CM

## 2015-07-03 DIAGNOSIS — E118 Type 2 diabetes mellitus with unspecified complications: Secondary | ICD-10-CM

## 2015-07-03 LAB — GLUCOSE, POCT (MANUAL RESULT ENTRY): POC Glucose: 174 mg/dl — AB (ref 70–99)

## 2015-07-03 LAB — POCT GLYCOSYLATED HEMOGLOBIN (HGB A1C): Hemoglobin A1C: 6.4

## 2015-07-03 NOTE — Progress Notes (Addendum)
Patient ID: [name], female   DOB: September 22, 1960, 55 y.o.   MRN: [id number]     By signing my name below, I, Zola Button, attest that this documentation has been prepared under the direction and in the presence of Arlyss Queen, MD.  Electronically Signed: Zola Button, Medical Scribe. 07/03/2015. 8:18 AM.   Chief Complaint:  Chief Complaint  Patient presents with  . Follow-up  . Diabetes  . Hgb A1c    HPI: Danielle Chase is a 55 y.o. female with a history of HTN, HLD, and DM who reports to [facility] today for a follow-up. Patient states her blood sugar has been doing fine. She has not been exercising and has not been doing well with her weight. She feels she does not have the energy to exercise as she should. She is hesitant to have gastric bypass surgery. Patient sees her eye doctor every year. She declines pneumonia vaccination.  Wt Readings from Last 3 Encounters:  07/03/15 275 lb 9.6 oz (125.011 kg)  02/27/15 274 lb (124.286 kg)  08/13/14 271 lb (122.925 kg)     Past Medical History  Diagnosis Date  . Diabetes mellitus without complication ([place])   . Hypertension   . Hyperlipidemia   . Allergy    Past Surgical History  Procedure Laterality Date  . Cholecystectomy    . Tubal ligation    . Cesarean section      x1   Social History   Social History  . Marital Status: Married    Spouse Name: N/A  . Number of Children: N/A  . Years of Education: N/A   Social History Main Topics  . Smoking status: Former Smoker    Types: Cigarettes    Quit date: 05/01/2001  . Smokeless tobacco: Never Used     Comment: quit 12 yrs ago  . Alcohol Use: 0.0 oz/week    0 Standard drinks or equivalent per week     Comment: socially  . Drug Use: No  . Sexual Activity:    Partners: Male   Other Topics Concern  . None   Social History Narrative   Family History  Problem Relation Age of Onset  . COPD Mother   . Heart disease Father   . Cancer Sister   . Colon cancer Neg Hx   .  Rectal cancer Neg Hx   . Stomach cancer Neg Hx   . Diabetes Brother    No Known Allergies Prior to Admission medications   Medication Sig Start Date End Date Taking? Authorizing Provider  aspirin 81 MG tablet Take 81 mg by mouth daily.    Historical Provider, MD  DULoxetine (CYMBALTA) 20 MG capsule TAKE ONE CAPSULE BY MOUTH EVERY DAY 09/13/14   Darlyne Russian, MD  fluticasone [facility]) 50 MCG/ACT nasal spray INSTILL 2 SPRAYS IN EACH NOSTRIL DAILY 01/17/15   Darlyne Russian, MD  hydrochlorothiazide (HYDRODIURIL) 25 MG tablet TAKE 1 TABLET BY MOUTH EVERY DAY 05/11/15   Darlyne Russian, MD  losartan (COZAAR) 50 MG tablet TAKE 1 TABLET BY MOUTH DAILY 09/13/14   Darlyne Russian, MD  metFORMIN (GLUCOPHAGE-XR) 500 MG 24 hr tablet TAKE 1 TABLET BY MOUTH DAILY WITH BREAKFAST 03/18/15   Darlyne Russian, MD  Multiple Vitamins tablet TAKE 1 TABLET BY MOUTH DAILY 08/24/12   Darlyne Russian, MD  PREMARIN vaginal cream  04/04/14   Historical Provider, MD  propranolol ER (INDERAL LA) 60 MG 24 hr capsule TAKE 1 CAPSULE BY  MOUTH EVERY DAY 03/19/15   Darlyne Russian, MD  simvastatin (ZOCOR) 40 MG tablet TAKE 1/2 TABLET BY MOUTH EVERY NIGHT AT BEDTIME 03/19/15   Darlyne Russian, MD     ROS: The patient denies fevers, chills, night sweats, unintentional weight loss, chest pain, palpitations, wheezing, dyspnea on exertion, nausea, vomiting, abdominal pain, dysuria, hematuria, melena, numbness, weakness, or tingling.   All other systems have been reviewed and were otherwise negative with the exception of those mentioned in the HPI and as above.    PHYSICAL EXAM: Filed Vitals:   07/03/15 0814  BP: 126/70  Pulse: 64  Temp: 97.2 F (36.2 C)  Resp: 16   Body mass index is 45.86 kg/(m^2).   General: Alert, no acute distress. Morbidly obese. HEENT:  Normocephalic, atraumatic, oropharynx patent. Eye: [name] [facility] Cardiovascular:  Regular rate and rhythm, no rubs murmurs or gallops.  No Carotid bruits, radial pulse intact. No pedal  edema.  Respiratory: Clear to auscultation bilaterally.  No wheezes, rales, or rhonchi.  No cyanosis, no use of accessory musculature Abdominal: No organomegaly, abdomen is soft and non-tender, positive bowel sounds.  No masses. Musculoskeletal: Gait intact. No edema, tenderness Skin: No rashes. Neurologic: Facial musculature symmetric. Psychiatric: Patient acts appropriately throughout our interaction. Lymphatic: No cervical or submandibular lymphadenopathy    LABS: Results for orders placed or performed in visit on 07/03/15  POCT glucose (manual entry)  Result Value Ref Range   POC Glucose 174 (A) 70 - 99 mg/dl  POCT glycosylated hemoglobin (Hb A1C)  Result Value Ref Range   Hemoglobin A1C 6.4      EKG/XRAY:   Primary read interpreted by Dr. [name] at [facility].   ASSESSMENT/PLAN: Patient's main problem is her morbid obesity. I asked her if she would be interested in bypass surgery. At the present time she is not. She needs a significant lifestyle change. I told her I could not write for her to have weight loss drugs. I asked her about possibly having a sleep study and she will think about this. She declined a pneumonia vaccine.Hemoglobin now at 6.4. Her last A1c was. was also 6.4. I do think she would benefit for consideration of bypass surgery.   Gross sideeffects, risk and benefits, and alternatives of medications d/w patient. Patient is aware that all medications have potential sideeffects and we are unable to predict every sideeffect or drug-drug interaction that may occur.  Arlyss Queen MD 07/03/2015 8:18 AM

## 2015-07-03 NOTE — Patient Instructions (Addendum)
IF you received an x-ray today, you will receive an invoice from [facility]. Please contact [facility] at [phone] with questions or concerns regarding your invoice.   IF you received labwork today, you will receive an invoice from [employer]. Please contact [facility] at [phone] with questions or concerns regarding your invoice.   Our billing staff will not be able to assist you with questions regarding bills from these companies.  You will be contacted with the lab results as soon as they are available. The fastest way to get your results is to activate your My Chart account. Instructions are located on the last page of this paperwork. If you have not heard from [place] regarding the results in 2 weeks, please contact this office.    Health mainHealth Maintenance, Female Adopting a healthy lifestyle and getting preventive care can go a long way to promote health and wellness. Talk with your health care provider about what schedule of regular examinations is right for you. This is a good chance for you to check in with your provider about disease prevention and staying healthy. In between checkups, there are plenty of things you can do on your own. Experts have done a lot of research about which lifestyle changes and preventive measures are most likely to keep you healthy. Ask your health care provider for more information. WEIGHT AND DIET  Eat a healthy diet  Be sure to include plenty of vegetables, fruits, low-fat dairy products, and lean protein.  Do not eat a lot of foods high in solid fats, added sugars, or salt.  Get regular exercise. This is one of the most important things you can do for your health.  Most adults should exercise for at least 150 minutes each week. The exercise should increase your heart rate and make you sweat (moderate-intensity exercise).  Most adults should also do strengthening exercises at least twice a  week. This is in addition to the moderate-intensity exercise.  Maintain a healthy weight  Body mass index (BMI) is a measurement that can be used to identify possible weight problems. It estimates body fat based on height and weight. Your health care provider can help determine your BMI and help you achieve or maintain a healthy weight.  For females 72 years of age and older:   A BMI below 18.5 is considered underweight.  A BMI of 18.5 to 24.9 is normal.  A BMI of 25 to 29.9 is considered overweight.  A BMI of 30 and above is considered obese.  Watch levels of cholesterol and blood lipids  You should start having your blood tested for lipids and cholesterol at 56 years of age, then have this test every 5 years.  You may need to have your cholesterol levels checked more often if:  Your lipid or cholesterol levels are high.  You are older than 56 years of age.  You are at high risk for heart disease.  CANCER SCREENING   Lung Cancer  Lung cancer screening is recommended for adults 40-42 years old who are at high risk for lung cancer because of a history of smoking.  A yearly low-dose CT scan of the lungs is recommended for people who:  Currently smoke.  Have quit within the past 15 years.  Have at least a 30-pack-year history of smoking. A pack year is smoking an average of one pack of cigarettes a day for 1 year.  Yearly screening should continue until it has been  been 15 years since you quit.  Yearly screening should stop if you develop a health problem that would prevent you from having lung cancer treatment.  Breast Cancer  Practice breast self-awareness. This means understanding how your breasts normally appear and feel.  It also means doing regular breast self-exams. Let your health care provider know about any changes, no matter how small.  If you are in your 20s or 30s, you should have a clinical breast exam (CBE) by a health care provider every 1-3 years as part  of a regular health exam.  If you are 40 or older, have a CBE every year. Also consider having a breast X-ray (mammogram) every year.  If you have a family history of breast cancer, talk to your health care provider about genetic screening.  If you are at high risk for breast cancer, talk to your health care provider about having an MRI and a mammogram every year.  Breast cancer gene (BRCA) assessment is recommended for women who have family members with BRCA-related cancers. BRCA-related cancers include:  Breast.  Ovarian.  Tubal.  Peritoneal cancers.  Results of the assessment will determine the need for genetic counseling and BRCA1 and BRCA2 testing. Cervical Cancer Your health care provider may recommend that you be screened regularly for cancer of the pelvic organs (ovaries, uterus, and vagina). This screening involves a pelvic examination, including checking for microscopic changes to the surface of your cervix (Pap test). You may be encouraged to have this screening done every 3 years, beginning at age 21.  For women ages 30-65, health care providers may recommend pelvic exams and Pap testing every 3 years, or they may recommend the Pap and pelvic exam, combined with testing for human papilloma virus (HPV), every 5 years. Some types of HPV increase your risk of cervical cancer. Testing for HPV may also be done on women of any age with unclear Pap test results.  Other health care providers may not recommend any screening for nonpregnant women who are considered low risk for pelvic cancer and who do not have symptoms. Ask your health care provider if a screening pelvic exam is right for you.  If you have had past treatment for cervical cancer or a condition that could lead to cancer, you need Pap tests and screening for cancer for at least 20 years after your treatment. If Pap tests have been discontinued, your risk factors (such as having a new sexual partner) need to be reassessed to  determine if screening should resume. Some women have medical problems that increase the chance of getting cervical cancer. In these cases, your health care provider may recommend more frequent screening and Pap tests. Colorectal Cancer  This type of cancer can be detected and often prevented.  Routine colorectal cancer screening usually begins at 56 years of age and continues through 56 years of age.  Your health care provider may recommend screening at an earlier age if you have risk factors for colon cancer.  Your health care provider may also recommend using home test kits to check for hidden blood in the stool.  A small camera at the end of a tube can be used to examine your colon directly (sigmoidoscopy or colonoscopy). This is done to check for the earliest forms of colorectal cancer.  Routine screening usually begins at age 50.  Direct examination of the colon should be repeated every 5-10 years through 56 years of age. However, you may need to be screened more often   early forms of precancerous polyps or small growths are found. Skin Cancer  Check your skin from head to toe regularly.  Tell your health care provider about any new moles or changes in moles, especially if there is a change in a mole's shape or color.  Also tell your health care provider if you have a mole that is larger than the size of a pencil eraser.  Always use sunscreen. Apply sunscreen liberally and repeatedly throughout the day.  Protect yourself by wearing long sleeves, pants, a wide-brimmed hat, and sunglasses whenever you are outside. HEART DISEASE, DIABETES, AND HIGH BLOOD PRESSURE   High blood pressure causes heart disease and increases the risk of stroke. High blood pressure is more likely to develop in:  People who have blood pressure in the high end of the normal range (130-139/85-89 mm Hg).  People who are overweight or obese.  People who are African American.  If you are 18-39 years of  age, have your blood pressure checked every 3-5 years. If you are 40 years of age or older, have your blood pressure checked every year. You should have your blood pressure measured twice--once when you are at a hospital or clinic, and once when you are not at a hospital or clinic. Record the average of the two measurements. To check your blood pressure when you are not at a hospital or clinic, you can use:  An automated blood pressure machine at a pharmacy.  A home blood pressure monitor.  If you are between 55 years and 79 years old, ask your health care provider if you should take aspirin to prevent strokes.  Have regular diabetes screenings. This involves taking a blood sample to check your fasting blood sugar level.  If you are at a normal weight and have a low risk for diabetes, have this test once every three years after 56 years of age.  If you are overweight and have a high risk for diabetes, consider being tested at a younger age or more often. PREVENTING INFECTION  Hepatitis B  If you have a higher risk for hepatitis B, you should be screened for this virus. You are considered at high risk for hepatitis B if:  You were born in a country where hepatitis B is common. Ask your health care provider which countries are considered high risk.  Your parents were born in a high-risk country, and you have not been immunized against hepatitis B (hepatitis B vaccine).  You have HIV or AIDS.  You use needles to inject street drugs.  You live with someone who has hepatitis B.  You have had sex with someone who has hepatitis B.  You get hemodialysis treatment.  You take certain medicines for conditions, including cancer, organ transplantation, and autoimmune conditions. Hepatitis C  Blood testing is recommended for:  Everyone born from 1945 through 1965.  Anyone with known risk factors for hepatitis C. Sexually transmitted infections (STIs)  You should be screened for sexually  transmitted infections (STIs) including gonorrhea and chlamydia if:  You are sexually active and are younger than 56 years of age.  You are older than 56 years of age and your health care provider tells you that you are at risk for this type of infection.  Your sexual activity has changed since you were last screened and you are at an increased risk for chlamydia or gonorrhea. Ask your health care provider if you are at risk.  If you do not have HIV, but   are at risk, it may be recommended that you take a prescription medicine daily to prevent HIV infection. This is called pre-exposure prophylaxis (PrEP). You are considered at risk if:  You are sexually active and do not regularly use condoms or know the HIV status of your partner(s).  You take drugs by injection.  You are sexually active with a partner who has HIV. Talk with your health care provider about whether you are at high risk of being infected with HIV. If you choose to begin PrEP, you should first be tested for HIV. You should then be tested every 3 months for as long as you are taking PrEP.  PREGNANCY   If you are premenopausal and you may become pregnant, ask your health care provider about preconception counseling.  If you may become pregnant, take 400 to 800 micrograms (mcg) of folic acid every day.  If you want to prevent pregnancy, talk to your health care provider about birth control (contraception). OSTEOPOROSIS AND MENOPAUSE   Osteoporosis is a disease in which the bones lose minerals and strength with aging. This can result in serious bone fractures. Your risk for osteoporosis can be identified using a bone density scan.  If you are 68 years of age or older, or if you are at risk for osteoporosis and fractures, ask your health care provider if you should be screened.  Ask your health care provider whether you should take a calcium or vitamin D supplement to lower your risk for osteoporosis.  Menopause may have  certain physical symptoms and risks.  Hormone replacement therapy may reduce some of these symptoms and risks. Talk to your health care provider about whether hormone replacement therapy is right for you.  HOME CARE INSTRUCTIONS   Schedule regular health, dental, and eye exams.  Stay current with your immunizations.   Do not use any tobacco products including cigarettes, chewing tobacco, or electronic cigarettes.  If you are pregnant, do not drink alcohol.  If you are breastfeeding, limit how much and how often you drink alcohol.  Limit alcohol intake to no more than 1 drink per day for nonpregnant women. One drink equals 12 ounces of beer, 5 ounces of wine, or 1 ounces of hard liquor.  Do not use street drugs.  Do not share needles.  Ask your health care provider for help if you need support or information about quitting drugs.  Tell your health care provider if you often feel depressed.  Tell your health care provider if you have ever been abused or do not feel safe at home.   This information is not intended to replace advice given to you by your health care provider. Make sure you discuss any questions you have with your health care provider.   Document Released: 10/12/2010 Document Revised: 04/19/2014 Document Reviewed: 02/28/2013 Elsevier Interactive Patient Education [employer].

## 2015-07-20 ENCOUNTER — Other Ambulatory Visit: Payer: Self-pay | Admitting: Emergency Medicine

## 2015-08-14 ENCOUNTER — Other Ambulatory Visit: Payer: Self-pay | Admitting: Emergency Medicine

## 2015-09-11 ENCOUNTER — Other Ambulatory Visit: Payer: Self-pay | Admitting: Emergency Medicine

## 2015-09-15 ENCOUNTER — Telehealth: Payer: Self-pay

## 2015-09-15 ENCOUNTER — Other Ambulatory Visit: Payer: Self-pay | Admitting: Emergency Medicine

## 2015-09-15 MED ORDER — PROPRANOLOL HCL ER 60 MG PO CP24: ORAL_CAPSULE | ORAL | Status: DC

## 2015-09-15 NOTE — Telephone Encounter (Signed)
Done

## 2015-09-15 NOTE — Telephone Encounter (Signed)
Pt states that we approved and sent over to pharmacy the propranolol er prescription,but the pharmacy does not have and she would like for it to be resent   Best number is

## 2015-09-26 ENCOUNTER — Ambulatory Visit (INDEPENDENT_AMBULATORY_CARE_PROVIDER_SITE_OTHER): Payer: Commercial Managed Care - HMO | Admitting: Internal Medicine

## 2015-09-26 ENCOUNTER — Encounter: Payer: Self-pay | Admitting: Internal Medicine

## 2015-09-26 VITALS — BP 124/86 | HR 68 | Temp 98.1°F | Resp 16 | Wt 278.0 lb

## 2015-09-26 DIAGNOSIS — E785 Hyperlipidemia, unspecified: Secondary | ICD-10-CM

## 2015-09-26 DIAGNOSIS — E118 Type 2 diabetes mellitus with unspecified complications: Secondary | ICD-10-CM

## 2015-09-26 DIAGNOSIS — I10 Essential (primary) hypertension: Secondary | ICD-10-CM

## 2015-09-26 MED ORDER — METFORMIN HCL ER 500 MG PO TB24: 500.0000 mg | ORAL_TABLET | Freq: Every day | ORAL | Status: DC

## 2015-09-26 MED ORDER — LOSARTAN POTASSIUM 50 MG PO TABS: 50.0000 mg | ORAL_TABLET | Freq: Every day | ORAL | Status: DC

## 2015-09-26 MED ORDER — SIMVASTATIN 40 MG PO TABS: ORAL_TABLET | ORAL | Status: DC

## 2015-09-26 MED ORDER — HYDROCHLOROTHIAZIDE 25 MG PO TABS: 25.0000 mg | ORAL_TABLET | Freq: Every day | ORAL | Status: DC

## 2015-09-26 MED ORDER — PHENTERMINE HCL 37.5 MG PO CAPS: 37.5000 mg | ORAL_CAPSULE | ORAL | Status: DC

## 2015-09-26 MED ORDER — PROPRANOLOL HCL ER 60 MG PO CP24: ORAL_CAPSULE | ORAL | Status: DC

## 2015-09-26 NOTE — Assessment & Plan Note (Signed)
BP well controlled Current regimen effective and well tolerated Continue current medications at current doses Will check labs at next visit

## 2015-09-26 NOTE — Progress Notes (Signed)
Pre visit review using our clinic review tool, if applicable. No additional management support is needed unless otherwise documented below in the visit note. 

## 2015-09-26 NOTE — Assessment & Plan Note (Signed)
Stressed regular exercise - increase Decrease portions Will start phentermine 30 mg daily She will monitor her BP at home F/u in 3 months

## 2015-09-26 NOTE — Progress Notes (Signed)
Subjective:    Patient ID: Danielle Chase, female    DOB: [birth year], 55 y.o.   MRN: [id number]  HPI She is here to establish with a new pcp.    Obese:  She wants to lose weight and wants to get off some of her medications.   She did go to the bariatric center in the past and was put on phentermine - it helped her lose 30 lbs and she tolerated it well.    Diabetes: She is taking her medication daily as prescribed. Her a1c has been around 6.  She is compliant with a low sugar diet, but has difficulty with the carbohydrates.  She is exercising regularly - goes to gym - does treadmill and weights. She does monitor her sugars at home.  She is up-to-date with an ophthalmology examination.   She was going through menopause and was having a lot of lightheadedness and did not fel well.  She was put on cymbalta and that seemed to help.  She is trying to get off of it.  She is now taking it twice a week.    Hypertension: She is taking her medication daily. She is compliant with a low sodium diet.  She denies chest pain, palpitations, edema, shortness of breath and regular headaches. She is exercising regularly.  She does not monitor her blood pressure at home.    Hyperlipidemia: She is taking her medication daily. She is compliant with a low fat/cholesterol diet. She is exercising. She denies myalgias.  She snores at night.  She denies witnessed apnea.  She does have  Low energy level.    Medications and allergies reviewed with patient and updated if appropriate.  Patient Active Problem List   Diagnosis Date Noted  . 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 Carpal tunnel syndrome 06/05/2013  . Diabetes mellitus ([place]) 08/03/2011  . Hypertension 08/03/2011  . Hyperlipidemia 08/03/2011    Current Outpatient Prescriptions on File Prior to Visit  Medication Sig Dispense Refill  . aspirin 81 MG tablet Take 81 mg by mouth daily.    . DULoxetine (CYMBALTA) 20 MG capsule TAKE ONE CAPSULE  BY MOUTH EVERY DAY 90 capsule 1  . fluticasone (FLONASE) 50 MCG/ACT nasal spray INSTILL 2 SPRAYS IN EACH NOSTRIL DAILY (Patient taking differently: INSTILL 2 SPRAYS IN EACH NOSTRIL DAILY PRN) 48 g 1  . hydrochlorothiazide (HYDRODIURIL) 25 MG tablet TAKE 1 TABLET BY MOUTH EVERY DAY 90 tablet 0  . losartan (COZAAR) 50 MG tablet TAKE 1 TABLET BY MOUTH DAILY 90 tablet 1  . metFORMIN (GLUCOPHAGE-XR) 500 MG 24 hr tablet TAKE 1 TABLET BY MOUTH DAILY WITH BREAKFAST 90 tablet 2  . Multiple Vitamins tablet TAKE 1 TABLET BY MOUTH DAILY 30 tablet 5  . PREMARIN vaginal cream   11  . propranolol ER (INDERAL LA) 60 MG 24 hr capsule TAKE 1 CAPSULE BY MOUTH EVERY DAY 90 capsule 0  . simvastatin (ZOCOR) 40 MG tablet TAKE 1/2 TABLET BY MOUTH EVERY NIGHT AT BEDTIME 45 tablet 1   No current facility-administered medications on file prior to visit.    Past Medical History  Diagnosis Date  . Diabetes mellitus without complication ([place])   . Hypertension   . Hyperlipidemia   . Allergy     Past Surgical History  Procedure Laterality Date  . Cholecystectomy    . Tubal ligation    . Cesarean section      x1    Social History  Social History  . Marital Status: Married    Spouse Name: N/A  . Number of Children: N/A  . Years of Education: N/A   Social History Main Topics  . Smoking status: Former Smoker    Types: Cigarettes    Quit date: 05/01/2001  . Smokeless tobacco: Never Used     Comment: quit 12 yrs ago  . Alcohol Use: 0.0 oz/week    0 Standard drinks or equivalent per week     Comment: socially  . Drug Use: No  . Sexual Activity:    Partners: Male   Other Topics Concern  . Not on file   Social History Narrative    Family History  Problem Relation Age of Onset  . COPD Mother   . Heart disease Father   . Cancer Sister   . Colon cancer Neg Hx   . Rectal cancer Neg Hx   . Stomach cancer Neg Hx   . Diabetes Brother     Review of Systems  Constitutional: Positive for fatigue.  Negative for fever and chills.  Eyes: Negative for visual disturbance.  Respiratory: Negative for cough, shortness of breath and wheezing.   Cardiovascular: Negative for chest pain, palpitations and leg swelling.  Gastrointestinal: Negative for abdominal pain, diarrhea, constipation and blood in stool.       Rare GERD  Endocrine: Negative for polyuria.  Genitourinary: Negative for dysuria and hematuria.  Musculoskeletal: Negative for back pain and arthralgias.  Neurological: Positive for light-headedness. Negative for dizziness and headaches.  Psychiatric/Behavioral: Negative for dysphoric mood. The patient is not nervous/anxious.        Objective:   Filed Vitals:   09/26/15 1300  BP: 124/86  Pulse: 68  Temp: 98.1 F (36.7 C)  Resp: 16   Filed Weights   09/26/15 1300  Weight: 278 lb (126.1 kg)   Body mass index is 46.26 kg/(m^2).   Physical Exam Constitutional: She appears well-developed and well-nourished. No distress.  HENT:  Head: Normocephalic and atraumatic.  Right Ear: External ear normal. Normal ear canal and TM Left Ear: External ear normal.  Normal ear canal and TM Mouth/Throat: Oropharynx is clear and moist.  Eyes: Conjunctivae and EOM are normal.  Neck: Neck supple. No tracheal deviation present. No thyromegaly present.  No carotid bruit  Cardiovascular: Normal rate, regular rhythm and normal heart sounds.   No murmur heard.  No edema. Pulmonary/Chest: Effort normal and breath sounds normal. No respiratory distress. She has no wheezes. She has no rales.  Abdominal: Soft. She exhibits no distension. There is no tenderness.  Lymphadenopathy: She has no cervical adenopathy.  Skin: Skin is warm and dry. She is not diaphoretic.  Psychiatric: She has a normal mood and affect. Her behavior is normal.       Assessment & Plan:   See Problem List for Assessment and Plan of chronic medical problems.  F/u in 3 months

## 2015-09-26 NOTE — Assessment & Plan Note (Signed)
Taking simvastatin daily Lipids controlled Will check lipids in 3 months Continue simvastatin

## 2015-09-26 NOTE — Patient Instructions (Addendum)
  All other Health Maintenance issues reviewed.   All recommended immunizations and age-appropriate screenings are up-to-date or discussed.  No immunizations administered today.   Medications reviewed and updated.  Changes include discontinuing the cymbalta.  We will also start phentermine again.  We will have you take this for 3 months.    Your prescription(s) have been submitted to your pharmacy. Please take as directed and contact our office if you believe you are having problem(s) with the medication(s).   Please followup in 3 months

## 2015-09-26 NOTE — Assessment & Plan Note (Signed)
Lab Results  Component Value Date   HGBA1C 6.4 07/03/2015   Well controlled Continue metformin at current dose Exercise Work on weight loss F/u in 3 months

## 2015-11-20 ENCOUNTER — Encounter: Payer: Self-pay | Admitting: Internal Medicine

## 2015-11-24 MED ORDER — DULOXETINE HCL 20 MG PO CPEP: 20.0000 mg | ORAL_CAPSULE | Freq: Every day | ORAL | 1 refills | Status: DC

## 2015-12-23 ENCOUNTER — Encounter: Payer: Self-pay | Admitting: Internal Medicine

## 2015-12-23 ENCOUNTER — Ambulatory Visit (INDEPENDENT_AMBULATORY_CARE_PROVIDER_SITE_OTHER): Payer: Commercial Managed Care - HMO | Admitting: Internal Medicine

## 2015-12-23 ENCOUNTER — Other Ambulatory Visit (INDEPENDENT_AMBULATORY_CARE_PROVIDER_SITE_OTHER): Payer: Commercial Managed Care - HMO

## 2015-12-23 VITALS — BP 112/72 | HR 64 | Temp 97.6°F | Resp 18 | Wt 267.0 lb

## 2015-12-23 DIAGNOSIS — E118 Type 2 diabetes mellitus with unspecified complications: Secondary | ICD-10-CM | POA: Diagnosis not present

## 2015-12-23 DIAGNOSIS — E785 Hyperlipidemia, unspecified: Secondary | ICD-10-CM

## 2015-12-23 DIAGNOSIS — F419 Anxiety disorder, unspecified: Secondary | ICD-10-CM

## 2015-12-23 DIAGNOSIS — F329 Major depressive disorder, single episode, unspecified: Secondary | ICD-10-CM

## 2015-12-23 DIAGNOSIS — I10 Essential (primary) hypertension: Secondary | ICD-10-CM | POA: Diagnosis not present

## 2015-12-23 DIAGNOSIS — Z23 Encounter for immunization: Secondary | ICD-10-CM

## 2015-12-23 DIAGNOSIS — F32A Depression, unspecified: Secondary | ICD-10-CM

## 2015-12-23 DIAGNOSIS — F418 Other specified anxiety disorders: Secondary | ICD-10-CM

## 2015-12-23 LAB — COMPREHENSIVE METABOLIC PANEL
ALT: 24 U/L (ref 0–35)
AST: 18 U/L (ref 0–37)
Albumin: 3.8 g/dL (ref 3.5–5.2)
Alkaline Phosphatase: 50 U/L (ref 39–117)
BUN: 18 mg/dL (ref 6–23)
CALCIUM: 9.1 mg/dL (ref 8.4–10.5)
CHLORIDE: 100 meq/L (ref 96–112)
CO2: 33 meq/L — AB (ref 19–32)
CREATININE: 1.05 mg/dL (ref 0.40–1.20)
GFR: 57.66 mL/min — ABNORMAL LOW (ref >60.00)
GLUCOSE: 163 mg/dL — AB (ref 70–99)
Potassium: 3.8 mEq/L (ref 3.5–5.1)
SODIUM: 138 meq/L (ref 135–145)
Total Bilirubin: 1 mg/dL (ref 0.2–1.2)
Total Protein: 7.2 g/dL (ref 6.0–8.3)

## 2015-12-23 LAB — HEMOGLOBIN A1C: HEMOGLOBIN A1C: 6.2 % (ref 4.6–6.5)

## 2015-12-23 LAB — CBC WITH DIFFERENTIAL/PLATELET
BASOS PCT: 0.5 % (ref 0.0–3.0)
Basophils Absolute: 0 10*3/uL (ref 0.0–0.1)
EOS PCT: 2.9 % (ref 0.0–5.0)
Eosinophils Absolute: 0.2 10*3/uL (ref 0.0–0.7)
HCT: 42.8 % (ref 36.0–46.0)
Hemoglobin: 15 g/dL (ref 12.0–15.0)
LYMPHS ABS: 1.7 10*3/uL (ref 0.7–4.0)
Lymphocytes Relative: 21.9 % (ref 12.0–46.0)
MCHC: 35.1 g/dL (ref 30.0–36.0)
MCV: 93.1 fl (ref 78.0–100.0)
MONO ABS: 1.1 10*3/uL — AB (ref 0.1–1.0)
Monocytes Relative: 13.9 % — ABNORMAL HIGH (ref 3.0–12.0)
NEUTROS ABS: 4.6 10*3/uL (ref 1.4–7.7)
NEUTROS PCT: 60.8 % (ref 43.0–77.0)
PLATELETS: 212 10*3/uL (ref 150.0–400.0)
RBC: 4.6 Mil/uL (ref 3.87–5.11)
RDW: 13.1 % (ref 11.5–15.5)
WBC: 7.6 10*3/uL (ref 4.0–10.5)

## 2015-12-23 LAB — LIPID PANEL
CHOL/HDL RATIO: 3
Cholesterol: 122 mg/dL (ref 0–200)
HDL: 37.1 mg/dL — ABNORMAL LOW (ref >39.00)
LDL Cholesterol: 61 mg/dL (ref 0–99)
NONHDL: 85.29
Triglycerides: 119 mg/dL (ref 0.0–149.0)
VLDL: 23.8 mg/dL (ref 0.0–40.0)

## 2015-12-23 LAB — MICROALBUMIN / CREATININE URINE RATIO
Creatinine,U: 83.9 mg/dL
Microalb Creat Ratio: 0.8 mg/g (ref 0.0–30.0)

## 2015-12-23 LAB — TSH: TSH: 2.19 u[IU]/mL (ref 0.35–4.50)

## 2015-12-23 MED ORDER — PHENTERMINE HCL 37.5 MG PO CAPS: 37.5000 mg | ORAL_CAPSULE | ORAL | 2 refills | Status: DC

## 2015-12-23 NOTE — Assessment & Plan Note (Signed)
Mild Controlled with low dose cymbalta Continue current dose of cymbalta

## 2015-12-23 NOTE — Assessment & Plan Note (Addendum)
Lost 11 lbs since June on phentermine Tolerating the medication well She has been walking, some other exercise Will continue phentermine for 3 more months only - can consider long term weight loss med after that if she wants Increase exercise

## 2015-12-23 NOTE — Assessment & Plan Note (Signed)
Controlled Check a1c Increase exercise Decrease carbs Continue metformin

## 2015-12-23 NOTE — Patient Instructions (Addendum)
  Test(s) ordered today. Your results will be released to MyChart (or called to you) after review, usually within 72hours after test completion. If any changes need to be made, you will be notified at that same time.  All other Health Maintenance issues reviewed.   All recommended immunizations and age-appropriate screenings are up-to-date or discussed.  Flu vaccine administered today.   Medications reviewed and updated.  No changes recommended at this time.    Please followup in 6 months   

## 2015-12-23 NOTE — Progress Notes (Signed)
Subjective:    Patient ID: Danielle Chase, female    DOB: [birth year], 55 y.o.   MRN: [id number]  HPI The patient is here for follow up.  Diabetes: She is taking her medication daily as prescribed. She is compliant with a low sugar diet, but struggles with carbs. She is exercising regularly - goes to the gym. She does not monitor her sugars. She checks her feet daily and denies foot lesions. She is up-to-date with an ophthalmology examination.   Hypertension: She is taking her medication daily. She is compliant with a low sodium diet.  She denies chest pain, palpitations, edema, shortness of breath and regular headaches. She is exercising.  She does not monitor her blood pressure at home.     Hyperlipidemia: She is taking her medication daily. She is compliant with a low fat/cholesterol diet. She is exercising regularly, but knows she needs to increase her exercise. She denies myalgias.   Obese:  She is working on weight loss.  We started her on phentermine three months ago, which she had been on in the past and it was effective. She denies side effects from the medication.  She has lost 11 lbs.  She is exercising, but knows she needs to do more.    She takes cymbalta for mood variability.  She feels the medication helps and wants to continue it for now at her current   Medications and allergies reviewed with patient and updated if appropriate.  Patient Active Problem List   Diagnosis Date Noted  . Morbidly obese ([place]) 09/26/2015  . 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 Carpal tunnel syndrome 06/05/2013  . Diabetes mellitus ([place]) 08/03/2011  . Hypertension 08/03/2011  . Hyperlipidemia 08/03/2011    Current Outpatient Prescriptions on File Prior to Visit  Medication Sig Dispense Refill  . aspirin 81 MG tablet Take 81 mg by mouth daily.    . DULoxetine (CYMBALTA) 20 MG capsule Take 1 capsule (20 mg total) by mouth daily. 90 capsule 1  . fluticasone (FLONASE) 50 MCG/ACT  nasal spray INSTILL 2 SPRAYS IN EACH NOSTRIL DAILY (Patient taking differently: INSTILL 2 SPRAYS IN EACH NOSTRIL DAILY PRN) 48 g 1  . hydrochlorothiazide (HYDRODIURIL) 25 MG tablet Take 1 tablet (25 mg total) by mouth daily. 90 tablet 3  . losartan (COZAAR) 50 MG tablet Take 1 tablet (50 mg total) by mouth daily. 90 tablet 3  . metFORMIN (GLUCOPHAGE-XR) 500 MG 24 hr tablet Take 1 tablet (500 mg total) by mouth daily with breakfast. 90 tablet 2  . Multiple Vitamins tablet TAKE 1 TABLET BY MOUTH DAILY 30 tablet 5  . phentermine 37.5 MG capsule Take 1 capsule (37.5 mg total) by mouth every morning. 30 capsule 2  . PREMARIN vaginal cream   11  . propranolol ER (INDERAL LA) 60 MG 24 hr capsule TAKE 1 CAPSULE BY MOUTH EVERY DAY 90 capsule 3  . simvastatin (ZOCOR) 40 MG tablet TAKE 1/2 TABLET BY MOUTH EVERY NIGHT AT BEDTIME 45 tablet 3   No current facility-administered medications on file prior to visit.     Past Medical History:  Diagnosis Date  . Allergy   . Diabetes mellitus without complication ([place])   . Hyperlipidemia   . Hypertension     Past Surgical History:  Procedure Laterality Date  . CESAREAN SECTION     x1  . CHOLECYSTECTOMY    . TUBAL LIGATION      Social History   Social History  .  Marital status: Married    Spouse name: N/A  . Number of children: N/A  . Years of education: N/A   Social History Main Topics  . Smoking status: Former Smoker    Types: Cigarettes    Quit date: 05/01/2001  . Smokeless tobacco: Never Used     Comment: quit 12 yrs ago  . Alcohol use 0.0 oz/week     Comment: socially  . Drug use: No  . Sexual activity: Yes    Partners: Male   Other Topics Concern  . None   Social History Narrative  . None    Family History  Problem Relation Age of Onset  . COPD Mother   . Heart disease Father   . Breast cancer Sister   . Colon cancer Neg Hx   . Rectal cancer Neg Hx   . Stomach cancer Neg Hx   . Diabetes Brother     Review of Systems    Constitutional: Negative for fever.  Respiratory: Negative for cough, shortness of breath and wheezing.   Cardiovascular: Negative for chest pain, palpitations and leg swelling.  Neurological: Positive for numbness (sometimes hands at night - CTS). Negative for dizziness, light-headedness and headaches.       Objective:   Vitals:   12/23/15 0835  BP: 112/72  Pulse: 64  Resp: 18  Temp: 97.6 F (36.4 C)   Filed Weights   12/23/15 0835  Weight: 267 lb (121.1 kg)   Body mass index is 44.43 kg/m.   Physical Exam    Constitutional: Appears well-developed and well-nourished. No distress.  HENT:  Head: Normocephalic and atraumatic.  Neck: Neck supple. No tracheal deviation present. No thyromegaly present.  Cardiovascular: Normal rate, regular rhythm and normal heart sounds.   No murmur heard. No carotid bruit  Pulmonary/Chest: Effort normal and breath sounds normal. No respiratory distress. No has no wheezes. No rales.  Musculoskeletal: No edema.  Lymphadenopathy: No cervical adenopathy.  Skin: Skin is warm and dry. Not diaphoretic.  Psychiatric: Normal mood and affect. Behavior is normal.     Assessment & Plan:    See Problem List for Assessment and Plan of chronic medical problems.   F/u in 6 months

## 2015-12-23 NOTE — Assessment & Plan Note (Signed)
Check lipid, cmp Continue statin

## 2015-12-23 NOTE — Assessment & Plan Note (Addendum)
BP well controlled Current regimen effective and well tolerated Continue current medications at current doses Cmp, tsh 

## 2015-12-23 NOTE — Progress Notes (Signed)
Pre visit review using our clinic review tool, if applicable. No additional management support is needed unless otherwise documented below in the visit note. 

## 2015-12-28 ENCOUNTER — Encounter: Payer: Self-pay | Admitting: Internal Medicine

## 2016-06-20 NOTE — Progress Notes (Signed)
Subjective:    Patient ID: Danielle Chase, female    DOB: [birth year], 56 y.o.   MRN: [id number]  HPI She is here for follow up.  She has a home gym and has been trying to exercise regularly.    Hypertension: She is taking her medication daily. She is compliant with a low sodium diet.  She denies chest pain, edema, shortness of breath and regular headaches. She is exercising regularly.     Diabetes: She is taking her medication daily as prescribed. She is compliant with a diabetic diet. She is exercising regularly. She checks her feet daily and denies foot lesions. She is up-to-date with an ophthalmology examination.   Hyperlipidemia: She is taking her medication daily. She is compliant with a low fat/cholesterol diet. She is exercising regularly. She denies myalgias.   Anxiety, Depression: She is taking her medication twice a week - not daily. She denies any side effects from the medication. She feels her depression and anxiety are well controlled and she is happy with her current dose of medication. She understands taking the medication daily may be more effective, but she does not feel she needs it daily.   Morbid obesity:  She is working on weight loss, but has not seen much change on the scale. She does feel her clothes are looser.   Ears feels plugged:  This is chronic.  She has taken a decongestant the other day and it did help.  She uses flonase as needed.   Medications and allergies reviewed with patient and updated if appropriate.  Patient Active Problem List   Diagnosis Date Noted  . Anxiety and depression 12/23/2015  . Morbidly obese ([place]) 09/26/2015  . 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 Carpal tunnel syndrome 06/05/2013  . Diabetes mellitus ([place]) 08/03/2011  . Hypertension 08/03/2011  . Hyperlipidemia 08/03/2011    Current Outpatient Prescriptions on File Prior to Visit  Medication Sig Dispense Refill  . aspirin 81 MG tablet Take 81 mg by mouth  daily.    . DULoxetine (CYMBALTA) 20 MG capsule Take 1 capsule (20 mg total) by mouth daily. 90 capsule 1  . fluticasone (FLONASE) 50 MCG/ACT nasal spray INSTILL 2 SPRAYS IN EACH NOSTRIL DAILY (Patient taking differently: INSTILL 2 SPRAYS IN EACH NOSTRIL DAILY PRN) 48 g 1  . hydrochlorothiazide (HYDRODIURIL) 25 MG tablet Take 1 tablet (25 mg total) by mouth daily. 90 tablet 3  . losartan (COZAAR) 50 MG tablet Take 1 tablet (50 mg total) by mouth daily. 90 tablet 3  . metFORMIN (GLUCOPHAGE-XR) 500 MG 24 hr tablet Take 1 tablet (500 mg total) by mouth daily with breakfast. 90 tablet 2  . Multiple Vitamins tablet TAKE 1 TABLET BY MOUTH DAILY 30 tablet 5  . PREMARIN vaginal cream   11  . propranolol ER (INDERAL LA) 60 MG 24 hr capsule TAKE 1 CAPSULE BY MOUTH EVERY DAY 90 capsule 3  . simvastatin (ZOCOR) 40 MG tablet TAKE 1/2 TABLET BY MOUTH EVERY NIGHT AT BEDTIME 45 tablet 3   No current facility-administered medications on file prior to visit.     Past Medical History:  Diagnosis Date  . Allergy   . Diabetes mellitus without complication ([place])   . Hyperlipidemia   . Hypertension     Past Surgical History:  Procedure Laterality Date  . CESAREAN SECTION     x1  . CHOLECYSTECTOMY    . TUBAL LIGATION      Social History  Social History  . Marital status: Married    Spouse name: N/A  . Number of children: N/A  . Years of education: N/A   Social History Main Topics  . Smoking status: Former Smoker    Types: Cigarettes    Quit date: 05/01/2001  . Smokeless tobacco: Never Used     Comment: quit 12 yrs ago  . Alcohol use 0.0 oz/week     Comment: socially  . Drug use: No  . Sexual activity: Yes    Partners: Male   Other Topics Concern  . None   Social History Narrative  . None    Family History  Problem Relation Age of Onset  . COPD Mother   . Heart disease Father   . Breast cancer Sister   . Colon cancer Neg Hx   . Rectal cancer Neg Hx   . Stomach cancer Neg Hx     . Diabetes Brother     Review of Systems  Constitutional: Negative for chills and fever.  Respiratory: Negative for cough, shortness of breath and wheezing.   Cardiovascular: Positive for palpitations (occasional). Negative for chest pain and leg swelling.  Gastrointestinal: Negative for abdominal pain.       No gerd  Neurological: Negative for light-headedness and headaches.       Objective:   Vitals:   06/21/16 0811  BP: 134/82  Pulse: (!) 59  Resp: 16  Temp: 97.9 F (36.6 C)   Filed Weights   06/21/16 0811  Weight: 266 lb (120.7 kg)   Body mass index is 44.26 kg/m.  Wt Readings from Last 3 Encounters:  06/21/16 266 lb (120.7 kg)  12/23/15 267 lb (121.1 kg)  09/26/15 278 lb (126.1 kg)     Physical Exam Constitutional: Appears well-developed and well-nourished. No distress.  HENT:  Head: Normocephalic and atraumatic.  Neck: Neck supple. No tracheal deviation present. No thyromegaly present.  No cervical lymphadenopathy Cardiovascular: Normal rate, regular rhythm and normal heart sounds.   No murmur heard. No carotid bruit .  No edema Pulmonary/Chest: Effort normal and breath sounds normal. No respiratory distress. No has no wheezes. No rales.  Skin: Skin is warm and dry. Not diaphoretic.  Psychiatric: Normal mood and affect. Behavior is normal.         Assessment & Plan:   See Problem List for Assessment and Plan of chronic medical problems.

## 2016-06-20 NOTE — Patient Instructions (Addendum)

## 2016-06-21 ENCOUNTER — Encounter: Payer: Self-pay | Admitting: Internal Medicine

## 2016-06-21 ENCOUNTER — Ambulatory Visit (INDEPENDENT_AMBULATORY_CARE_PROVIDER_SITE_OTHER): Payer: Commercial Managed Care - HMO | Admitting: Internal Medicine

## 2016-06-21 ENCOUNTER — Other Ambulatory Visit (INDEPENDENT_AMBULATORY_CARE_PROVIDER_SITE_OTHER): Payer: Commercial Managed Care - HMO

## 2016-06-21 VITALS — BP 134/82 | HR 59 | Temp 97.9°F | Resp 16 | Wt 266.0 lb

## 2016-06-21 DIAGNOSIS — F418 Other specified anxiety disorders: Secondary | ICD-10-CM

## 2016-06-21 DIAGNOSIS — E118 Type 2 diabetes mellitus with unspecified complications: Secondary | ICD-10-CM | POA: Diagnosis not present

## 2016-06-21 DIAGNOSIS — E7800 Pure hypercholesterolemia, unspecified: Secondary | ICD-10-CM | POA: Diagnosis not present

## 2016-06-21 DIAGNOSIS — I10 Essential (primary) hypertension: Secondary | ICD-10-CM

## 2016-06-21 DIAGNOSIS — F329 Major depressive disorder, single episode, unspecified: Secondary | ICD-10-CM

## 2016-06-21 DIAGNOSIS — F419 Anxiety disorder, unspecified: Principal | ICD-10-CM

## 2016-06-21 LAB — COMPREHENSIVE METABOLIC PANEL
ALBUMIN: 3.9 g/dL (ref 3.5–5.2)
ALK PHOS: 51 U/L (ref 39–117)
ALT: 19 U/L (ref 0–35)
AST: 16 U/L (ref 0–37)
BILIRUBIN TOTAL: 0.8 mg/dL (ref 0.2–1.2)
BUN: 21 mg/dL (ref 6–23)
CALCIUM: 9.8 mg/dL (ref 8.4–10.5)
CO2: 32 mEq/L (ref 19–32)
CREATININE: 1.1 mg/dL (ref 0.40–1.20)
Chloride: 101 mEq/L (ref 96–112)
GFR: 54.54 mL/min — ABNORMAL LOW (ref >60.00)
Glucose, Bld: 153 mg/dL — ABNORMAL HIGH (ref 70–99)
Potassium: 4.1 mEq/L (ref 3.5–5.1)
Sodium: 140 mEq/L (ref 135–145)
TOTAL PROTEIN: 7.1 g/dL (ref 6.0–8.3)

## 2016-06-21 LAB — LIPID PANEL
CHOLESTEROL: 132 mg/dL (ref 0–200)
HDL: 38.4 mg/dL — ABNORMAL LOW (ref >39.00)
LDL Cholesterol: 71 mg/dL (ref 0–99)
NonHDL: 93.88
Total CHOL/HDL Ratio: 3
Triglycerides: 116 mg/dL (ref 0.0–149.0)
VLDL: 23.2 mg/dL (ref 0.0–40.0)

## 2016-06-21 LAB — HEMOGLOBIN A1C: HEMOGLOBIN A1C: 6.5 % (ref 4.6–6.5)

## 2016-06-21 NOTE — Assessment & Plan Note (Signed)
Check a1c Low sugar / carb diet Stressed regular exercise, weight loss  

## 2016-06-21 NOTE — Progress Notes (Signed)
Pre visit review using our clinic review tool, if applicable. No additional management support is needed unless otherwise documented below in the visit note. 

## 2016-06-21 NOTE — Assessment & Plan Note (Signed)
Controlled, stable Continue current dose of medication - only taking cymbalta twice a week Discussed that may not be effective, but she can continue at that dose

## 2016-06-21 NOTE — Assessment & Plan Note (Signed)
BP well controlled Current regimen effective and well tolerated Continue current medications at current doses cmp  

## 2016-06-21 NOTE — Assessment & Plan Note (Signed)
Continue weight loss efforts Regular exercise, decrease portions/diabetic diet

## 2016-06-21 NOTE — Assessment & Plan Note (Signed)
Check lipid panel  Continue daily statin Regular exercise and healthy diet encouraged  

## 2016-07-11 ENCOUNTER — Other Ambulatory Visit: Payer: Self-pay | Admitting: Internal Medicine

## 2016-10-10 ENCOUNTER — Other Ambulatory Visit: Payer: Self-pay | Admitting: Internal Medicine

## 2016-12-21 DIAGNOSIS — N952 Postmenopausal atrophic vaginitis: Secondary | ICD-10-CM | POA: Insufficient documentation

## 2016-12-21 NOTE — Patient Instructions (Addendum)
Test(s) ordered today. Your results will be released to [place] (or called to you) after review, usually within 72hours after test completion. If any changes need to be made, you will be notified at that same time.  All other Health Maintenance issues reviewed.   All recommended immunizations and age-appropriate screenings are up-to-date or discussed.  Flu immunization administered today.   Medications reviewed and updated.  Changes include starting ciclopirox for your toenail fungus.    Your prescription(s) have been submitted to your pharmacy. Please take as directed and contact our office if you believe you are having problem(s) with the medication(s).  A referral was ordered for pulmonary for evaluation of sleep apnea  Please followup in 6 months   Health Maintenance, Female Adopting a healthy lifestyle and getting preventive care can go a long way to promote health and wellness. Talk with your health care provider about what schedule of regular examinations is right for you. This is a good chance for you to check in with your provider about disease prevention and staying healthy. In between checkups, there are plenty of things you can do on your own. Experts have done a lot of research about which lifestyle changes and preventive measures are most likely to keep you healthy. Ask your health care provider for more information. Weight and diet Eat a healthy diet  Be sure to include plenty of vegetables, fruits, low-fat dairy products, and lean protein.  Do not eat a lot of foods high in solid fats, added sugars, or salt.  Get regular exercise. This is one of the most important things you can do for your health. ? Most adults should exercise for at least 150 minutes each week. The exercise should increase your heart rate and make you sweat (moderate-intensity exercise). ? Most adults should also do strengthening exercises at least twice a week. This is in addition to the  moderate-intensity exercise.  Maintain a healthy weight  Body mass index (BMI) is a measurement that can be used to identify possible weight problems. It estimates body fat based on height and weight. Your health care provider can help determine your BMI and help you achieve or maintain a healthy weight.  For females 42 years of age and older: ? A BMI below 18.5 is considered underweight. ? A BMI of 18.5 to 24.9 is normal. ? A BMI of 25 to 29.9 is considered overweight. ? A BMI of 30 and above is considered obese.  Watch levels of cholesterol and blood lipids  You should start having your blood tested for lipids and cholesterol at 57 years of age, then have this test every 5 years.  You may need to have your cholesterol levels checked more often if: ? Your lipid or cholesterol levels are high. ? You are older than 57 years of age. ? You are at high risk for heart disease.  Cancer screening Lung Cancer  Lung cancer screening is recommended for adults 20-54 years old who are at high risk for lung cancer because of a history of smoking.  A yearly low-dose CT scan of the lungs is recommended for people who: ? Currently smoke. ? Have quit within the past 15 years. ? Have at least a 30-pack-year history of smoking. A pack year is smoking an average of one pack of cigarettes a day for 1 year.  Yearly screening should continue until it has been 15 years since you quit.  Yearly screening should stop if you develop a health problem that  would prevent you from having lung cancer treatment.  Breast Cancer  Practice breast self-awareness. This means understanding how your breasts normally appear and feel.  It also means doing regular breast self-exams. Let your health care provider know about any changes, no matter how small.  If you are in your 20s or 30s, you should have a clinical breast exam (CBE) by a health care provider every 1-3 years as part of a regular health exam.  If you  are 54 or older, have a CBE every year. Also consider having a breast X-ray (mammogram) every year.  If you have a family history of breast cancer, talk to your health care provider about genetic screening.  If you are at high risk for breast cancer, talk to your health care provider about having an MRI and a mammogram every year.  Breast cancer gene (BRCA) assessment is recommended for women who have family members with BRCA-related cancers. BRCA-related cancers include: ? Breast. ? Ovarian. ? Tubal. ? Peritoneal cancers.  Results of the assessment will determine the need for genetic counseling and BRCA1 and BRCA2 testing.  Cervical Cancer Your health care provider may recommend that you be screened regularly for cancer of the pelvic organs (ovaries, uterus, and vagina). This screening involves a pelvic examination, including checking for microscopic changes to the surface of your cervix (Pap test). You may be encouraged to have this screening done every 3 years, beginning at age 13.  For women ages 48-65, health care providers may recommend pelvic exams and Pap testing every 3 years, or they may recommend the Pap and pelvic exam, combined with testing for human papilloma virus (HPV), every 5 years. Some types of HPV increase your risk of cervical cancer. Testing for HPV may also be done on women of any age with unclear Pap test results.  Other health care providers may not recommend any screening for nonpregnant women who are considered low risk for pelvic cancer and who do not have symptoms. Ask your health care provider if a screening pelvic exam is right for you.  If you have had past treatment for cervical cancer or a condition that could lead to cancer, you need Pap tests and screening for cancer for at least 20 years after your treatment. If Pap tests have been discontinued, your risk factors (such as having a new sexual partner) need to be reassessed to determine if screening should  resume. Some women have medical problems that increase the chance of getting cervical cancer. In these cases, your health care provider may recommend more frequent screening and Pap tests.  Colorectal Cancer  This type of cancer can be detected and often prevented.  Routine colorectal cancer screening usually begins at 57 years of age and continues through 57 years of age.  Your health care provider may recommend screening at an earlier age if you have risk factors for colon cancer.  Your health care provider may also recommend using home test kits to check for hidden blood in the stool.  A small camera at the end of a tube can be used to examine your colon directly (sigmoidoscopy or colonoscopy). This is done to check for the earliest forms of colorectal cancer.  Routine screening usually begins at age 37.  Direct examination of the colon should be repeated every 5-10 years through 57 years of age. However, you may need to be screened more often if early forms of precancerous polyps or small growths are found.  Skin Cancer  Check  your skin from head to toe regularly.  Tell your health care provider about any new moles or changes in moles, especially if there is a change in a mole's shape or color.  Also tell your health care provider if you have a mole that is larger than the size of a pencil eraser.  Always use sunscreen. Apply sunscreen liberally and repeatedly throughout the day.  Protect yourself by wearing long sleeves, pants, a wide-brimmed hat, and sunglasses whenever you are outside.  Heart disease, diabetes, and high blood pressure  High blood pressure causes heart disease and increases the risk of stroke. High blood pressure is more likely to develop in: ? People who have blood pressure in the high end of the normal range (130-139/85-89 mm Hg). ? People who are overweight or obese. ? People who are African American.  If you are 25-17 years of age, have your blood  pressure checked every 3-5 years. If you are 71 years of age or older, have your blood pressure checked every year. You should have your blood pressure measured twice-once when you are at a hospital or clinic, and once when you are not at a hospital or clinic. Record the average of the two measurements. To check your blood pressure when you are not at a hospital or clinic, you can use: ? An automated blood pressure machine at a pharmacy. ? A home blood pressure monitor.  If you are between 74 years and 89 years old, ask your health care provider if you should take aspirin to prevent strokes.  Have regular diabetes screenings. This involves taking a blood sample to check your fasting blood sugar level. ? If you are at a normal weight and have a low risk for diabetes, have this test once every three years after 57 years of age. ? If you are overweight and have a high risk for diabetes, consider being tested at a younger age or more often. Preventing infection Hepatitis B  If you have a higher risk for hepatitis B, you should be screened for this virus. You are considered at high risk for hepatitis B if: ? You were born in a country where hepatitis B is common. Ask your health care provider which countries are considered high risk. ? Your parents were born in a high-risk country, and you have not been immunized against hepatitis B (hepatitis B vaccine). ? You have HIV or AIDS. ? You use needles to inject street drugs. ? You live with someone who has hepatitis B. ? You have had sex with someone who has hepatitis B. ? You get hemodialysis treatment. ? You take certain medicines for conditions, including cancer, organ transplantation, and autoimmune conditions.  Hepatitis C  Blood testing is recommended for: ? Everyone born from [birth year] through 1966. ? Anyone with known risk factors for hepatitis C.  Sexually transmitted infections (STIs)  You should be screened for sexually transmitted  infections (STIs) including gonorrhea and chlamydia if: ? You are sexually active and are younger than 57 years of age. ? You are older than 57 years of age and your health care provider tells you that you are at risk for this type of infection. ? Your sexual activity has changed since you were last screened and you are at an increased risk for chlamydia or gonorrhea. Ask your health care provider if you are at risk.  If you do not have HIV, but are at risk, it may be recommended that you take a prescription medicine  daily to prevent HIV infection. This is called pre-exposure prophylaxis (PrEP). You are considered at risk if: ? You are sexually active and do not regularly use condoms or know the HIV status of your partner(s). ? You take drugs by injection. ? You are sexually active with a partner who has HIV.  Talk with your health care provider about whether you are at high risk of being infected with HIV. If you choose to begin PrEP, you should first be tested for HIV. You should then be tested every 3 months for as long as you are taking PrEP. Pregnancy  If you are premenopausal and you may become pregnant, ask your health care provider about preconception counseling.  If you may become pregnant, take 400 to 800 micrograms (mcg) of folic acid every day.  If you want to prevent pregnancy, talk to your health care provider about birth control (contraception). Osteoporosis and menopause  Osteoporosis is a disease in which the bones lose minerals and strength with aging. This can result in serious bone fractures. Your risk for osteoporosis can be identified using a bone density scan.  If you are 23 years of age or older, or if you are at risk for osteoporosis and fractures, ask your health care provider if you should be screened.  Ask your health care provider whether you should take a calcium or vitamin D supplement to lower your risk for osteoporosis.  Menopause may have certain physical  symptoms and risks.  Hormone replacement therapy may reduce some of these symptoms and risks. Talk to your health care provider about whether hormone replacement therapy is right for you. Follow these instructions at home:  Schedule regular health, dental, and eye exams.  Stay current with your immunizations.  Do not use any tobacco products including cigarettes, chewing tobacco, or electronic cigarettes.  If you are pregnant, do not drink alcohol.  If you are breastfeeding, limit how much and how often you drink alcohol.  Limit alcohol intake to no more than 1 drink per day for nonpregnant women. One drink equals 12 ounces of beer, 5 ounces of wine, or 1 ounces of hard liquor.  Do not use street drugs.  Do not share needles.  Ask your health care provider for help if you need support or information about quitting drugs.  Tell your health care provider if you often feel depressed.  Tell your health care provider if you have ever been abused or do not feel safe at home. This information is not intended to replace advice given to you by your health care provider. Make sure you discuss any questions you have with your health care provider. Document Released: 10/12/2010 Document Revised: 09/04/2015 Document Reviewed: 12/31/2014 Elsevier Interactive Patient Education  [employer].

## 2016-12-21 NOTE — Progress Notes (Signed)
Subjective:    Patient ID: Danielle Chase, female    DOB: [birth year], 56 y.o.   MRN: [id number]  HPI She is here for a physical exam.   She feels like she has no energy.  It has been going on for while.  She gets about 6 hrs of sleep on average.  She wakes up a lot at night.  She does go to the bathroom a lot at night.  Her sleep is not refreshing.  She does snore and her husband has mentioned she does stop breathing at night.    She has a toenail fungus and wonders how she can treat it.    Medications and allergies reviewed with patient and updated if appropriate.  Patient Active Problem List   Diagnosis Date Noted  . 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 Anxiety and depression 12/23/2015  . Morbidly obese ([place]) 09/26/2015  . 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 Carpal tunnel syndrome 06/05/2013  . Diabetes mellitus ([place]) 08/03/2011  . Hypertension 08/03/2011  . Hyperlipidemia 08/03/2011    Current Outpatient Prescriptions on File Prior to Visit  Medication Sig Dispense Refill  . aspirin 81 MG tablet Take 81 mg by mouth daily.    . DULoxetine (CYMBALTA) 20 MG capsule Take 1 capsule (20 mg total) by mouth daily. 90 capsule 1  . fluticasone (FLONASE) 50 MCG/ACT nasal spray INSTILL 2 SPRAYS IN EACH NOSTRIL DAILY (Patient taking differently: INSTILL 2 SPRAYS IN EACH NOSTRIL DAILY PRN) 48 g 1  . hydrochlorothiazide (HYDRODIURIL) 25 MG tablet TAKE 1 TABLET(25 MG) BY MOUTH DAILY 90 tablet 0  . losartan (COZAAR) 50 MG tablet TAKE 1 TABLET(50 MG) BY MOUTH DAILY 90 tablet 0  . metFORMIN (GLUCOPHAGE-XR) 500 MG 24 hr tablet TAKE 1 TABLET(500 MG) BY MOUTH DAILY WITH BREAKFAST 90 tablet 1  . Multiple Vitamins tablet TAKE 1 TABLET BY MOUTH DAILY 30 tablet 5  . PREMARIN vaginal cream   11  . propranolol ER (INDERAL LA) 60 MG 24 hr capsule TAKE 1 CAPSULE BY MOUTH EVERY DAY 90 capsule 0  . simvastatin (ZOCOR) 40 MG tablet TAKE 1/2 TABLET BY MOUTH EVERY NIGHT AT BEDTIME 45 tablet 0    No current facility-administered medications on file prior to visit.     Past Medical History:  Diagnosis Date  . Allergy   . Diabetes mellitus without complication ([place])   . Hyperlipidemia   . Hypertension     Past Surgical History:  Procedure Laterality Date  . CESAREAN SECTION     x1  . CHOLECYSTECTOMY    . TUBAL LIGATION      Social History   Social History  . Marital status: Married    Spouse name: N/A  . Number of children: N/A  . Years of education: N/A   Social History Main Topics  . Smoking status: Former Smoker    Types: Cigarettes    Quit date: 05/01/2001  . Smokeless tobacco: Never Used     Comment: quit 12 yrs ago  . Alcohol use 0.0 oz/week     Comment: socially  . Drug use: No  . Sexual activity: Yes    Partners: Male   Other Topics Concern  . Not on file   Social History Narrative  . No narrative on file    Family History  Problem Relation Age of Onset  . COPD Mother   . Heart disease Father   . Breast cancer Sister   . Colon cancer Neg  Hx   . Rectal cancer Neg Hx   . Stomach cancer Neg Hx   . Diabetes Brother     Review of Systems  Constitutional: Positive for fatigue. Negative for chills and fever.  Eyes: Negative for visual disturbance.  Respiratory: Positive for apnea. Negative for cough, shortness of breath and wheezing.   Cardiovascular: Negative for chest pain, palpitations and leg swelling.  Gastrointestinal: Negative for abdominal pain, blood in stool, constipation, diarrhea and nausea.       No gerd  Genitourinary: Negative for dysuria and hematuria.  Musculoskeletal: Positive for arthralgias. Negative for joint swelling.  Skin: Negative for color change and rash.  Neurological: Positive for dizziness (occ with quick movements) and headaches (occasional). Negative for light-headedness.  Psychiatric/Behavioral: Positive for dysphoric mood (controlled). The patient is nervous/anxious (controlled).        Objective:    Vitals:   12/22/16 0809  BP: 124/86  Pulse: 63  Resp: 16  Temp: (!) 97.3 F (36.3 C)  SpO2: 97%   Filed Weights   12/22/16 0809  Weight: 272 lb (123.4 kg)   Body mass index is 45.26 kg/m.  Wt Readings from Last 3 Encounters:  12/22/16 272 lb (123.4 kg)  06/21/16 266 lb (120.7 kg)  12/23/15 267 lb (121.1 kg)     Physical Exam Constitutional: She appears well-developed and well-nourished. No distress.  HENT:  Head: Normocephalic and atraumatic.  Right Ear: External ear normal. Normal ear canal and TM Left Ear: External ear normal.  Normal ear canal and TM Mouth/Throat: Oropharynx is clear and moist.  Eyes: Conjunctivae and EOM are normal.  Neck: Neck supple. No tracheal deviation present. No thyromegaly present.  No carotid bruit  Cardiovascular: Normal rate, regular rhythm and normal heart sounds.   No murmur heard.  No edema. Pulmonary/Chest: Effort normal and breath sounds normal. No respiratory distress. She has no wheezes. She has no rales.  Breast: deferred to Gyn Abdominal: Soft. She exhibits no distension. There is no tenderness.  Lymphadenopathy: She has no cervical adenopathy.  Skin: Skin is warm and dry. She is not diaphoretic.  Psychiatric: She has a normal mood and affect. Her behavior is normal.         Assessment & Plan:   Physical exam: Screening blood work  ordered Immunizations   Today -flu, discussed pneumovax, shingles vaccine Colonoscopy   Up to date  Mammogram  Up to date - due in November Gyn    Up to date Eye exams  [facility] - Up to date  EKG  Last done 2016 Exercise  Exercising regularly Weight  Advised weight loss Skin   No concerns Substance abuse  none  See Problem List for Assessment and Plan of chronic medical problems.   FU 6 months

## 2016-12-22 ENCOUNTER — Other Ambulatory Visit (INDEPENDENT_AMBULATORY_CARE_PROVIDER_SITE_OTHER): Payer: 59

## 2016-12-22 ENCOUNTER — Encounter: Payer: Self-pay | Admitting: Internal Medicine

## 2016-12-22 ENCOUNTER — Ambulatory Visit (INDEPENDENT_AMBULATORY_CARE_PROVIDER_SITE_OTHER): Payer: 59 | Admitting: Internal Medicine

## 2016-12-22 VITALS — BP 124/86 | HR 63 | Temp 97.3°F | Resp 16 | Ht 65.0 in | Wt 272.0 lb

## 2016-12-22 DIAGNOSIS — I10 Essential (primary) hypertension: Secondary | ICD-10-CM

## 2016-12-22 DIAGNOSIS — E785 Hyperlipidemia, unspecified: Secondary | ICD-10-CM

## 2016-12-22 DIAGNOSIS — R0681 Apnea, not elsewhere classified: Secondary | ICD-10-CM

## 2016-12-22 DIAGNOSIS — F329 Major depressive disorder, single episode, unspecified: Secondary | ICD-10-CM | POA: Diagnosis not present

## 2016-12-22 DIAGNOSIS — Z23 Encounter for immunization: Secondary | ICD-10-CM | POA: Diagnosis not present

## 2016-12-22 DIAGNOSIS — Z0000 Encounter for general adult medical examination without abnormal findings: Secondary | ICD-10-CM

## 2016-12-22 DIAGNOSIS — F419 Anxiety disorder, unspecified: Secondary | ICD-10-CM

## 2016-12-22 DIAGNOSIS — B351 Tinea unguium: Secondary | ICD-10-CM | POA: Diagnosis not present

## 2016-12-22 DIAGNOSIS — R5383 Other fatigue: Secondary | ICD-10-CM

## 2016-12-22 DIAGNOSIS — G4733 Obstructive sleep apnea (adult) (pediatric): Secondary | ICD-10-CM | POA: Insufficient documentation

## 2016-12-22 DIAGNOSIS — R0683 Snoring: Secondary | ICD-10-CM | POA: Diagnosis not present

## 2016-12-22 DIAGNOSIS — E118 Type 2 diabetes mellitus with unspecified complications: Secondary | ICD-10-CM

## 2016-12-22 LAB — CBC WITH DIFFERENTIAL/PLATELET
BASOS ABS: 0.1 10*3/uL (ref 0.0–0.1)
Basophils Relative: 1.1 % (ref 0.0–3.0)
Eosinophils Absolute: 0.2 10*3/uL (ref 0.0–0.7)
Eosinophils Relative: 3.4 % (ref 0.0–5.0)
HCT: 43 % (ref 36.0–46.0)
HEMOGLOBIN: 15 g/dL (ref 12.0–15.0)
LYMPHS ABS: 1.4 10*3/uL (ref 0.7–4.0)
Lymphocytes Relative: 22.5 % (ref 12.0–46.0)
MCHC: 35 g/dL (ref 30.0–36.0)
MCV: 94.9 fl (ref 78.0–100.0)
MONO ABS: 0.8 10*3/uL (ref 0.1–1.0)
Monocytes Relative: 13.9 % — ABNORMAL HIGH (ref 3.0–12.0)
NEUTROS PCT: 59.1 % (ref 43.0–77.0)
Neutro Abs: 3.6 10*3/uL (ref 1.4–7.7)
Platelets: 204 10*3/uL (ref 150.0–400.0)
RBC: 4.53 Mil/uL (ref 3.87–5.11)
RDW: 13.1 % (ref 11.5–15.5)
WBC: 6.1 10*3/uL (ref 4.0–10.5)

## 2016-12-22 LAB — HEMOGLOBIN A1C: HEMOGLOBIN A1C: 6.7 % — AB (ref 4.6–6.5)

## 2016-12-22 LAB — LIPID PANEL
Cholesterol: 134 mg/dL (ref 0–200)
HDL: 51.5 mg/dL (ref >39.00)
LDL CALC: 58 mg/dL (ref 0–99)
NonHDL: 82.91
TRIGLYCERIDES: 123 mg/dL (ref 0.0–149.0)
Total CHOL/HDL Ratio: 3
VLDL: 24.6 mg/dL (ref 0.0–40.0)

## 2016-12-22 LAB — COMPREHENSIVE METABOLIC PANEL
ALT: 22 U/L (ref 0–35)
AST: 18 U/L (ref 0–37)
Albumin: 3.9 g/dL (ref 3.5–5.2)
Alkaline Phosphatase: 53 U/L (ref 39–117)
BILIRUBIN TOTAL: 0.9 mg/dL (ref 0.2–1.2)
BUN: 15 mg/dL (ref 6–23)
CO2: 31 mEq/L (ref 19–32)
CREATININE: 1.14 mg/dL (ref 0.40–1.20)
Calcium: 9.6 mg/dL (ref 8.4–10.5)
Chloride: 100 mEq/L (ref 96–112)
GFR: 52.25 mL/min — ABNORMAL LOW (ref >60.00)
GLUCOSE: 175 mg/dL — AB (ref 70–99)
Potassium: 4 mEq/L (ref 3.5–5.1)
SODIUM: 140 meq/L (ref 135–145)
TOTAL PROTEIN: 7.1 g/dL (ref 6.0–8.3)

## 2016-12-22 LAB — TSH: TSH: 2.66 u[IU]/mL (ref 0.35–4.50)

## 2016-12-22 MED ORDER — CICLOPIROX 8 % EX SOLN: Freq: Every day | CUTANEOUS | 2 refills | Status: DC

## 2016-12-22 NOTE — Assessment & Plan Note (Signed)
Compliant with a low sugars, doing ok with carbs She is walking 2/ day at work during her breaks, does some weights and ski machine Check a1c Low sugar / carb diet Stressed regular exercise, weight loss

## 2016-12-22 NOTE — Assessment & Plan Note (Signed)
Check lipid panel  Continue daily statin Regular exercise and healthy diet encouraged  

## 2016-12-22 NOTE — Assessment & Plan Note (Signed)
Controlled, stable Continue current dose of medication  

## 2016-12-22 NOTE — Assessment & Plan Note (Signed)
Stressed weight loss Can consider adding trulicity or other dm med that may help promote weight loss Continue regular exercise Healthy diet, decreased portions

## 2016-12-22 NOTE — Assessment & Plan Note (Signed)
Associated with fatigue and snoring Will refer to pulm for assessment of OSA Advised weight loss

## 2016-12-22 NOTE — Assessment & Plan Note (Signed)
BP well controlled Current regimen effective and well tolerated Continue current medications at current doses cmp  

## 2016-12-22 NOTE — Assessment & Plan Note (Signed)
Will try ciclopirox

## 2016-12-24 ENCOUNTER — Encounter: Payer: Self-pay | Admitting: Internal Medicine

## 2016-12-28 ENCOUNTER — Encounter: Payer: Self-pay | Admitting: Internal Medicine

## 2017-01-09 ENCOUNTER — Other Ambulatory Visit: Payer: Self-pay | Admitting: Internal Medicine

## 2017-01-17 ENCOUNTER — Encounter: Payer: Self-pay | Admitting: Internal Medicine

## 2017-01-18 ENCOUNTER — Other Ambulatory Visit: Payer: Self-pay | Admitting: Emergency Medicine

## 2017-01-18 MED ORDER — FLUTICASONE PROPIONATE 50 MCG/ACT NA SUSP: NASAL | 1 refills | Status: DC

## 2017-02-16 ENCOUNTER — Telehealth: Payer: 59 | Admitting: Family

## 2017-02-16 DIAGNOSIS — J069 Acute upper respiratory infection, unspecified: Secondary | ICD-10-CM

## 2017-02-16 MED ORDER — BENZONATATE 100 MG PO CAPS: 100.0000 mg | ORAL_CAPSULE | Freq: Three times a day (TID) | ORAL | 0 refills | Status: DC | PRN

## 2017-02-16 NOTE — Progress Notes (Signed)

## 2017-02-25 DIAGNOSIS — J069 Acute upper respiratory infection, unspecified: Secondary | ICD-10-CM | POA: Diagnosis not present

## 2017-02-25 DIAGNOSIS — Z01419 Encounter for gynecological examination (general) (routine) without abnormal findings: Secondary | ICD-10-CM | POA: Diagnosis not present

## 2017-02-25 DIAGNOSIS — Z1231 Encounter for screening mammogram for malignant neoplasm of breast: Secondary | ICD-10-CM | POA: Diagnosis not present

## 2017-03-08 ENCOUNTER — Encounter: Payer: Self-pay | Admitting: Pulmonary Disease

## 2017-03-08 ENCOUNTER — Ambulatory Visit: Payer: 59 | Admitting: Pulmonary Disease

## 2017-03-08 VITALS — BP 130/60 | HR 68 | Ht 64.0 in | Wt 276.0 lb

## 2017-03-08 DIAGNOSIS — G4733 Obstructive sleep apnea (adult) (pediatric): Secondary | ICD-10-CM

## 2017-03-08 NOTE — Assessment & Plan Note (Signed)
Given excessive daytime somnolence, narrow pharyngeal exam, witnessed apneas & loud snoring, obstructive sleep apnea is very likely & an overnight polysomnogram will be scheduled as a home study. The pathophysiology of obstructive sleep apnea , it's cardiovascular consequences & modes of treatment including CPAP were discused with the patient in detail & they evidenced understanding.  Pretest probability is intermediate  

## 2017-03-08 NOTE — Progress Notes (Signed)
Subjective:    Patient ID: Danielle Chase, female    DOB: [birth year], 56 y.o.   MRN: [id number]  HPI   56 year old obese diabetic hypertensive presents for evaluation of sleep disordered breathing. Family members have noted loud snoring she reports increased snoring especially on her back and occasional snorting episodes that have woken her up from sleep.  Epworth sleepiness score is 10 and she reports sleepiness while sitting and reading, lying down to rest in the afternoons or sitting quietly after lunch. Bedtime is between 11 PM and midnight, sleep latency is minimal, she sleeps on her side with 2 pillows, sleeping on her back makes things worse, reports 2-3 nocturnal awakenings including nocturia and is out of bed by 6 AM feeling tired with dryness of mouth but denies a headache.  On weekends she will sleep later until 8 AM and often also naps for about an hour but still does not feel fully rested. She has gained 20-25 pounds over the last 2 years. She drinks 2 cups of coffee in the morning and has another cup of tea or coffee in the afternoon and tries to avoid sodas  There is no history suggestive of cataplexy, sleep paralysis or parasomnias Her son and sister have OSA on CPAP therapy.  She quit smoking 12 years ago, smoked about a pack per day for 25 years  Past Medical History:  Diagnosis Date  . Allergy   . Diabetes mellitus without complication ([place])   . Hyperlipidemia   . Hypertension    Past Surgical History:  Procedure Laterality Date  . CESAREAN SECTION     x1  . CHOLECYSTECTOMY    . TUBAL LIGATION      No Known Allergies   Social History   Socioeconomic History  . Marital status: Married    Spouse name: Not on file  . Number of children: Not on file  . Years of education: Not on file  . Highest education level: Not on file  Social Needs  . Financial resource strain: Not on file  . Food insecurity - worry: Not on file  . Food insecurity - inability:  Not on file  . Transportation needs - medical: Not on file  . Transportation needs - non-medical: Not on file  Occupational History  . Not on file  Tobacco Use  . Smoking status: Former Smoker    Types: Cigarettes    Last attempt to quit: 05/01/2001    Years since quitting: 15.8  . Smokeless tobacco: Never Used  . Tobacco comment: quit 12 yrs ago  Substance and Sexual Activity  . Alcohol use: Yes    Alcohol/week: 0.0 oz    Comment: socially  . Drug use: No  . Sexual activity: Yes    Partners: Male  Other Topics Concern  . Not on file  Social History Narrative  . Not on file     Family History  Problem Relation Age of Onset  . COPD Mother   . Heart disease Father   . Breast cancer Sister   . Colon cancer Neg Hx   . Rectal cancer Neg Hx   . Stomach cancer Neg Hx   . Diabetes Brother      Review of Systems Positive for cough and nasal congestion which is improving  Constitutional: negative for anorexia, fevers and sweats  Eyes: negative for irritation, redness and visual disturbance  Ears, nose, mouth, throat, and face: negative for earaches, epistaxis, nasal congestion and sore throat  Respiratory: negative for cough, dyspnea on exertion, sputum and wheezing  Cardiovascular: negative for chest pain, dyspnea, lower extremity edema, orthopnea, palpitations and syncope  Gastrointestinal: negative for abdominal pain, constipation, diarrhea, melena, nausea and vomiting  Genitourinary:negative for dysuria, frequency and hematuria  Hematologic/lymphatic: negative for bleeding, easy bruising and lymphadenopathy  Musculoskeletal:negative for arthralgias, muscle weakness and stiff joints  Neurological: negative for coordination problems, gait problems, headaches and weakness  Endocrine: negative for diabetic symptoms including polydipsia, polyuria and weight loss      Objective:   Physical Exam  Gen. Pleasant, obese, in no distress, normal affect ENT - no lesions, no  post nasal drip, class 2-3 airway Neck: No JVD, no thyromegaly, no carotid bruits Lungs: no use of accessory muscles, no dullness to percussion, decreased without rales or rhonchi  Cardiovascular: Rhythm regular, heart sounds  normal, no murmurs or gallops, no peripheral edema Abdomen: soft and non-tender, no hepatosplenomegaly, BS normal. Musculoskeletal: No deformities, no cyanosis or clubbing Neuro:  alert, non focal, no tremors        Assessment & Plan:

## 2017-03-08 NOTE — Patient Instructions (Signed)
Home sleep study will be scheduled

## 2017-03-25 DIAGNOSIS — G4733 Obstructive sleep apnea (adult) (pediatric): Secondary | ICD-10-CM | POA: Diagnosis not present

## 2017-03-26 DIAGNOSIS — G4733 Obstructive sleep apnea (adult) (pediatric): Secondary | ICD-10-CM | POA: Diagnosis not present

## 2017-03-28 ENCOUNTER — Telehealth: Payer: Self-pay | Admitting: Pulmonary Disease

## 2017-03-28 DIAGNOSIS — G4733 Obstructive sleep apnea (adult) (pediatric): Secondary | ICD-10-CM

## 2017-03-28 NOTE — Telephone Encounter (Signed)
Per RA, HST showed severe OSA 48 events per hour. Recommends Autocpap 5-15cm, mask of choice, humidity, DL and OV in 4 weeks with TP.

## 2017-03-30 ENCOUNTER — Other Ambulatory Visit: Payer: Self-pay | Admitting: *Deleted

## 2017-03-30 DIAGNOSIS — G4733 Obstructive sleep apnea (adult) (pediatric): Secondary | ICD-10-CM

## 2017-03-30 NOTE — Telephone Encounter (Signed)
Spoke with patient. She is aware of results. Wishes to proceed with CPAP therapy. She wants to call back after the first of the year to schedule her follow up appt.   Will go ahead and place order for CPAP machine.

## 2017-04-18 LAB — HM DIABETES EYE EXAM

## 2017-04-20 LAB — HM DIABETES EYE EXAM

## 2017-04-29 DIAGNOSIS — G4733 Obstructive sleep apnea (adult) (pediatric): Secondary | ICD-10-CM | POA: Diagnosis not present

## 2017-05-03 ENCOUNTER — Other Ambulatory Visit: Payer: Self-pay | Admitting: Internal Medicine

## 2017-05-04 ENCOUNTER — Encounter: Payer: Self-pay | Admitting: Internal Medicine

## 2017-05-06 ENCOUNTER — Encounter: Payer: Self-pay | Admitting: Family

## 2017-05-06 ENCOUNTER — Other Ambulatory Visit: Payer: 59

## 2017-05-06 ENCOUNTER — Ambulatory Visit: Payer: 59 | Admitting: Family

## 2017-05-06 VITALS — BP 128/74 | HR 62 | Temp 98.0°F | Wt 277.0 lb

## 2017-05-06 DIAGNOSIS — R300 Dysuria: Secondary | ICD-10-CM

## 2017-05-06 DIAGNOSIS — N390 Urinary tract infection, site not specified: Secondary | ICD-10-CM

## 2017-05-06 LAB — POC URINALSYSI DIPSTICK (AUTOMATED)
Bilirubin, UA: NEGATIVE
Blood, UA: NEGATIVE
Glucose, UA: NEGATIVE
Ketones, UA: NEGATIVE
NITRITE UA: NEGATIVE
PH UA: 6 (ref 5.0–8.0)
Protein, UA: NEGATIVE
Spec Grav, UA: 1.015 (ref 1.010–1.025)
UROBILINOGEN UA: 0.2 U/dL

## 2017-05-06 MED ORDER — NITROFURANTOIN MONOHYD MACRO 100 MG PO CAPS: 100.0000 mg | ORAL_CAPSULE | Freq: Two times a day (BID) | ORAL | 0 refills | Status: DC

## 2017-05-06 NOTE — Addendum Note (Signed)
Addended by: Karren Cobble on: 05/06/2017 04:38 PM   Modules accepted: Orders, SmartSet

## 2017-05-06 NOTE — Progress Notes (Signed)
Danielle Chase is a 57 y.o. female with the following history as recorded in Epic[facility]:  Patient Active Problem List   Diagnosis Date Noted  . Onychomycosis 12/22/2016  . OSA (obstructive sleep apnea) 12/22/2016  . 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 Anxiety and depression 12/23/2015  . Morbidly obese ([place]) 09/26/2015  . 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 Carpal tunnel syndrome 06/05/2013  . Diabetes mellitus ([place]) 08/03/2011  . Hypertension 08/03/2011  . Hyperlipidemia 08/03/2011    Current Outpatient Medications  Medication Sig Dispense Refill  . aspirin 81 MG tablet Take 81 mg by mouth daily.    . ciclopirox (PENLAC) 8 % solution Apply topically at bedtime. Apply over nail & surrounding skin. Apply QD over previous coat. After 7 days, may remove w/ alcohol & continue 6.6 mL 2  . DULoxetine (CYMBALTA) 20 MG capsule TAKE 1 CAPSULE(20 MG) BY MOUTH DAILY 90 capsule 1  . fluticasone (FLONASE) 50 MCG/ACT nasal spray INSTILL 2 SPRAYS IN EACH NOSTRIL DAILY PRN 48 g 1  . hydrochlorothiazide (HYDRODIURIL) 25 MG tablet TAKE 1 TABLET(25 MG) BY MOUTH DAILY 90 tablet 1  . losartan (COZAAR) 50 MG tablet TAKE 1 TABLET(50 MG) BY MOUTH DAILY 90 tablet 0  . metFORMIN (GLUCOPHAGE-XR) 500 MG 24 hr tablet TAKE 1 TABLET(500 MG) BY MOUTH DAILY WITH BREAKFAST 90 tablet 1  . Multiple Vitamins tablet TAKE 1 TABLET BY MOUTH DAILY 30 tablet 5  . propranolol ER (INDERAL LA) 60 MG 24 hr capsule TAKE 1 CAPSULE BY MOUTH EVERY DAY 90 capsule 1  . simvastatin (ZOCOR) 40 MG tablet TAKE 1/2 TABLET BY MOUTH EVERY NIGHT AT BEDTIME 45 tablet 1  . nitrofurantoin, macrocrystal-monohydrate, (MACROBID) 100 MG capsule Take 1 capsule (100 mg total) by mouth 2 (two) times daily. 14 capsule 0   No current facility-administered medications for this visit.     Allergies: Patient has no known allergies.  Past Medical History:  Diagnosis Date  . Allergy   . Diabetes mellitus without complication ([place])   .  Hyperlipidemia   . Hypertension     Past Surgical History:  Procedure Laterality Date  . CESAREAN SECTION     x1  . CHOLECYSTECTOMY    . TUBAL LIGATION      Family History  Problem Relation Age of Onset  . COPD Mother   . Heart disease Father   . Breast cancer Sister   . Colon cancer Neg Hx   . Rectal cancer Neg Hx   . Stomach cancer Neg Hx   . Diabetes Brother     Social History   Tobacco Use  . Smoking status: Former Smoker    Types: Cigarettes    Last attempt to quit: 05/01/2001    Years since quitting: 16.0  . Smokeless tobacco: Never Used  . Tobacco comment: quit 12 yrs ago  Substance Use Topics  . Alcohol use: Yes    Alcohol/week: 0.0 oz    Comment: socially    Subjective:  Patient presents with concerns for UTI; Urgency, frequency, small amounts of urine; no back pain or fever; no blood in urine; prone to UTIs; symptoms present x 2-3 days; seemed to be worse this morning- " I just didn't feel good."  Objective:  Vitals:   05/06/17 1422  BP: 128/74  Pulse: 62  Temp: 98 F (36.7 C)  SpO2: 98%  Weight: 277 lb (125.6 kg)    General: Well developed, well nourished, in no acute distress  Skin : Warm  and dry.  Lungs: Respirations unlabored; clear to auscultation bilaterally without wheeze, rales, rhonchi  CVS exam: normal rate and regular rhythm.  Musculoskeletal: No deformities; no active joint inflammation; negative CVA tenderness  Neurologic: Alert and oriented; speech intact; face symmetrical; moves all extremities well; CNII-XII intact without focal deficit  Assessment:  1. Dysuria   2. Urinary tract infection without hematuria, site unspecified     Plan:  Check U/A and urine culture; Rx for Macrobid 100 mg bid x 7 days; increase water; follow-up worse, no better.   No Follow-up on file.  Orders Placed This Encounter  Procedures  . POCT Urinalysis Dipstick (Automated)    Requested Prescriptions   Signed Prescriptions Disp Refills  .  nitrofurantoin, macrocrystal-monohydrate, (MACROBID) 100 MG capsule 14 capsule 0    Sig: Take 1 capsule (100 mg total) by mouth 2 (two) times daily.

## 2017-05-09 ENCOUNTER — Other Ambulatory Visit: Payer: Self-pay | Admitting: Family

## 2017-05-09 LAB — URINE CULTURE
MICRO NUMBER:: 90108842
SPECIMEN QUALITY:: ADEQUATE

## 2017-05-09 MED ORDER — SULFAMETHOXAZOLE-TRIMETHOPRIM 800-160 MG PO TABS: 1.0000 | ORAL_TABLET | Freq: Two times a day (BID) | ORAL | 0 refills | Status: DC

## 2017-05-30 DIAGNOSIS — G4733 Obstructive sleep apnea (adult) (pediatric): Secondary | ICD-10-CM | POA: Diagnosis not present

## 2017-06-19 NOTE — Progress Notes (Signed)
Subjective:    Patient ID: Danielle Chase, female    DOB: 02-Oct-1960, 57 y.o.   MRN: [id number]  HPI The patient is here for follow up.  Hypertension: She is taking her medication daily. She is compliant with a low sodium diet.  She denies chest pain, palpitations, edema, shortness of breath and regular headaches. She is not exercising regularly.  She does not monitor her blood pressure at home.    Diabetes: She is taking her medication daily as prescribed. She is not compliant with a diabetic diet. She is not exercising regularly.  She checks  denies foot lesions. She is up-to-date with an ophthalmology examination.   Hyperlipidemia: She is taking her medication daily. She is fairly compliant with a low fat/cholesterol diet. She is not exercising regularly. She denies myalgias.   Depression, anxiety: She is taking her medication daily as prescribed. She denies any side effects from the medication. She feels her depression and anxiety are well controlled and she is happy with her current dose of medication.     Medications and allergies reviewed with patient and updated if appropriate.  Patient Active Problem List   Diagnosis Date Noted  . Onychomycosis 12/22/2016  . OSA (obstructive sleep apnea) 12/22/2016  . 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 Anxiety and depression 12/23/2015  . Morbidly obese ([place]) 09/26/2015  . 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 Carpal tunnel syndrome 06/05/2013  . Diabetes mellitus ([place]) 08/03/2011  . Hypertension 08/03/2011  . Hyperlipidemia 08/03/2011    Current Outpatient Medications on File Prior to Visit  Medication Sig Dispense Refill  . aspirin 81 MG tablet Take 81 mg by mouth daily.    . ciclopirox (PENLAC) 8 % solution Apply topically at bedtime. Apply over nail & surrounding skin. Apply QD over previous coat. After 7 days, may remove w/ alcohol & continue 6.6 mL 2  . DULoxetine (CYMBALTA) 20 MG capsule TAKE 1 CAPSULE(20 MG) BY  MOUTH DAILY 90 capsule 1  . fluticasone (FLONASE) 50 MCG/ACT nasal spray INSTILL 2 SPRAYS IN EACH NOSTRIL DAILY PRN 48 g 1  . hydrochlorothiazide (HYDRODIURIL) 25 MG tablet TAKE 1 TABLET(25 MG) BY MOUTH DAILY 90 tablet 1  . losartan (COZAAR) 50 MG tablet TAKE 1 TABLET(50 MG) BY MOUTH DAILY 90 tablet 0  . metFORMIN (GLUCOPHAGE-XR) 500 MG 24 hr tablet TAKE 1 TABLET(500 MG) BY MOUTH DAILY WITH BREAKFAST 90 tablet 1  . Multiple Vitamins tablet TAKE 1 TABLET BY MOUTH DAILY 30 tablet 5  . propranolol ER (INDERAL LA) 60 MG 24 hr capsule TAKE 1 CAPSULE BY MOUTH EVERY DAY 90 capsule 1  . simvastatin (ZOCOR) 40 MG tablet TAKE 1/2 TABLET BY MOUTH EVERY NIGHT AT BEDTIME 45 tablet 1   No current facility-administered medications on file prior to visit.     Past Medical History:  Diagnosis Date  . Allergy   . Diabetes mellitus without complication ([place])   . Hyperlipidemia   . Hypertension     Past Surgical History:  Procedure Laterality Date  . CESAREAN SECTION     x1  . CHOLECYSTECTOMY    . TUBAL LIGATION      Social History   Socioeconomic History  . Marital status: Married    Spouse name: None  . Number of children: None  . Years of education: None  . Highest education level: None  Social Needs  . Financial resource strain: None  . Food insecurity - worry: None  . Food insecurity -  inability: None  . Transportation needs - medical: None  . Transportation needs - non-medical: None  Occupational History  . None  Tobacco Use  . Smoking status: Former Smoker    Types: Cigarettes    Last attempt to quit: 05/01/2001    Years since quitting: 16.1  . Smokeless tobacco: Never Used  . Tobacco comment: quit 12 yrs ago  Substance and Sexual Activity  . Alcohol use: Yes    Alcohol/week: 0.0 oz    Comment: socially  . Drug use: No  . Sexual activity: Yes    Partners: Male  Other Topics Concern  . None  Social History Narrative  . None    Family History  Problem Relation Age of  Onset  . COPD Mother   . Heart disease Father   . Breast cancer Sister   . Colon cancer Neg Hx   . Rectal cancer Neg Hx   . Stomach cancer Neg Hx   . Diabetes Brother     Review of Systems  Constitutional: Negative for chills and fever.  Respiratory: Negative for cough, shortness of breath and wheezing.   Cardiovascular: Negative for chest pain, palpitations and leg swelling.  Neurological: Negative for light-headedness and headaches.       Objective:   Vitals:   06/21/17 0841  BP: 122/84  Pulse: 76  Resp: 16  Temp: 98.1 F (36.7 C)  SpO2: 96%   BP Readings from Last 3 Encounters:  06/21/17 122/84  05/06/17 128/74  03/08/17 130/60   Wt Readings from Last 3 Encounters:  06/21/17 280 lb (127 kg)  05/06/17 277 lb (125.6 kg)  03/08/17 276 lb (125.2 kg)   Body mass index is 48.06 kg/m.   Physical Exam    Constitutional: Appears well-developed and well-nourished. No distress.  HENT:  Head: Normocephalic and atraumatic.  Neck: Neck supple. No tracheal deviation present. No thyromegaly present.  No cervical lymphadenopathy Cardiovascular: Normal rate, regular rhythm and normal heart sounds.   No murmur heard. No carotid bruit .  No edema Pulmonary/Chest: Effort normal and breath sounds normal. No respiratory distress. No has no wheezes. No rales.  Skin: Skin is warm and dry. Not diaphoretic.  Psychiatric: Normal mood and affect. Behavior is normal.   Diabetic Foot Exam - Simple   Simple Foot Form Diabetic Foot exam was performed with the following findings:  Yes 06/21/2017  9:12 AM  Visual Inspection No deformities, no ulcerations, no other skin breakdown bilaterally:  Yes Sensation Testing Intact to touch and monofilament testing bilaterally:  Yes Pulse Check Posterior Tibialis and Dorsalis pulse intact bilaterally:  Yes Comments       Assessment & Plan:    See Problem List for Assessment and Plan of chronic medical problems.

## 2017-06-19 NOTE — Patient Instructions (Addendum)

## 2017-06-21 ENCOUNTER — Ambulatory Visit: Payer: 59 | Admitting: Internal Medicine

## 2017-06-21 ENCOUNTER — Other Ambulatory Visit (INDEPENDENT_AMBULATORY_CARE_PROVIDER_SITE_OTHER): Payer: 59

## 2017-06-21 ENCOUNTER — Encounter: Payer: Self-pay | Admitting: Internal Medicine

## 2017-06-21 VITALS — BP 122/84 | HR 76 | Temp 98.1°F | Resp 16 | Wt 280.0 lb

## 2017-06-21 DIAGNOSIS — I10 Essential (primary) hypertension: Secondary | ICD-10-CM | POA: Diagnosis not present

## 2017-06-21 DIAGNOSIS — E118 Type 2 diabetes mellitus with unspecified complications: Secondary | ICD-10-CM

## 2017-06-21 DIAGNOSIS — E785 Hyperlipidemia, unspecified: Secondary | ICD-10-CM

## 2017-06-21 DIAGNOSIS — F329 Major depressive disorder, single episode, unspecified: Secondary | ICD-10-CM | POA: Diagnosis not present

## 2017-06-21 DIAGNOSIS — F419 Anxiety disorder, unspecified: Secondary | ICD-10-CM

## 2017-06-21 DIAGNOSIS — F32A Depression, unspecified: Secondary | ICD-10-CM

## 2017-06-21 LAB — COMPREHENSIVE METABOLIC PANEL
ALBUMIN: 3.9 g/dL (ref 3.5–5.2)
ALK PHOS: 49 U/L (ref 39–117)
ALT: 22 U/L (ref 0–35)
AST: 17 U/L (ref 0–37)
BILIRUBIN TOTAL: 0.9 mg/dL (ref 0.2–1.2)
BUN: 16 mg/dL (ref 6–23)
CALCIUM: 9.5 mg/dL (ref 8.4–10.5)
CO2: 31 mEq/L (ref 19–32)
CREATININE: 1.03 mg/dL (ref 0.40–1.20)
Chloride: 99 mEq/L (ref 96–112)
GFR: 58.63 mL/min — ABNORMAL LOW (ref >60.00)
Glucose, Bld: 209 mg/dL — ABNORMAL HIGH (ref 70–99)
Potassium: 3.6 mEq/L (ref 3.5–5.1)
Sodium: 137 mEq/L (ref 135–145)
Total Protein: 7.3 g/dL (ref 6.0–8.3)

## 2017-06-21 LAB — LIPID PANEL
CHOLESTEROL: 135 mg/dL (ref 0–200)
HDL: 41.8 mg/dL (ref >39.00)
LDL Cholesterol: 65 mg/dL (ref 0–99)
NonHDL: 93.65
TRIGLYCERIDES: 144 mg/dL (ref 0.0–149.0)
Total CHOL/HDL Ratio: 3
VLDL: 28.8 mg/dL (ref 0.0–40.0)

## 2017-06-21 LAB — HEMOGLOBIN A1C: HEMOGLOBIN A1C: 7.3 % — AB (ref 4.6–6.5)

## 2017-06-21 NOTE — Assessment & Plan Note (Signed)
Check lipid panel  Continue daily statin Regular exercise and healthy diet encouraged  

## 2017-06-21 NOTE — Assessment & Plan Note (Signed)
BP well controlled Current regimen effective and well tolerated Continue current medications at current doses cmp  

## 2017-06-21 NOTE — Assessment & Plan Note (Signed)
Controlled, stable Continue current dose of medication  

## 2017-06-21 NOTE — Assessment & Plan Note (Signed)
Check a1c Low sugar / carb diet Stressed regular exercise, weight loss  

## 2017-06-23 ENCOUNTER — Encounter: Payer: Self-pay | Admitting: Internal Medicine

## 2017-06-24 ENCOUNTER — Ambulatory Visit: Payer: 59 | Admitting: Pulmonary Disease

## 2017-06-27 DIAGNOSIS — G4733 Obstructive sleep apnea (adult) (pediatric): Secondary | ICD-10-CM | POA: Diagnosis not present

## 2017-06-28 ENCOUNTER — Encounter: Payer: Self-pay | Admitting: Pulmonary Disease

## 2017-06-28 ENCOUNTER — Ambulatory Visit: Payer: 59 | Admitting: Pulmonary Disease

## 2017-06-28 DIAGNOSIS — G4733 Obstructive sleep apnea (adult) (pediatric): Secondary | ICD-10-CM | POA: Diagnosis not present

## 2017-06-28 NOTE — Assessment & Plan Note (Signed)
Weight loss of at least 10%, about 25 pounds set as goal for the next year

## 2017-06-28 NOTE — Assessment & Plan Note (Signed)
Average auto CPAP pressure was 13 cm he seems to have good control of events and seems to be settling down well. Compliance is good.   Weight loss encouraged, compliance with goal of at least 4-6 hrs every night is the expectation. Advised against medications with sedative side effects Cautioned against driving when sleepy - understanding that sleepiness will vary on a day to day basis

## 2017-06-28 NOTE — Patient Instructions (Signed)
You are on auto CPAP settings. CPAP seems to be working well in controlling events.

## 2017-06-28 NOTE — Progress Notes (Signed)
   Subjective:    Patient ID: Danielle Chase, female    DOB: November 24, 1960, 57 y.o.   MRN: [id number]  HPI  57 year old obese diabetic hypertensive for follow-up of severe OSA. She was started on auto CPAP in January 2019 and seems to be settling down well with this. She started with a nasal mask but has now settled on a full facemask, seems to be air fit F 30.  No problems with nasal congestion or dryness of mouth, pressure seems to be okay. Download was reviewed which shows good compliance about 5.5 hours every night, with no residual events and minimal leak.  She has noticed improvement in her daytime somnolence and fatigue. Blood pressure is well controlled   Significant tests/ events reviewed  HST >> severe OSA 48/h  Review of Systems Patient denies significant dyspnea,cough, hemoptysis,  chest pain, palpitations, pedal edema, orthopnea, paroxysmal nocturnal dyspnea, lightheadedness, nausea, vomiting, abdominal or  leg pains      Objective:   Physical Exam   Gen. Pleasant, obese, in no distress ENT - no lesions, no post nasal drip Neck: No JVD, no thyromegaly, no carotid bruits Lungs: no use of accessory muscles, no dullness to percussion, decreased without rales or rhonchi  Cardiovascular: Rhythm regular, heart sounds  normal, no murmurs or gallops, no peripheral edema Musculoskeletal: No deformities, no cyanosis or clubbing , no tremors        Assessment & Plan:

## 2017-07-04 NOTE — Progress Notes (Signed)
Subjective:    Patient ID: Danielle Chase, female    DOB: 02-11-1961, 57 y.o.   MRN: [id number]  HPI The patient is here for an acute visit.  Right thumb pain:  She was doing hand exercises when she was walking, which is something she typically does.  She was just rotating her wrist and opening and closing her hand.  She had a " catch" in her thenar process that was painful-this occurred 10 days ago.  The medial and anterior aspects of the thumb have been numb since then.  If she pushes on the thenar process the pain radiates up to the tip of her thumb.  The thumb is painful constantly at an intensity of 6/10.  She has taken advil.  It has gotten a little better.  She has to take the Advil on a regular basis in order to get relief.  She has dropped things due to the pain in her thumb.   She denies wrist pain.  Her other fingers are not affected.  She denies any swelling or bruising.    She is using a thumb brace.  She did ice it.   She types a lot at work.   She denies any other unusual activities or injuries.   Medications and allergies reviewed with patient and updated if appropriate.  Patient Active Problem List   Diagnosis Date Noted  . Onychomycosis 12/22/2016  . OSA (obstructive sleep apnea) 12/22/2016  . 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 Anxiety and depression 12/23/2015  . Morbidly obese ([place]) 09/26/2015  . 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 Carpal tunnel syndrome 06/05/2013  . Diabetes mellitus ([place]) 08/03/2011  . Hypertension 08/03/2011  . Hyperlipidemia 08/03/2011    Current Outpatient Medications on File Prior to Visit  Medication Sig Dispense Refill  . aspirin 81 MG tablet Take 81 mg by mouth daily.    . ciclopirox (PENLAC) 8 % solution Apply topically at bedtime. Apply over nail & surrounding skin. Apply QD over previous coat. After 7 days, may remove w/ alcohol & continue 6.6 mL 2  . DULoxetine (CYMBALTA) 20 MG capsule TAKE 1 CAPSULE(20 MG) BY  MOUTH DAILY 90 capsule 1  . fluticasone (FLONASE) 50 MCG/ACT nasal spray INSTILL 2 SPRAYS IN EACH NOSTRIL DAILY PRN 48 g 1  . hydrochlorothiazide (HYDRODIURIL) 25 MG tablet TAKE 1 TABLET(25 MG) BY MOUTH DAILY 90 tablet 1  . losartan (COZAAR) 50 MG tablet TAKE 1 TABLET(50 MG) BY MOUTH DAILY 90 tablet 0  . metFORMIN (GLUCOPHAGE-XR) 500 MG 24 hr tablet TAKE 1 TABLET(500 MG) BY MOUTH DAILY WITH BREAKFAST 90 tablet 1  . Multiple Vitamins tablet TAKE 1 TABLET BY MOUTH DAILY 30 tablet 5  . propranolol ER (INDERAL LA) 60 MG 24 hr capsule TAKE 1 CAPSULE BY MOUTH EVERY DAY 90 capsule 1  . simvastatin (ZOCOR) 40 MG tablet TAKE 1/2 TABLET BY MOUTH EVERY NIGHT AT BEDTIME 45 tablet 1   No current facility-administered medications on file prior to visit.     Past Medical History:  Diagnosis Date  . Allergy   . Diabetes mellitus without complication ([place])   . Hyperlipidemia   . Hypertension     Past Surgical History:  Procedure Laterality Date  . CESAREAN SECTION     x1  . CHOLECYSTECTOMY    . TUBAL LIGATION      Social History   Socioeconomic History  . Marital status: Married    Spouse name: Not on file  .  Number of children: Not on file  . Years of education: Not on file  . Highest education level: Not on file  Occupational History  . Not on file  Social Needs  . Financial resource strain: Not on file  . Food insecurity:    Worry: Not on file    Inability: Not on file  . Transportation needs:    Medical: Not on file    Non-medical: Not on file  Tobacco Use  . Smoking status: Former Smoker    Types: Cigarettes    Last attempt to quit: 05/01/2001    Years since quitting: 16.1  . Smokeless tobacco: Never Used  . Tobacco comment: quit 12 yrs ago  Substance and Sexual Activity  . Alcohol use: Yes    Alcohol/week: 0.0 oz    Comment: socially  . Drug use: No  . Sexual activity: Yes    Partners: Male  Lifestyle  . Physical activity:    Days per week: Not on file    Minutes  per session: Not on file  . Stress: Not on file  Relationships  . Social connections:    Talks on phone: Not on file    Gets together: Not on file    Attends religious service: Not on file    Active member of club or organization: Not on file    Attends meetings of clubs or organizations: Not on file    Relationship status: Not on file  Other Topics Concern  . Not on file  Social History Narrative  . Not on file    Family History  Problem Relation Age of Onset  . COPD Mother   . Heart disease Father   . Breast cancer Sister   . Colon cancer Neg Hx   . Rectal cancer Neg Hx   . Stomach cancer Neg Hx   . Diabetes Brother     Review of Systems  Constitutional: Negative for chills and fever.  Musculoskeletal: Positive for joint swelling.  Skin: Negative for color change and rash.  Neurological: Positive for weakness (Related to pain-has dropped things) and numbness.       Objective:   Vitals:   07/05/17 0840  BP: 134/80  Pulse: 88  Resp: 16  Temp: 98.2 F (36.8 C)  SpO2: 96%   Wt Readings from Last 3 Encounters:  07/05/17 282 lb (127.9 kg)  06/28/17 279 lb (126.6 kg)  06/21/17 280 lb (127 kg)   Body mass index is 48.41 kg/m.   Physical Exam  Constitutional: She appears well-developed and well-nourished. No distress.  HENT:  Head: Normocephalic and atraumatic.  Cardiovascular: Intact distal pulses.  Musculoskeletal:  Right thenar process with possibly slight swelling in the medial aspect, tenderness with palpation, decreased sensation anterior and medial aspect of the thumb; normal range of motion.  No pain or swelling right wrist  Skin: Skin is warm and dry. She is not diaphoretic. No erythema.           Assessment & Plan:    See Problem List for Assessment and Plan of chronic medical problems.

## 2017-07-05 ENCOUNTER — Ambulatory Visit: Payer: 59 | Admitting: Internal Medicine

## 2017-07-05 ENCOUNTER — Encounter: Payer: Self-pay | Admitting: Internal Medicine

## 2017-07-05 VITALS — BP 134/80 | HR 88 | Temp 98.2°F | Resp 16 | Wt 282.0 lb

## 2017-07-05 DIAGNOSIS — M79644 Pain in right finger(s): Secondary | ICD-10-CM

## 2017-07-05 NOTE — Assessment & Plan Note (Signed)
Pain in the thenar process of right thumb for 10 days-has improved, but still significant enough that she is requiring Advil.  Associated with numbness in the thumb No injury or overuse activity Will refer to hand surgery for further evaluation Continue to use thumb brace Ice Increase Tylenol use Try to keep Advil to a minimum

## 2017-07-05 NOTE — Patient Instructions (Signed)
  Continue to take advil - but do not take it long term.  Ice the area.  Use the thumb splint.    A referral was ordered for a hand orthopedic.

## 2017-07-06 ENCOUNTER — Other Ambulatory Visit: Payer: Self-pay | Admitting: Internal Medicine

## 2017-07-07 ENCOUNTER — Encounter: Payer: Self-pay | Admitting: Internal Medicine

## 2017-07-11 DIAGNOSIS — M1811 Unilateral primary osteoarthritis of first carpometacarpal joint, right hand: Secondary | ICD-10-CM | POA: Diagnosis not present

## 2017-07-25 DIAGNOSIS — G4733 Obstructive sleep apnea (adult) (pediatric): Secondary | ICD-10-CM | POA: Diagnosis not present

## 2017-07-28 DIAGNOSIS — G4733 Obstructive sleep apnea (adult) (pediatric): Secondary | ICD-10-CM | POA: Diagnosis not present

## 2017-08-08 ENCOUNTER — Other Ambulatory Visit: Payer: Self-pay | Admitting: Internal Medicine

## 2017-08-19 DIAGNOSIS — G5601 Carpal tunnel syndrome, right upper limb: Secondary | ICD-10-CM | POA: Diagnosis not present

## 2017-08-19 DIAGNOSIS — M1811 Unilateral primary osteoarthritis of first carpometacarpal joint, right hand: Secondary | ICD-10-CM | POA: Diagnosis not present

## 2017-08-26 ENCOUNTER — Encounter: Payer: Self-pay | Admitting: Internal Medicine

## 2017-08-27 DIAGNOSIS — G4733 Obstructive sleep apnea (adult) (pediatric): Secondary | ICD-10-CM | POA: Diagnosis not present

## 2017-09-27 DIAGNOSIS — G4733 Obstructive sleep apnea (adult) (pediatric): Secondary | ICD-10-CM | POA: Diagnosis not present

## 2017-10-03 ENCOUNTER — Ambulatory Visit: Payer: 59 | Admitting: Adult Health

## 2017-10-03 ENCOUNTER — Encounter: Payer: Self-pay | Admitting: Adult Health

## 2017-10-03 DIAGNOSIS — G4733 Obstructive sleep apnea (adult) (pediatric): Secondary | ICD-10-CM | POA: Diagnosis not present

## 2017-10-03 NOTE — Progress Notes (Signed)
@Patient  ID: Danielle Chase, female    DOB: 10/09/1960, 57 y.o.   MRN: [id number]  Chief Complaint  Patient presents with  . Follow-up    OSA    Referring provider: Binnie Rail, MD  HPI: 57 year old female followed for severe sleep apnea.  Significant tests/ events reviewed  HST >> severe OSA 48/h  .10/03/2017 Follow up : OSA  Patient presents for a follow-up for sleep apnea.  Patient is on CPAP at bedtime.  Patient says she is doing okay on CPAP.  She is trying to  get used to her mask.  She feels it does help her with her daytime sleepiness. Alert shows good compliance with average usage around 5 hours.  Patient is on auto CPAP 5 to 15 cm H2O.  AHI 1.4.  Minimum leaks.    No Known Allergies  Immunization History  Administered Date(s) Administered  . Influenza Split 01/05/2012  . Influenza,inj,Quad PF,6+ Mos 12/26/2012, 01/15/2014, 12/23/2015, 12/22/2016  . Influenza-Unspecified 01/11/2015  . Tdap 06/27/2007    Past Medical History:  Diagnosis Date  . Allergy   . Diabetes mellitus without complication ([place])   . Hyperlipidemia   . Hypertension     Tobacco History: Social History   Tobacco Use  Smoking Status Former Smoker  . Types: Cigarettes  . Last attempt to quit: 05/01/2001  . Years since quitting: 16.4  Smokeless Tobacco Never Used  Tobacco Comment   quit 12 yrs ago   Counseling given: Not Answered Comment: quit 12 yrs ago   Outpatient Encounter Medications as of 10/03/2017  Medication Sig  . aspirin 81 MG tablet Take 81 mg by mouth daily.  . ciclopirox (PENLAC) 8 % solution Apply topically at bedtime. Apply over nail & surrounding skin. Apply QD over previous coat. After 7 days, may remove w/ alcohol & continue  . DULoxetine (CYMBALTA) 20 MG capsule TAKE 1 CAPSULE(20 MG) BY MOUTH DAILY  . fluticasone (FLONASE) 50 MCG/ACT nasal spray INSTILL 2 SPRAYS IN EACH NOSTRIL DAILY PRN  . hydrochlorothiazide (HYDRODIURIL) 25 MG tablet TAKE 1 TABLET(25  MG) BY MOUTH DAILY  . losartan (COZAAR) 50 MG tablet TAKE 1 TABLET(50 MG) BY MOUTH DAILY  . metFORMIN (GLUCOPHAGE-XR) 500 MG 24 hr tablet TAKE 1 TABLET(500 MG) BY MOUTH DAILY WITH BREAKFAST  . Multiple Vitamins tablet TAKE 1 TABLET BY MOUTH DAILY  . propranolol ER (INDERAL LA) 60 MG 24 hr capsule TAKE 1 CAPSULE BY MOUTH EVERY DAY  . simvastatin (ZOCOR) 40 MG tablet TAKE 1/2 TABLET BY MOUTH EVERY NIGHT AT BEDTIME   No facility-administered encounter medications on file as of 10/03/2017.      Review of Systems  Constitutional:   No  weight loss, night sweats,  Fevers, chills, fatigue, or  lassitude.  HEENT:   No headaches,  Difficulty swallowing,  Tooth/dental problems, or  Sore throat,                No sneezing, itching, ear ache, nasal congestion, post nasal drip,   CV:  No chest pain,  Orthopnea, PND, swelling in lower extremities, anasarca, dizziness, palpitations, syncope.   GI  No heartburn, indigestion, abdominal pain, nausea, vomiting, diarrhea, change in bowel habits, loss of appetite, bloody stools.   Resp: No shortness of breath with exertion or at rest.  No excess mucus, no productive cough,  No non-productive cough,  No coughing up of blood.  No change in color of mucus.  No wheezing.  No chest wall deformity  Skin: no rash or lesions.  GU: no dysuria, change in color of urine, no urgency or frequency.  No flank pain, no hematuria   MS:  No joint pain or swelling.  No decreased range of motion.  + back pain.    Physical Exam  BP 126/64 (BP Location: Left Arm, Cuff Size: Normal)   Pulse 60   Ht 5\' 4"  (1.626 m)   Wt 280 lb (127 kg)   SpO2 96%   BMI 48.06 kg/m   GEN: A/Ox3; pleasant , NAD, obese    HEENT:  [place]/AT,  EACs-clear, TMs-wnl, NOSE-clear, THROAT-clear, no lesions, no postnasal drip or exudate noted. Class 3 MP airway   NECK:  Supple w/ fair ROM; no JVD; normal carotid impulses w/o bruits; no thyromegaly or nodules palpated; no lymphadenopathy.    RESP   Clear  P & A; w/o, wheezes/ rales/ or rhonchi. no accessory muscle use, no dullness to percussion  CARD:  RRR, no m/r/g, no peripheral edema, pulses intact, no cyanosis or clubbing.  GI:   Soft & nt; nml bowel sounds; no organomegaly or masses detected.   Musco: Warm bil, no deformities or joint swelling noted.   Neuro: alert, no focal deficits noted.    Skin: Warm, no lesions or rashes    Lab Results:  CBC  BNP No results found for: BNP  ProBNP No results found for: PROBNP  Imaging: No results found.   Assessment & Plan:   OSA (obstructive sleep apnea) Well-controlled on CPAP  Plan  Patient Instructions  Keep up good work . Continue on CPAP At bedtime  .  Work on healthy weight  Do not drive if sleepy  Follow up Dr. Elsworth Soho  In 1 year and As needed        Morbid obesity ([place]) Wt loss      Rexene Edison, NP 10/03/2017

## 2017-10-03 NOTE — Assessment & Plan Note (Signed)
Wt loss  

## 2017-10-03 NOTE — Assessment & Plan Note (Signed)
Well-controlled on CPAP  Plan  Patient Instructions  Keep up good work . Continue on CPAP At bedtime  .  Work on healthy weight  Do not drive if sleepy  Follow up Dr. Elsworth Soho  In 1 year and As needed

## 2017-10-03 NOTE — Patient Instructions (Signed)
Keep up good work . Continue on CPAP At bedtime  .  Work on healthy weight  Do not drive if sleepy  Follow up Dr. Elsworth Soho  In 1 year and As needed

## 2017-10-27 DIAGNOSIS — G4733 Obstructive sleep apnea (adult) (pediatric): Secondary | ICD-10-CM | POA: Diagnosis not present

## 2017-11-11 DIAGNOSIS — G4733 Obstructive sleep apnea (adult) (pediatric): Secondary | ICD-10-CM | POA: Diagnosis not present

## 2017-11-26 ENCOUNTER — Other Ambulatory Visit: Payer: Self-pay | Admitting: Internal Medicine

## 2017-11-27 DIAGNOSIS — G4733 Obstructive sleep apnea (adult) (pediatric): Secondary | ICD-10-CM | POA: Diagnosis not present

## 2017-11-28 ENCOUNTER — Other Ambulatory Visit: Payer: Self-pay | Admitting: Internal Medicine

## 2017-12-21 NOTE — Patient Instructions (Addendum)
  Test(s) ordered today. Your results will be released to [place] (or called to you) after review, usually within 72hours after test completion. If any changes need to be made, you will be notified at that same time.  All other Health Maintenance issues reviewed.   All recommended immunizations and age-appropriate screenings are up-to-date or discussed.  Tetanus and pneumonia immunizations administered today.   Medications reviewed and updated.  No changes recommended at this time.    Please followup in 6 months

## 2017-12-21 NOTE — Progress Notes (Signed)
Subjective:    Patient ID: Danielle Chase, female    DOB: 15-Sep-1960, 57 y.o.   MRN: [id number]  HPI The patient is here for follow up.  Hypertension: She is taking her medication daily. She is compliant with a low sodium diet.  She denies chest pain, edema, shortness of breath and regular headaches. She is not exercising regularly.  She does not monitor her blood pressure at home.    Diabetes: She is taking her medication daily as prescribed. She is not compliant with a low carb diet, but is good at avoiding the sugars. She is not exercising regularly.  She checks her feet daily and denies foot lesions. She is up-to-date with an ophthalmology examination.   Hyperlipidemia: She is taking her medication daily. She is compliant with a low fat/cholesterol diet. She is not exercising regularly. She denies myalgias.   Depression, anxiety: She is taking her medication daily as prescribed. She denies any side effects from the medication. She feels her depression and anxiety are well controlled and she is happy with her current dose of medication.   OSA: she uses the cpap nightly  She sleeps better since starting the cpap.   Medications and allergies reviewed with patient and updated if appropriate.  Patient Active Problem List   Diagnosis Date Noted  . Pain of right thumb 07/05/2017  . Onychomycosis 12/22/2016  . OSA (obstructive sleep apnea) 12/22/2016  . 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 Anxiety and depression 12/23/2015  . Morbid obesity ([place]) 09/26/2015  . 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 Carpal tunnel syndrome 06/05/2013  . Diabetes mellitus ([place]) 08/03/2011  . Hypertension 08/03/2011  . Hyperlipidemia 08/03/2011    Current Outpatient Medications on File Prior to Visit  Medication Sig Dispense Refill  . aspirin 81 MG tablet Take 81 mg by mouth daily.    . ciclopirox (PENLAC) 8 % solution Apply topically at bedtime. Apply over nail & surrounding skin. Apply QD  over previous coat. After 7 days, may remove w/ alcohol & continue 6.6 mL 2  . DULoxetine (CYMBALTA) 20 MG capsule TAKE 1 CAPSULE(20 MG) BY MOUTH DAILY 90 capsule 0  . fluticasone (FLONASE) 50 MCG/ACT nasal spray INSTILL 2 SPRAYS IN EACH NOSTRIL DAILY PRN 48 g 1  . hydrochlorothiazide (HYDRODIURIL) 25 MG tablet TAKE 1 TABLET(25 MG) BY MOUTH DAILY 90 tablet 0  . losartan (COZAAR) 50 MG tablet TAKE 1 TABLET(50 MG) BY MOUTH DAILY 90 tablet 1  . metFORMIN (GLUCOPHAGE-XR) 500 MG 24 hr tablet TAKE 1 TABLET(500 MG) BY MOUTH DAILY WITH BREAKFAST 90 tablet 0  . Multiple Vitamins tablet TAKE 1 TABLET BY MOUTH DAILY 30 tablet 5  . propranolol ER (INDERAL LA) 60 MG 24 hr capsule TAKE 1 CAPSULE BY MOUTH EVERY DAY 90 capsule 0  . simvastatin (ZOCOR) 40 MG tablet TAKE 1/2 TABLET BY MOUTH EVERY NIGHT AT BEDTIME 45 tablet 1   No current facility-administered medications on file prior to visit.     Past Medical History:  Diagnosis Date  . Allergy   . Diabetes mellitus without complication ([place])   . Hyperlipidemia   . Hypertension     Past Surgical History:  Procedure Laterality Date  . CESAREAN SECTION     x1  . CHOLECYSTECTOMY    . TUBAL LIGATION      Social History   Socioeconomic History  . Marital status: Married    Spouse name: Not on file  . Number of children: Not on  file  . Years of education: Not on file  . Highest education level: Not on file  Occupational History  . Not on file  Social Needs  . Financial resource strain: Not on file  . Food insecurity:    Worry: Not on file    Inability: Not on file  . Transportation needs:    Medical: Not on file    Non-medical: Not on file  Tobacco Use  . Smoking status: Former Smoker    Types: Cigarettes    Last attempt to quit: 05/01/2001    Years since quitting: 16.6  . Smokeless tobacco: Never Used  . Tobacco comment: quit 12 yrs ago  Substance and Sexual Activity  . Alcohol use: Yes    Alcohol/week: 0.0 standard drinks     Comment: socially  . Drug use: No  . Sexual activity: Yes    Partners: Male  Lifestyle  . Physical activity:    Days per week: Not on file    Minutes per session: Not on file  . Stress: Not on file  Relationships  . Social connections:    Talks on phone: Not on file    Gets together: Not on file    Attends religious service: Not on file    Active member of club or organization: Not on file    Attends meetings of clubs or organizations: Not on file    Relationship status: Not on file  Other Topics Concern  . Not on file  Social History Narrative  . Not on file    Family History  Problem Relation Age of Onset  . COPD Mother   . Heart disease Father   . Breast cancer Sister   . Colon cancer Neg Hx   . Rectal cancer Neg Hx   . Stomach cancer Neg Hx   . Diabetes Brother     Review of Systems  Constitutional: Negative for chills and fever.  Respiratory: Negative for shortness of breath and wheezing.   Cardiovascular: Positive for palpitations (occasional). Negative for chest pain and leg swelling.  Musculoskeletal: Negative for myalgias.  Neurological: Negative for light-headedness and headaches.       Objective:   Vitals:   12/22/17 0840  BP: 120/64  Pulse: 62  Resp: 16  Temp: 98.3 F (36.8 C)  SpO2: 97%   BP Readings from Last 3 Encounters:  12/22/17 120/64  10/03/17 126/64  07/05/17 134/80   Wt Readings from Last 3 Encounters:  12/22/17 281 lb 6.4 oz (127.6 kg)  10/03/17 280 lb (127 kg)  07/05/17 282 lb (127.9 kg)   Body mass index is 48.3 kg/m.   Physical Exam    Constitutional: Appears well-developed and well-nourished. No distress.  HENT:  Head: Normocephalic and atraumatic.  Neck: Neck supple. No tracheal deviation present. No thyromegaly present.  No cervical lymphadenopathy Cardiovascular: Normal rate, regular rhythm and normal heart sounds.   No murmur heard. No carotid bruit .  Trace pitting b/l LE edema Pulmonary/Chest: Effort normal  and breath sounds normal. No respiratory distress. No has no wheezes. No rales.  Skin: Skin is warm and dry. Not diaphoretic.  Psychiatric: Normal mood and affect. Behavior is normal.      Assessment & Plan:    See Problem List for Assessment and Plan of chronic medical problems.

## 2017-12-22 ENCOUNTER — Other Ambulatory Visit (INDEPENDENT_AMBULATORY_CARE_PROVIDER_SITE_OTHER): Payer: 59

## 2017-12-22 ENCOUNTER — Encounter: Payer: Self-pay | Admitting: Internal Medicine

## 2017-12-22 ENCOUNTER — Ambulatory Visit: Payer: 59 | Admitting: Internal Medicine

## 2017-12-22 VITALS — BP 120/64 | HR 62 | Temp 98.3°F | Resp 16 | Ht 64.0 in | Wt 281.4 lb

## 2017-12-22 DIAGNOSIS — E118 Type 2 diabetes mellitus with unspecified complications: Secondary | ICD-10-CM | POA: Diagnosis not present

## 2017-12-22 DIAGNOSIS — E782 Mixed hyperlipidemia: Secondary | ICD-10-CM

## 2017-12-22 DIAGNOSIS — I10 Essential (primary) hypertension: Secondary | ICD-10-CM

## 2017-12-22 DIAGNOSIS — F32A Depression, unspecified: Secondary | ICD-10-CM

## 2017-12-22 DIAGNOSIS — Z23 Encounter for immunization: Secondary | ICD-10-CM

## 2017-12-22 DIAGNOSIS — F329 Major depressive disorder, single episode, unspecified: Secondary | ICD-10-CM

## 2017-12-22 DIAGNOSIS — F419 Anxiety disorder, unspecified: Secondary | ICD-10-CM

## 2017-12-22 LAB — COMPREHENSIVE METABOLIC PANEL
ALT: 23 U/L (ref 0–35)
AST: 16 U/L (ref 0–37)
Albumin: 3.9 g/dL (ref 3.5–5.2)
Alkaline Phosphatase: 52 U/L (ref 39–117)
BUN: 17 mg/dL (ref 6–23)
CALCIUM: 9.1 mg/dL (ref 8.4–10.5)
CHLORIDE: 100 meq/L (ref 96–112)
CO2: 32 meq/L (ref 19–32)
CREATININE: 1.13 mg/dL (ref 0.40–1.20)
GFR: 52.59 mL/min — AB (ref >60.00)
Glucose, Bld: 204 mg/dL — ABNORMAL HIGH (ref 70–99)
POTASSIUM: 4.3 meq/L (ref 3.5–5.1)
Sodium: 139 mEq/L (ref 135–145)
Total Bilirubin: 1 mg/dL (ref 0.2–1.2)
Total Protein: 7.2 g/dL (ref 6.0–8.3)

## 2017-12-22 LAB — HEMOGLOBIN A1C: Hgb A1c MFr Bld: 8.1 % — ABNORMAL HIGH (ref 4.6–6.5)

## 2017-12-22 LAB — LIPID PANEL
CHOL/HDL RATIO: 3
Cholesterol: 117 mg/dL (ref 0–200)
HDL: 38.1 mg/dL — AB (ref >39.00)
LDL CALC: 59 mg/dL (ref 0–99)
NONHDL: 78.8
TRIGLYCERIDES: 101 mg/dL (ref 0.0–149.0)
VLDL: 20.2 mg/dL (ref 0.0–40.0)

## 2017-12-22 LAB — CBC WITH DIFFERENTIAL/PLATELET
BASOS ABS: 0.1 10*3/uL (ref 0.0–0.1)
Basophils Relative: 1.2 % (ref 0.0–3.0)
EOS ABS: 0.2 10*3/uL (ref 0.0–0.7)
Eosinophils Relative: 3.6 % (ref 0.0–5.0)
HEMATOCRIT: 43 % (ref 36.0–46.0)
HEMOGLOBIN: 15.3 g/dL — AB (ref 12.0–15.0)
LYMPHS PCT: 24 % (ref 12.0–46.0)
Lymphs Abs: 1.5 10*3/uL (ref 0.7–4.0)
MCHC: 35.5 g/dL (ref 30.0–36.0)
MCV: 93.1 fl (ref 78.0–100.0)
MONOS PCT: 14.5 % — AB (ref 3.0–12.0)
Monocytes Absolute: 0.9 10*3/uL (ref 0.1–1.0)
NEUTROS ABS: 3.5 10*3/uL (ref 1.4–7.7)
Neutrophils Relative %: 56.7 % (ref 43.0–77.0)
PLATELETS: 200 10*3/uL (ref 150.0–400.0)
RBC: 4.62 Mil/uL (ref 3.87–5.11)
RDW: 12.6 % (ref 11.5–15.5)
WBC: 6.2 10*3/uL (ref 4.0–10.5)

## 2017-12-22 NOTE — Assessment & Plan Note (Signed)
BP well controlled Current regimen effective and well tolerated Continue current medications at current doses cmp  

## 2017-12-22 NOTE — Assessment & Plan Note (Signed)
Check a1c Low sugar / carb diet Stressed regular exercise, weight loss  FU in 6 months 

## 2017-12-22 NOTE — Assessment & Plan Note (Signed)
Check lipid panel  Continue daily statin Regular exercise and healthy diet encouraged  

## 2017-12-22 NOTE — Assessment & Plan Note (Signed)
Only takes cymbalta about 2/week - not daily - she feels this works  She feels both her anxiety and depression are adequately controlled, stable Continue current dose of medication

## 2017-12-23 MED ORDER — METFORMIN HCL ER 500 MG PO TB24: ORAL_TABLET | ORAL | 0 refills | Status: DC

## 2017-12-28 DIAGNOSIS — G4733 Obstructive sleep apnea (adult) (pediatric): Secondary | ICD-10-CM | POA: Diagnosis not present

## 2018-01-27 DIAGNOSIS — G4733 Obstructive sleep apnea (adult) (pediatric): Secondary | ICD-10-CM | POA: Diagnosis not present

## 2018-01-31 ENCOUNTER — Other Ambulatory Visit: Payer: Self-pay | Admitting: Internal Medicine

## 2018-02-28 DIAGNOSIS — Z124 Encounter for screening for malignant neoplasm of cervix: Secondary | ICD-10-CM | POA: Diagnosis not present

## 2018-02-28 DIAGNOSIS — Z6841 Body Mass Index (BMI) 40.0 and over, adult: Secondary | ICD-10-CM | POA: Diagnosis not present

## 2018-02-28 DIAGNOSIS — Z1231 Encounter for screening mammogram for malignant neoplasm of breast: Secondary | ICD-10-CM | POA: Diagnosis not present

## 2018-02-28 DIAGNOSIS — Z01419 Encounter for gynecological examination (general) (routine) without abnormal findings: Secondary | ICD-10-CM | POA: Diagnosis not present

## 2018-02-28 LAB — HM MAMMOGRAPHY

## 2018-03-01 DIAGNOSIS — Z124 Encounter for screening for malignant neoplasm of cervix: Secondary | ICD-10-CM | POA: Diagnosis not present

## 2018-03-01 LAB — HM PAP SMEAR: HM PAP: NEGATIVE

## 2018-03-08 ENCOUNTER — Encounter: Payer: Self-pay | Admitting: Internal Medicine

## 2018-03-20 ENCOUNTER — Ambulatory Visit: Payer: 59 | Admitting: Internal Medicine

## 2018-03-20 ENCOUNTER — Other Ambulatory Visit (INDEPENDENT_AMBULATORY_CARE_PROVIDER_SITE_OTHER): Payer: 59

## 2018-03-20 ENCOUNTER — Encounter: Payer: Self-pay | Admitting: Internal Medicine

## 2018-03-20 VITALS — BP 122/62 | HR 75 | Temp 98.2°F | Resp 18 | Ht 64.0 in | Wt 276.0 lb

## 2018-03-20 DIAGNOSIS — R1032 Left lower quadrant pain: Secondary | ICD-10-CM | POA: Insufficient documentation

## 2018-03-20 LAB — URINALYSIS, ROUTINE W REFLEX MICROSCOPIC
Bilirubin Urine: NEGATIVE
Hgb urine dipstick: NEGATIVE
Ketones, ur: NEGATIVE
Nitrite: NEGATIVE
RBC / HPF: NONE SEEN (ref 0–?)
Specific Gravity, Urine: <=1.005 — AB (ref 1.000–1.030)
Total Protein, Urine: NEGATIVE
Urine Glucose: 250 — AB
Urobilinogen, UA: 0.2 (ref 0.0–1.0)
pH: 5.5 (ref 5.0–8.0)

## 2018-03-20 LAB — CBC WITH DIFFERENTIAL/PLATELET
Basophils Absolute: 0.1 10*3/uL (ref 0.0–0.1)
Basophils Relative: 0.7 % (ref 0.0–3.0)
Eosinophils Absolute: 0.3 10*3/uL (ref 0.0–0.7)
Eosinophils Relative: 2.6 % (ref 0.0–5.0)
HCT: 42.3 % (ref 36.0–46.0)
Hemoglobin: 14.9 g/dL (ref 12.0–15.0)
Lymphocytes Relative: 20.9 % (ref 12.0–46.0)
Lymphs Abs: 2 10*3/uL (ref 0.7–4.0)
MCHC: 35.3 g/dL (ref 30.0–36.0)
MCV: 93.2 fl (ref 78.0–100.0)
Monocytes Absolute: 1.4 10*3/uL — ABNORMAL HIGH (ref 0.1–1.0)
Monocytes Relative: 14.3 % — ABNORMAL HIGH (ref 3.0–12.0)
Neutro Abs: 5.9 10*3/uL (ref 1.4–7.7)
Neutrophils Relative %: 61.5 % (ref 43.0–77.0)
Platelets: 227 10*3/uL (ref 150.0–400.0)
RBC: 4.54 Mil/uL (ref 3.87–5.11)
RDW: 12.8 % (ref 11.5–15.5)
WBC: 9.6 10*3/uL (ref 4.0–10.5)

## 2018-03-20 LAB — COMPREHENSIVE METABOLIC PANEL
ALK PHOS: 58 U/L (ref 39–117)
ALT: 24 U/L (ref 0–35)
AST: 21 U/L (ref 0–37)
Albumin: 4.2 g/dL (ref 3.5–5.2)
BUN: 15 mg/dL (ref 6–23)
CO2: 29 mEq/L (ref 19–32)
Calcium: 9.6 mg/dL (ref 8.4–10.5)
Chloride: 101 mEq/L (ref 96–112)
Creatinine, Ser: 0.98 mg/dL (ref 0.40–1.20)
GFR: 61.94 mL/min (ref >60.00)
Glucose, Bld: 161 mg/dL — ABNORMAL HIGH (ref 70–99)
Potassium: 3.7 mEq/L (ref 3.5–5.1)
Sodium: 138 mEq/L (ref 135–145)
Total Bilirubin: 0.9 mg/dL (ref 0.2–1.2)
Total Protein: 7.4 g/dL (ref 6.0–8.3)

## 2018-03-20 MED ORDER — AMOXICILLIN-POT CLAVULANATE 875-125 MG PO TABS: 1.0000 | ORAL_TABLET | Freq: Two times a day (BID) | ORAL | 0 refills | Status: DC

## 2018-03-20 NOTE — Progress Notes (Signed)
Subjective:    Patient ID: Danielle Chase, female    DOB: Feb 07, 1961, 57 y.o.   MRN: [id number]  HPI The patient is here for an acute visit.   Yesterday she started having a wave of pain across her lower abdomen.  It was intermittent.  Today the pain has been more focused in the LLQ and more constant.   She has not been eating well - she is in the process of moving. She had diverticulitis in 2015 and these symptoms are the same as what she had then.    She has had a little nausea.  She denies changes in bowels or blood in the stool.  She denies fever, chills, dysuria and urinary frequency.    Medications and allergies reviewed with patient and updated if appropriate.  Patient Active Problem List   Diagnosis Date Noted  . Pain of right thumb 07/05/2017  . Onychomycosis 12/22/2016  . OSA (obstructive sleep apnea) 12/22/2016  . 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 Anxiety and depression 12/23/2015  . Morbid obesity ([place]) 09/26/2015  . 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 Carpal tunnel syndrome 06/05/2013  . Diabetes mellitus ([place]) 08/03/2011  . Hypertension 08/03/2011  . Hyperlipidemia 08/03/2011    Current Outpatient Medications on File Prior to Visit  Medication Sig Dispense Refill  . aspirin 81 MG tablet Take 81 mg by mouth daily.    . DULoxetine (CYMBALTA) 20 MG capsule TAKE 1 CAPSULE(20 MG) BY MOUTH DAILY 90 capsule 0  . fluticasone (FLONASE) 50 MCG/ACT nasal spray INSTILL 2 SPRAYS IN EACH NOSTRIL DAILY PRN 48 g 1  . hydrochlorothiazide (HYDRODIURIL) 25 MG tablet TAKE 1 TABLET(25 MG) BY MOUTH DAILY 90 tablet 0  . losartan (COZAAR) 50 MG tablet TAKE 1 TABLET(50 MG) BY MOUTH DAILY 90 tablet 1  . metFORMIN (GLUCOPHAGE-XR) 500 MG 24 hr tablet TAKE 3 TABLET(1500 MG) BY MOUTH DAILY WITH BREAKFAST 270 tablet 0  . Multiple Vitamins tablet TAKE 1 TABLET BY MOUTH DAILY 30 tablet 5  . propranolol ER (INDERAL LA) 60 MG 24 hr capsule TAKE 1 CAPSULE BY MOUTH EVERY DAY 90  capsule 0  . simvastatin (ZOCOR) 40 MG tablet TAKE 1/2 TABLET BY MOUTH EVERY NIGHT AT BEDTIME 45 tablet 1   No current facility-administered medications on file prior to visit.     Past Medical History:  Diagnosis Date  . Allergy   . Diabetes mellitus without complication ([place])   . Hyperlipidemia   . Hypertension     Past Surgical History:  Procedure Laterality Date  . CESAREAN SECTION     x1  . CHOLECYSTECTOMY    . TUBAL LIGATION      Social History   Socioeconomic History  . Marital status: Married    Spouse name: Not on file  . Number of children: Not on file  . Years of education: Not on file  . Highest education level: Not on file  Occupational History  . Not on file  Social Needs  . Financial resource strain: Not on file  . Food insecurity:    Worry: Not on file    Inability: Not on file  . Transportation needs:    Medical: Not on file    Non-medical: Not on file  Tobacco Use  . Smoking status: Former Smoker    Types: Cigarettes    Last attempt to quit: 05/01/2001    Years since quitting: 16.8  . Smokeless tobacco: Never Used  . Tobacco comment: quit  12 yrs ago  Substance and Sexual Activity  . Alcohol use: Yes    Alcohol/week: 0.0 standard drinks    Comment: socially  . Drug use: No  . Sexual activity: Yes    Partners: Male  Lifestyle  . Physical activity:    Days per week: Not on file    Minutes per session: Not on file  . Stress: Not on file  Relationships  . Social connections:    Talks on phone: Not on file    Gets together: Not on file    Attends religious service: Not on file    Active member of club or organization: Not on file    Attends meetings of clubs or organizations: Not on file    Relationship status: Not on file  Other Topics Concern  . Not on file  Social History Narrative  . Not on file    Family History  Problem Relation Age of Onset  . COPD Mother   . Heart disease Father   . Breast cancer Sister   . Colon cancer  Neg Hx   . Rectal cancer Neg Hx   . Stomach cancer Neg Hx   . Diabetes Brother     Review of Systems  Constitutional: Negative for chills and fever.  Gastrointestinal: Positive for abdominal pain and nausea (mild, intermittent). Negative for blood in stool, constipation and diarrhea.       No gerd  Genitourinary: Negative for dysuria, frequency and hematuria.  Neurological: Positive for light-headedness and headaches.       Objective:   Vitals:   03/20/18 1528  BP: 122/62  Pulse: 75  Resp: 18  Temp: 98.2 F (36.8 C)  SpO2: 97%   BP Readings from Last 3 Encounters:  03/20/18 122/62  12/22/17 120/64  10/03/17 126/64   Wt Readings from Last 3 Encounters:  03/20/18 276 lb (125.2 kg)  12/22/17 281 lb 6.4 oz (127.6 kg)  10/03/17 280 lb (127 kg)   Body mass index is 47.38 kg/m.   Physical Exam  Constitutional: She appears well-developed and well-nourished.  Non-toxic appearance. She does not appear ill. No distress.  HENT:  Head: Normocephalic and atraumatic.  Cardiovascular: Normal rate and regular rhythm.  Pulmonary/Chest: Effort normal and breath sounds normal.  Abdominal: Soft. Normal appearance and bowel sounds are normal. There is tenderness (LLQ > across lower abdomen). There is no rebound, no guarding and no CVA tenderness.  Skin: Skin is warm and dry.           Assessment & Plan:    See Problem List for Assessment and Plan of chronic medical problems.

## 2018-03-20 NOTE — Assessment & Plan Note (Signed)
Symptoms consistent with prior diverticulitis  - less likely UTI Check cbc, cmp, UA, UCx We will not be able to get a CT scan done today, so we will start empiric treatment for probable diveritulitis Will image if symptoms do not improve Advised low fiber diet until she is done with antibiotics then increase fiber

## 2018-03-20 NOTE — Patient Instructions (Addendum)
Have blood work and urine tests done today.    Take the augmentin as prescribed - complete the entire course.  Eat a low fiber diet  Call if no improvement     Diverticulitis Diverticulitis is infection or inflammation of small pouches (diverticula) in the colon that form due to a condition called diverticulosis. Diverticula can trap stool (feces) and bacteria, causing infection and inflammation. Diverticulitis may cause severe stomach pain and diarrhea. It may lead to tissue damage in the colon that causes bleeding. The diverticula may also burst (rupture) and cause infected stool to enter other areas of the abdomen. Complications of diverticulitis can include:  Bleeding.  Severe infection.  Severe pain.  Rupture (perforation) of the colon.  Blockage (obstruction) of the colon.  What are the causes? This condition is caused by stool becoming trapped in the diverticula, which allows bacteria to grow in the diverticula. This leads to inflammation and infection. What increases the risk? You are more likely to develop this condition if:  You have diverticulosis. The risk for diverticulosis increases if: ? You are overweight or obese. ? You use tobacco products. ? You do not get enough exercise.  You eat a diet that does not include enough fiber. High-fiber foods include fruits, vegetables, beans, nuts, and whole grains.  What are the signs or symptoms? Symptoms of this condition may include:  Pain and tenderness in the abdomen. The pain is normally located on the left side of the abdomen, but it may occur in other areas.  Fever and chills.  Bloating.  Cramping.  Nausea.  Vomiting.  Changes in bowel routines.  Blood in your stool.  How is this diagnosed? This condition is diagnosed based on:  Your medical history.  A physical exam.  Tests to make sure there is nothing else causing your condition. These tests may include: ? Blood tests. ? Urine  tests. ? Imaging tests of the abdomen, including X-rays, ultrasounds, MRIs, or CT scans.  How is this treated? Most cases of this condition are mild and can be treated at home. Treatment may include:  Taking over-the-counter pain medicines.  Following a clear liquid diet.  Taking antibiotic medicines by mouth.  Rest.  More severe cases may need to be treated at a hospital. Treatment may include:  Not eating or drinking.  Taking prescription pain medicine.  Receiving antibiotic medicines through an IV tube.  Receiving fluids and nutrition through an IV tube.  Surgery.  When your condition is under control, your health care provider may recommend that you have a colonoscopy. This is an exam to look at the entire large intestine. During the exam, a lubricated, bendable tube is inserted into the anus and then passed into the rectum, colon, and other parts of the large intestine. A colonoscopy can show how severe your diverticula are and whether something else may be causing your symptoms. Follow these instructions at home: Medicines  Take over-the-counter and prescription medicines only as told by your health care provider. These include fiber supplements, probiotics, and stool softeners.  If you were prescribed an antibiotic medicine, take it as told by your health care provider. Do not stop taking the antibiotic even if you start to feel better.  Do not drive or use heavy machinery while taking prescription pain medicine. General instructions  Follow a full liquid diet or another diet as directed by your health care provider. After your symptoms improve, your health care provider may tell you to change your diet. He  or she may recommend that you eat a diet that contains at least 25 g (25 grams) of fiber daily. Fiber makes it easier to pass stool. Healthy sources of fiber include: ? Berries. One cup contains 4-8 grams of fiber. ? Beans or lentils. One half cup contains 5-8 grams  of fiber. ? Green vegetables. One cup contains 4 grams of fiber.  Exercise for at least 30 minutes, 3 times each week. You should exercise hard enough to raise your heart rate and break a sweat.  Keep all follow-up visits as told by your health care provider. This is important. You may need a colonoscopy. Contact a health care provider if:  Your pain does not improve.  You have a hard time drinking or eating food.  Your bowel movements do not return to normal. Get help right away if:  Your pain gets worse.  Your symptoms do not get better with treatment.  Your symptoms suddenly get worse.  You have a fever.  You vomit more than one time.  You have stools that are bloody, black, or tarry. Summary  Diverticulitis is infection or inflammation of small pouches (diverticula) in the colon that form due to a condition called diverticulosis. Diverticula can trap stool (feces) and bacteria, causing infection and inflammation.  You are at higher risk for this condition if you have diverticulosis and you eat a diet that does not include enough fiber.  Most cases of this condition are mild and can be treated at home. More severe cases may need to be treated at a hospital.  When your condition is under control, your health care provider may recommend that you have an exam called a colonoscopy. This exam can show how severe your diverticula are and whether something else may be causing your symptoms. This information is not intended to replace advice given to you by your health care provider. Make sure you discuss any questions you have with your health care provider. Document Released: 01/06/2005 Document Revised: 05/01/2016 Document Reviewed: 05/01/2016 Elsevier Interactive Patient Education  [employer].

## 2018-03-21 LAB — URINE CULTURE
MICRO NUMBER:: 91470891
SPECIMEN QUALITY:: ADEQUATE

## 2018-03-24 ENCOUNTER — Other Ambulatory Visit: Payer: Self-pay | Admitting: Internal Medicine

## 2018-04-07 ENCOUNTER — Other Ambulatory Visit: Payer: Self-pay | Admitting: Internal Medicine

## 2018-05-08 DIAGNOSIS — E119 Type 2 diabetes mellitus without complications: Secondary | ICD-10-CM | POA: Diagnosis not present

## 2018-05-14 DIAGNOSIS — Z0279 Encounter for issue of other medical certificate: Secondary | ICD-10-CM

## 2018-05-17 ENCOUNTER — Ambulatory Visit: Payer: 59 | Admitting: Internal Medicine

## 2018-05-17 ENCOUNTER — Encounter: Payer: Self-pay | Admitting: Internal Medicine

## 2018-05-17 ENCOUNTER — Ambulatory Visit (INDEPENDENT_AMBULATORY_CARE_PROVIDER_SITE_OTHER)
Admission: RE | Admit: 2018-05-17 | Discharge: 2018-05-17 | Disposition: A | Discharge status: Home / Self Care | Payer: 59 | Source: Ambulatory Visit | Attending: Internal Medicine | Admitting: Internal Medicine

## 2018-05-17 ENCOUNTER — Other Ambulatory Visit: Payer: Self-pay | Admitting: Internal Medicine

## 2018-05-17 VITALS — BP 120/84 | HR 83 | Temp 99.6°F | Resp 16 | Ht 64.0 in | Wt 267.0 lb

## 2018-05-17 DIAGNOSIS — J209 Acute bronchitis, unspecified: Secondary | ICD-10-CM | POA: Diagnosis not present

## 2018-05-17 DIAGNOSIS — R05 Cough: Secondary | ICD-10-CM | POA: Diagnosis not present

## 2018-05-17 DIAGNOSIS — R062 Wheezing: Secondary | ICD-10-CM | POA: Diagnosis not present

## 2018-05-17 DIAGNOSIS — R059 Cough, unspecified: Secondary | ICD-10-CM

## 2018-05-17 MED ORDER — CEFDINIR 300 MG PO CAPS: 300.0000 mg | ORAL_CAPSULE | Freq: Two times a day (BID) | ORAL | 0 refills | Status: DC

## 2018-05-17 MED ORDER — HYDROCODONE-HOMATROPINE 5-1.5 MG/5ML PO SYRP: 5.0000 mL | ORAL_SOLUTION | Freq: Three times a day (TID) | ORAL | 0 refills | Status: DC | PRN

## 2018-05-17 NOTE — Assessment & Plan Note (Signed)
Coughing, wheezing, fever Flu negative at work Viral versus possible early bacterial bronchitis Chest x-ray Hycodan cough syrup Depending on chest x-ray results may prescribe steroids or an antibiotic Over-the-counter cold medications Increase rest and fluids Call if no improvement

## 2018-05-17 NOTE — Progress Notes (Signed)
Subjective:    Patient ID: Danielle Chase, female    DOB: [birth year], 59 y.o.   MRN: [id number]  HPI She is here for an acute visit for cold symptoms.  Her symptoms started 5 days ago  She is experiencing cough and sometimes she coughs something up.  She has spells of coughing so hard she was gagging.  She states shortness of breath with exertion and intermittent wheezing.  She states fatigue and fever at home.  Her maximum temperature was 101.6.  She has sinus pressure and headaches.  She denies nasal congestion, sore throat, diarrhea, nausea and body aches.  She was tested for flu by the nurse at work and it was negative.  She has taken dayquil, mucinex dm, tylenol and advil  Medications and allergies reviewed with patient and updated if appropriate.  Patient Active Problem List   Diagnosis Date Noted  . LLQ pain 03/20/2018  . Pain of right thumb 07/05/2017  . Onychomycosis 12/22/2016  . OSA (obstructive sleep apnea) 12/22/2016  . 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 Anxiety and depression 12/23/2015  . Morbid obesity ([place]) 09/26/2015  . 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 Carpal tunnel syndrome 06/05/2013  . Diabetes mellitus ([place]) 08/03/2011  . Hypertension 08/03/2011  . Hyperlipidemia 08/03/2011    Current Outpatient Medications on File Prior to Visit  Medication Sig Dispense Refill  . aspirin 81 MG tablet Take 81 mg by mouth daily.    . DULoxetine (CYMBALTA) 20 MG capsule TAKE 1 CAPSULE(20 MG) BY MOUTH DAILY 90 capsule 0  . fluticasone (FLONASE) 50 MCG/ACT nasal spray INSTILL 2 SPRAYS IN EACH NOSTRIL DAILY PRN 48 g 1  . hydrochlorothiazide (HYDRODIURIL) 25 MG tablet TAKE 1 TABLET(25 MG) BY MOUTH DAILY 90 tablet 1  . losartan (COZAAR) 50 MG tablet TAKE 1 TABLET(50 MG) BY MOUTH DAILY 90 tablet 1  . metFORMIN (GLUCOPHAGE-XR) 500 MG 24 hr tablet TAKE 3 TABLETS(1500 MG) BY MOUTH DAILY WITH BREAKFAST 270 tablet 0  . Multiple Vitamins tablet TAKE 1 TABLET BY MOUTH  DAILY 30 tablet 5  . propranolol ER (INDERAL LA) 60 MG 24 hr capsule TAKE 1 CAPSULE BY MOUTH EVERY DAY 90 capsule 1  . simvastatin (ZOCOR) 40 MG tablet TAKE 1/2 TABLET BY MOUTH EVERY NIGHT AT BEDTIME 45 tablet 1   No current facility-administered medications on file prior to visit.     Past Medical History:  Diagnosis Date  . Allergy   . Diabetes mellitus without complication ([place])   . Hyperlipidemia   . Hypertension     Past Surgical History:  Procedure Laterality Date  . CESAREAN SECTION     x1  . CHOLECYSTECTOMY    . TUBAL LIGATION      Social History   Socioeconomic History  . Marital status: Married    Spouse name: Not on file  . Number of children: Not on file  . Years of education: Not on file  . Highest education level: Not on file  Occupational History  . Not on file  Social Needs  . Financial resource strain: Not on file  . Food insecurity:    Worry: Not on file    Inability: Not on file  . Transportation needs:    Medical: Not on file    Non-medical: Not on file  Tobacco Use  . Smoking status: Former Smoker    Types: Cigarettes    Last attempt to quit: 05/01/2001    Years since quitting: 17.0  .  Smokeless tobacco: Never Used  . Tobacco comment: quit 12 yrs ago  Substance and Sexual Activity  . Alcohol use: Yes    Alcohol/week: 0.0 standard drinks    Comment: socially  . Drug use: No  . Sexual activity: Yes    Partners: Male  Lifestyle  . Physical activity:    Days per week: Not on file    Minutes per session: Not on file  . Stress: Not on file  Relationships  . Social connections:    Talks on phone: Not on file    Gets together: Not on file    Attends religious service: Not on file    Active member of club or organization: Not on file    Attends meetings of clubs or organizations: Not on file    Relationship status: Not on file  Other Topics Concern  . Not on file  Social History Narrative  . Not on file    Family History  Problem  Relation Age of Onset  . COPD Mother   . Heart disease Father   . Breast cancer Sister   . Colon cancer Neg Hx   . Rectal cancer Neg Hx   . Stomach cancer Neg Hx   . Diabetes Brother     Review of Systems  Constitutional: Positive for fatigue and fever (101.6). Negative for chills.  HENT: Positive for sinus pressure (left maxillary ). Negative for congestion, ear pain (mild right ear pressure), sinus pain and sore throat.   Respiratory: Positive for cough, shortness of breath (only with exertion) and wheezing (sometimes). Negative for chest tightness.   Gastrointestinal: Negative for diarrhea and nausea.  Musculoskeletal: Negative for myalgias.  Neurological: Positive for headaches.       Objective:   Vitals:   05/17/18 0925  BP: 120/84  Pulse: 83  Resp: 16  Temp: 99.6 F (37.6 C)  SpO2: 95%   Filed Weights   05/17/18 0925  Weight: 267 lb (121.1 kg)   Body mass index is 45.83 kg/m.  Wt Readings from Last 3 Encounters:  05/17/18 267 lb (121.1 kg)  03/20/18 276 lb (125.2 kg)  12/22/17 281 lb 6.4 oz (127.6 kg)     Physical Exam GENERAL APPEARANCE: Appears stated age, well appearing, NAD EYES: conjunctiva clear, no icterus HEENT: bilateral tympanic membranes and ear canals normal, oropharynx with no erythema, no thyromegaly, trachea midline, no cervical or supraclavicular lymphadenopathy LUNGS: unlabored breathing, good air entry bilaterally, coarse breath sounds with slight wheeze on a couple of occasions with expiration CARDIOVASCULAR: Normal S1,S2 without murmurs, no edema SKIN: warm, dry        Assessment & Plan:   See Problem List for Assessment and Plan of chronic medical problems.

## 2018-05-17 NOTE — Patient Instructions (Addendum)
Have a chest x-ray today and we will call you with the results.  Use the cough syrup as needed. You can continue over the counter cold medications as needed.   We will start a steroid taper or antibiotic depending on the chest x-ray results.

## 2018-05-18 ENCOUNTER — Telehealth: Payer: Self-pay

## 2018-05-18 ENCOUNTER — Encounter: Payer: Self-pay | Admitting: Internal Medicine

## 2018-05-18 NOTE — Telephone Encounter (Signed)
Called pt to let her know we have not received any FMLA papers for her. I gave the updated fax number and told her that they can be scanned into mychart.

## 2018-05-18 NOTE — Telephone Encounter (Signed)
Copied from [place] [id number]. Topic: General - Other >> May 18, 2018  9:15 AM Bea Graff, NT wrote: Reason for CRM: Pts husband checking to see if the office received FMLA papers via fax. Please advise. >> May 18, 2018 11:23 AM Para Skeans A wrote: I do not have these form yet, Do you?

## 2018-05-19 MED ORDER — PREDNISONE 20 MG PO TABS: 40.0000 mg | ORAL_TABLET | Freq: Every day | ORAL | 0 refills | Status: DC

## 2018-05-25 NOTE — Telephone Encounter (Signed)
Charged for.

## 2018-06-05 ENCOUNTER — Encounter: Payer: Self-pay | Admitting: Internal Medicine

## 2018-06-13 ENCOUNTER — Encounter: Payer: Self-pay | Admitting: Internal Medicine

## 2018-06-22 NOTE — Progress Notes (Signed)
Subjective:    Patient ID: Danielle Chase, female    DOB: 1960/10/06, 58 y.o.   MRN: [id number]  HPI The patient is here for follow up.  Hypertension: She is taking her medication daily. She is compliant with a low sodium diet.  She denies chest pain, palpitations, edema, shortness of breath and regular headaches. She is exercising.  She does not monitor her blood pressure at home.    Diabetes: She is taking her medication daily as prescribed. She is compliant with a diabetic diet. She is exercising.  She checks her feet daily and denies foot lesions. She is up-to-date with an ophthalmology examination.   Hyperlipidemia: She is taking her medication daily. She is compliant with a low fat/cholesterol diet. She is exercising. She denies myalgias.   Depression, anxiety: She is taking her Cymbalta twice a week only. She denies any side effects from the medication. She feels her depression and anxiety are well controlled and she is happy with her current dose of medication.  Sleep apnea: She uses CPAP nightly.    Medications and allergies reviewed with patient and updated if appropriate.  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     DULoxetine (CYMBALTA) 20 MG capsule TAKE 1 CAPSULE(20 MG) BY MOUTH DAILY 90 capsule 0   fluticasone (FLONASE) 50 MCG/ACT nasal spray INSTILL 2 SPRAYS IN EACH  NOSTRIL DAILY PRN 48 g 1   hydrochlorothiazide (HYDRODIURIL) 25 MG tablet TAKE 1 TABLET(25 MG) BY MOUTH DAILY 90 tablet 1   losartan (COZAAR) 50 MG tablet TAKE 1 TABLET(50 MG) BY MOUTH DAILY 90 tablet 1   metFORMIN (GLUCOPHAGE-XR) 500 MG 24 hr tablet TAKE 3 TABLETS(1500 MG) BY MOUTH DAILY WITH BREAKFAST 270 tablet 0   Multiple Vitamins tablet TAKE 1 TABLET BY MOUTH DAILY 30 tablet 5   propranolol ER (INDERAL LA) 60 MG 24 hr capsule TAKE 1 CAPSULE BY MOUTH EVERY DAY 90 capsule 1   simvastatin (ZOCOR) 40 MG tablet TAKE 1/2 TABLET BY MOUTH EVERY NIGHT AT BEDTIME 45 tablet 1   No current facility-administered medications on file prior to visit.     Past Medical History:  Diagnosis Date   Allergy    Diabetes mellitus without complication ([place])    Hyperlipidemia    Hypertension     Past Surgical History:  Procedure Laterality Date   CESAREAN SECTION     x1   CHOLECYSTECTOMY     TUBAL LIGATION      Social History   Socioeconomic History   Marital status: Married    Spouse name: Not on file   Number of children: Not on file   Years of education: Not on file   Highest education level: Not on file  Occupational History   Not on file  Social Needs   Financial resource strain: Not on file   Food insecurity:    Worry: Not  on file    Inability: Not on file   Transportation needs:    Medical: Not on file    Non-medical: Not on file  Tobacco Use   Smoking status: Former Smoker    Types: Cigarettes    Last attempt to quit: 05/01/2001    Years since quitting: 17.1   Smokeless tobacco: Never Used   Tobacco comment: quit 12 yrs ago  Substance and Sexual Activity   Alcohol use: Yes    Alcohol/week: 0.0 standard drinks    Comment: socially   Drug use: No   Sexual activity: Yes    Partners: Male  Lifestyle   Physical activity:    Days per week: Not on file    Minutes per session: Not on file   Stress: Not on file  Relationships   Social  connections:    Talks on phone: Not on file    Gets together: Not on file    Attends religious service: Not on file    Active member of club or organization: Not on file    Attends meetings of clubs or organizations: Not on file    Relationship status: Not on file  Other Topics Concern   Not on file  Social History Narrative   Not on file    Family History  Problem Relation Age of Onset   COPD Mother    Heart disease Father    Breast cancer Sister    Colon cancer Neg Hx    Rectal cancer Neg Hx    Stomach cancer Neg Hx    Diabetes Brother     Review of Systems  Constitutional: Negative for chills and fever.  Respiratory: Negative for cough, shortness of breath and wheezing.   Cardiovascular: Negative for chest pain, palpitations and leg swelling.  Neurological: Positive for headaches (occ). Negative for light-headedness.       Objective:   Vitals:   06/23/18 0747  BP: 136/88  Pulse: 68  Resp: 16  Temp: 98.5 F (36.9 C)  SpO2: 96%   BP Readings from Last 3 Encounters:  06/23/18 136/88  05/17/18 120/84  03/20/18 122/62   Wt Readings from Last 3 Encounters:  06/23/18 271 lb 6.4 oz (123.1 kg)  05/17/18 267 lb (121.1 kg)  03/20/18 276 lb (125.2 kg)   Body mass index is 46.59 kg/m.   Physical Exam    Constitutional: Appears well-developed and well-nourished. No distress.  HENT:  Head: Normocephalic and atraumatic.  Neck: Neck supple. No tracheal deviation present. No thyromegaly present.  No cervical lymphadenopathy Cardiovascular: Normal rate, regular rhythm and normal heart sounds.   No murmur heard. No carotid bruit .  No edema Pulmonary/Chest: Effort normal and breath sounds normal. No respiratory distress. No has no wheezes. No rales.  Skin: Skin is warm and dry. Not diaphoretic.  Psychiatric: Normal mood and affect. Behavior is normal.      Assessment & Plan:    See Problem List for Assessment and Plan of chronic medical problems.

## 2018-06-22 NOTE — Patient Instructions (Addendum)
  Tests ordered today. Your results will be released to [place] (or called to you) after review, usually within 72hours after test completion. If any changes need to be made, you will be notified at that same time.   Medications reviewed and updated.  Changes include :   Start ozempic for your sugars.  Take it once a week.    Your prescription(s) have been submitted to your pharmacy. Please take as directed and contact our office if you believe you are having problem(s) with the medication(s).   Please followup in 6 months

## 2018-06-23 ENCOUNTER — Other Ambulatory Visit: Payer: Self-pay

## 2018-06-23 ENCOUNTER — Encounter: Payer: Self-pay | Admitting: Internal Medicine

## 2018-06-23 ENCOUNTER — Other Ambulatory Visit: Payer: Self-pay | Admitting: Internal Medicine

## 2018-06-23 ENCOUNTER — Ambulatory Visit (INDEPENDENT_AMBULATORY_CARE_PROVIDER_SITE_OTHER): Payer: 59 | Admitting: Internal Medicine

## 2018-06-23 ENCOUNTER — Other Ambulatory Visit (INDEPENDENT_AMBULATORY_CARE_PROVIDER_SITE_OTHER): Payer: 59

## 2018-06-23 VITALS — BP 136/88 | HR 68 | Temp 98.5°F | Resp 16 | Ht 64.0 in | Wt 271.4 lb

## 2018-06-23 DIAGNOSIS — E119 Type 2 diabetes mellitus without complications: Secondary | ICD-10-CM

## 2018-06-23 DIAGNOSIS — I10 Essential (primary) hypertension: Secondary | ICD-10-CM

## 2018-06-23 DIAGNOSIS — G4733 Obstructive sleep apnea (adult) (pediatric): Secondary | ICD-10-CM | POA: Diagnosis not present

## 2018-06-23 DIAGNOSIS — E782 Mixed hyperlipidemia: Secondary | ICD-10-CM

## 2018-06-23 DIAGNOSIS — F329 Major depressive disorder, single episode, unspecified: Secondary | ICD-10-CM

## 2018-06-23 DIAGNOSIS — F32A Depression, unspecified: Secondary | ICD-10-CM

## 2018-06-23 DIAGNOSIS — F419 Anxiety disorder, unspecified: Secondary | ICD-10-CM

## 2018-06-23 LAB — COMPREHENSIVE METABOLIC PANEL
ALT: 21 U/L (ref 0–35)
AST: 17 U/L (ref 0–37)
Albumin: 4.1 g/dL (ref 3.5–5.2)
Alkaline Phosphatase: 57 U/L (ref 39–117)
BILIRUBIN TOTAL: 0.8 mg/dL (ref 0.2–1.2)
BUN: 19 mg/dL (ref 6–23)
CO2: 29 mEq/L (ref 19–32)
Calcium: 9.2 mg/dL (ref 8.4–10.5)
Chloride: 99 mEq/L (ref 96–112)
Creatinine, Ser: 1.1 mg/dL (ref 0.40–1.20)
GFR: 50.96 mL/min — ABNORMAL LOW (ref >60.00)
Glucose, Bld: 203 mg/dL — ABNORMAL HIGH (ref 70–99)
Potassium: 3.5 mEq/L (ref 3.5–5.1)
Sodium: 137 mEq/L (ref 135–145)
Total Protein: 7.3 g/dL (ref 6.0–8.3)

## 2018-06-23 LAB — LIPID PANEL
Cholesterol: 122 mg/dL (ref 0–200)
HDL: 37.9 mg/dL — ABNORMAL LOW
LDL Cholesterol: 60 mg/dL (ref 0–99)
NonHDL: 83.81
Total CHOL/HDL Ratio: 3
Triglycerides: 121 mg/dL (ref 0.0–149.0)
VLDL: 24.2 mg/dL (ref 0.0–40.0)

## 2018-06-23 LAB — HEMOGLOBIN A1C: Hgb A1c MFr Bld: 5.5 % (ref 4.6–6.5)

## 2018-06-23 MED ORDER — DULOXETINE HCL 20 MG PO CPEP: ORAL_CAPSULE | ORAL | 1 refills | Status: DC

## 2018-06-23 MED ORDER — FLUTICASONE PROPIONATE 50 MCG/ACT NA SUSP: NASAL | 1 refills | Status: DC

## 2018-06-23 MED ORDER — METFORMIN HCL ER 500 MG PO TB24: ORAL_TABLET | ORAL | 0 refills | Status: DC

## 2018-06-23 MED ORDER — SEMAGLUTIDE(0.25 OR 0.5MG/DOS) 2 MG/1.5ML ~~LOC~~ SOPN: 0.2500 mg | PEN_INJECTOR | SUBCUTANEOUS | 5 refills | Status: DC

## 2018-06-23 MED ORDER — LOSARTAN POTASSIUM 50 MG PO TABS: ORAL_TABLET | ORAL | 1 refills | Status: DC

## 2018-06-23 MED ORDER — METFORMIN HCL ER 500 MG PO TB24: ORAL_TABLET | ORAL | 1 refills | Status: DC

## 2018-06-23 NOTE — Assessment & Plan Note (Signed)
Check a1c Low sugar / carb diet Stressed regular exercise Will start ozempic which should help with weight loss

## 2018-06-23 NOTE — Assessment & Plan Note (Signed)
Using cpap nightly 

## 2018-06-23 NOTE — Assessment & Plan Note (Signed)
Check lipid panel  Continue daily statin Regular exercise and healthy diet encouraged  

## 2018-06-23 NOTE — Assessment & Plan Note (Signed)
BP well controlled Current regimen effective and well tolerated Continue current medications at current doses cmp  

## 2018-06-23 NOTE — Assessment & Plan Note (Signed)
Controlled, stable Continue current dose of medication  

## 2018-06-26 ENCOUNTER — Ambulatory Visit: Payer: 59 | Admitting: Internal Medicine

## 2018-07-09 ENCOUNTER — Other Ambulatory Visit: Payer: Self-pay | Admitting: Internal Medicine

## 2018-09-07 ENCOUNTER — Other Ambulatory Visit: Payer: Self-pay | Admitting: Internal Medicine

## 2018-09-21 ENCOUNTER — Encounter: Payer: Self-pay | Admitting: Primary Care

## 2018-09-21 ENCOUNTER — Other Ambulatory Visit: Payer: Self-pay

## 2018-09-21 ENCOUNTER — Ambulatory Visit: Payer: 59 | Admitting: Primary Care

## 2018-09-21 VITALS — BP 120/78 | HR 75 | Temp 98.2°F | Ht 64.0 in | Wt 266.2 lb

## 2018-09-21 DIAGNOSIS — G4733 Obstructive sleep apnea (adult) (pediatric): Secondary | ICD-10-CM | POA: Diagnosis not present

## 2018-09-21 NOTE — Patient Instructions (Addendum)
No changes to pressure settings today  Referral to [facility] for cpap supplies (head gear, mask, tubing)  Continue to wear CPAP every night for 4-6 hours or more   Do not drive if experiencing excessive daytime fatigue or somnolence  Follow up in 1 year with Dr. Elsworth Soho or sooner if needed    Sleep Apnea Sleep apnea affects breathing during sleep. It causes breathing to stop for a short time or to become shallow. It can also increase the risk of:  Heart attack.  Stroke.  Being very overweight (obese).  Diabetes.  Heart failure.  Irregular heartbeat. The goal of treatment is to help you breathe normally again. What are the causes? There are three kinds of sleep apnea:  Obstructive sleep apnea. This is caused by a blocked or collapsed airway.  Central sleep apnea. This happens when the brain does not send the right signals to the muscles that control breathing.  Mixed sleep apnea. This is a combination of obstructive and central sleep apnea. The most common cause of this condition is a collapsed or blocked airway. This can happen if:  Your throat muscles are too relaxed.  Your tongue and tonsils are too large.  You are overweight.  Your airway is too small. What increases the risk?  Being overweight.  Smoking.  Having a small airway.  Being older.  Being female.  Drinking alcohol.  Taking medicines to calm yourself (sedatives or tranquilizers).  Having family members with the condition. What are the signs or symptoms?  Trouble staying asleep.  Being sleepy or tired during the day.  Getting angry a lot.  Loud snoring.  Headaches in the morning.  Not being able to focus your mind (concentrate).  Forgetting things.  Less interest in sex.  Mood swings.  Personality changes.  Feelings of sadness (depression).  Waking up a lot during the night to pee (urinate).  Dry mouth.  Sore throat. How is this diagnosed?  Your medical history.  A  physical exam.  A test that is done when you are sleeping (sleep study). The test is most often done in a sleep lab but may also be done at home. How is this treated?   Sleeping on your side.  Using a medicine to get rid of mucus in your nose (decongestant).  Avoiding the use of alcohol, medicines to help you relax, or certain pain medicines (narcotics).  Losing weight, if needed.  Changing your diet.  Not smoking.  Using a machine to open your airway while you sleep, such as: ? An oral appliance. This is a mouthpiece that shifts your lower jaw forward. ? A CPAP device. This device blows air through a mask when you breathe out (exhale). ? An EPAP device. This has valves that you put in each nostril. ? A BPAP device. This device blows air through a mask when you breathe in (inhale) and breathe out.  Having surgery if other treatments do not work. It is important to get treatment for sleep apnea. Without treatment, it can lead to:  High blood pressure.  Coronary artery disease.  In men, not being able to have an erection (impotence).  Reduced thinking ability. Follow these instructions at home: Lifestyle  Make changes that your doctor recommends.  Eat a healthy diet.  Lose weight if needed.  Avoid alcohol, medicines to help you relax, and some pain medicines.  Do not use any products that contain nicotine or tobacco, such as cigarettes, e-cigarettes, and chewing tobacco. If you  need help quitting, ask your doctor. General instructions  Take over-the-counter and prescription medicines only as told by your doctor.  If you were given a machine to use while you sleep, use it only as told by your doctor.  If you are having surgery, make sure to tell your doctor you have sleep apnea. You may need to bring your device with you.  Keep all follow-up visits as told by your doctor. This is important. Contact a doctor if:  The machine that you were given to use during sleep  bothers you or does not seem to be working.  You do not get better.  You get worse. Get help right away if:  Your chest hurts.  You have trouble breathing in enough air.  You have an uncomfortable feeling in your back, arms, or stomach.  You have trouble talking.  One side of your body feels weak.  A part of your face is hanging down. These symptoms may be an emergency. Do not wait to see if the symptoms will go away. Get medical help right away. Call your local emergency services (911 in the [place] Do not drive yourself to the hospital. Summary  This condition affects breathing during sleep.  The most common cause is a collapsed or blocked airway.  The goal of treatment is to help you breathe normally while you sleep. This information is not intended to replace advice given to you by your health care provider. Make sure you discuss any questions you have with your health care provider. Document Released: 01/06/2008 Document Revised: 11/22/2017 Document Reviewed: 11/22/2017 Elsevier Interactive Patient Education  [employer].

## 2018-09-21 NOTE — Assessment & Plan Note (Addendum)
-   Compliant with CPAP use - Pressure auto titrate 5-15cm h20; AHI 0.9 - Referral to DME for needs new supplies  - Encouraged to work on weight loss and staying active - Do not drive if experiencing excessive daytime fatigue or somnolence - FU in 1 year with Dr. Elsworth Soho or sooner if needed

## 2018-09-21 NOTE — Progress Notes (Signed)
@Patient  ID: Danielle Chase, female    DOB: 10-06-1960, 58 y.o.   MRN: [id number]  Chief Complaint  Patient presents with  . Follow-up    follow up cpap-supplies    Referring provider: Binnie Rail, MD  HPI: 58 year old female, former smoker. PMH OSA. Patient of Dr. Elsworth Soho, last seen on 10/03/17 by pulmonary NP. HST in 2018 showed severe obstructive sleep apnea with an AHI of 48/hr. Maintained on CPAP auto titrate 5-15cm H20.   09/21/2018 Patient presents today for annual OSA follow-up. She is doing well, no acute complaints. She is compliant with CPAP use. Needs new supplies including headgear, tubing and nasal mask. Reports benefit in fatigue with use.   [facility] download 90 days: - Usage 88/90 days, 64% >4 hours - Average usage 4 hours 31 mins - Pressure 5-15cm h20 ( 10.9cm h20 95%) - Min air leaks - AHI 0.9  09/21/2018   No Known Allergies  Immunization History  Administered Date(s) Administered  . Influenza Split 01/05/2012  . Influenza,inj,Quad PF,6+ Mos 12/26/2012, 01/15/2014, 12/23/2015, 12/22/2016, 12/22/2017  . Influenza-Unspecified 01/11/2015  . Tdap 06/27/2007, 12/22/2017    Past Medical History:  Diagnosis Date  . Allergy   . Diabetes mellitus without complication ([place])   . Hyperlipidemia   . Hypertension     Tobacco History: Social History   Tobacco Use  Smoking Status Former Smoker  . Types: Cigarettes  . Quit date: 05/01/2001  . Years since quitting: 17.4  Smokeless Tobacco Never Used  Tobacco Comment   quit 12 yrs ago   Counseling given: Not Answered Comment: quit 12 yrs ago   Outpatient Medications Prior to Visit  Medication Sig Dispense Refill  . aspirin 81 MG tablet Take 81 mg by mouth daily.    . DULoxetine (CYMBALTA) 20 MG capsule TAKE 1 CAPSULE(20 MG) BY MOUTH DAILY 90 capsule 1  . fluticasone (FLONASE) 50 MCG/ACT nasal spray INSTILL 2 SPRAYS IN EACH NOSTRIL DAILY PRN 48 g 1  . hydrochlorothiazide (HYDRODIURIL) 25 MG tablet TAKE  1 TABLET(25 MG) BY MOUTH DAILY 90 tablet 1  . losartan (COZAAR) 50 MG tablet TAKE 1 TABLET(50 MG) BY MOUTH DAILY 90 tablet 1  . metFORMIN (GLUCOPHAGE-XR) 500 MG 24 hr tablet TAKE 1 TABLET BY MOUTH DAILY WITH BREAKFAST 90 tablet 1  . Multiple Vitamins tablet TAKE 1 TABLET BY MOUTH DAILY 30 tablet 5  . propranolol ER (INDERAL LA) 60 MG 24 hr capsule TAKE 1 CAPSULE BY MOUTH EVERY DAY 90 capsule 1  . Semaglutide,0.25 or 0.5MG /DOS, (OZEMPIC, 0.25 OR 0.5 MG/DOSE,) 2 MG/1.5ML SOPN Inject 0.25 mg into the skin once a week. After 4 weeks increase to 0.5 mg weekly 1 pen 5  . simvastatin (ZOCOR) 40 MG tablet TAKE 1/2 TABLET BY MOUTH EVERY NIGHT AT BEDTIME 45 tablet 1   No facility-administered medications prior to visit.     Review of Systems  Review of Systems  Constitutional: Negative.   HENT: Negative.   Respiratory: Negative.   Cardiovascular: Negative.    Physical Exam  BP 120/78 (BP Location: Right Arm, Cuff Size: Normal)   Pulse 75   Temp 98.2 F (36.8 C)   Ht 5\' 4"  (1.626 m)   Wt 266 lb 3.2 oz (120.7 kg)   SpO2 96%   BMI 45.69 kg/m  Physical Exam Constitutional:      General: She is not in acute distress.    Appearance: Normal appearance. She is not ill-appearing.  HENT:  Mouth/Throat:     Mouth: Mucous membranes are moist.     Pharynx: Oropharynx is clear.  Neck:     Musculoskeletal: Normal range of motion and neck supple.  Cardiovascular:     Rate and Rhythm: Normal rate and regular rhythm.  Pulmonary:     Effort: Pulmonary effort is normal.     Breath sounds: Normal breath sounds.  Musculoskeletal: Normal range of motion.  Skin:    General: Skin is warm and dry.  Neurological:     General: No focal deficit present.     Mental Status: She is alert and oriented to person, place, and time. Mental status is at baseline.  Psychiatric:        Mood and Affect: Mood normal.        Behavior: Behavior normal.        Thought Content: Thought content normal.         Judgment: Judgment normal.      Lab Results:  CBC    Component Value Date/Time   WBC 9.6 03/20/2018 1615   RBC 4.54 03/20/2018 1615   HGB 14.9 03/20/2018 1615   HCT 42.3 03/20/2018 1615   PLT 227.0 03/20/2018 1615   MCV 93.2 03/20/2018 1615   MCV 97.7 (A) 01/29/2014 1546   MCH 32.9 02/27/2015 0959   MCHC 35.3 03/20/2018 1615   RDW 12.8 03/20/2018 1615   LYMPHSABS 2.0 03/20/2018 1615   MONOABS 1.4 (H) 03/20/2018 1615   EOSABS 0.3 03/20/2018 1615   BASOSABS 0.1 03/20/2018 1615    BMET    Component Value Date/Time   NA 137 06/23/2018 0822   K 3.5 06/23/2018 0822   CL 99 06/23/2018 0822   CO2 29 06/23/2018 0822   GLUCOSE 203 (H) 06/23/2018 0822   BUN 19 06/23/2018 0822   CREATININE 1.10 06/23/2018 0822   CREATININE 1.00 02/27/2015 0959   CALCIUM 9.2 06/23/2018 0822   GFRNONAA 64 02/27/2015 0959   GFRAA 73 02/27/2015 0959    BNP No results found for: BNP  ProBNP No results found for: PROBNP  Imaging: No results found.   Assessment & Plan:   OSA (obstructive sleep apnea) - Compliant with CPAP use - Pressure auto titrate 5-15cm h20; AHI 0.9 - Referral to DME for needs new supplies  - Encouraged to work on weight loss and staying active - Do not drive if experiencing excessive daytime fatigue or somnolence - FU in 1 year with Dr. Elsworth Soho or sooner if needed   Martyn Ehrich, NP 09/21/2018

## 2018-10-05 ENCOUNTER — Ambulatory Visit: Payer: 59 | Admitting: Adult Health

## 2018-11-06 ENCOUNTER — Other Ambulatory Visit: Payer: Self-pay | Admitting: Internal Medicine

## 2018-12-07 ENCOUNTER — Other Ambulatory Visit: Payer: Self-pay | Admitting: Internal Medicine

## 2018-12-26 NOTE — Patient Instructions (Addendum)
Tests ordered today. Your results will be released to [facility] (or called to you) after review.  If any changes need to be made, you will be notified at that same time.  All other Health Maintenance issues reviewed.   All recommended immunizations and age-appropriate screenings are up-to-date or discussed.  Flu immunization administered today.    Medications reviewed and updated.  Changes include :   none   Please followup in 6 months    Health Maintenance, Female Adopting a healthy lifestyle and getting preventive care are important in promoting health and wellness. Ask your health care provider about:  The right schedule for you to have regular tests and exams.  Things you can do on your own to prevent diseases and keep yourself healthy. What should I know about diet, weight, and exercise? Eat a healthy diet   Eat a diet that includes plenty of vegetables, fruits, low-fat dairy products, and lean protein.  Do not eat a lot of foods that are high in solid fats, added sugars, or sodium. Maintain a healthy weight Body mass index (BMI) is used to identify weight problems. It estimates body fat based on height and weight. Your health care provider can help determine your BMI and help you achieve or maintain a healthy weight. Get regular exercise Get regular exercise. This is one of the most important things you can do for your health. Most adults should:  Exercise for at least 150 minutes each week. The exercise should increase your heart rate and make you sweat (moderate-intensity exercise).  Do strengthening exercises at least twice a week. This is in addition to the moderate-intensity exercise.  Spend less time sitting. Even light physical activity can be beneficial. Watch cholesterol and blood lipids Have your blood tested for lipids and cholesterol at 59 years of age, then have this test every 5 years. Have your cholesterol levels checked more often if:  Your lipid or  cholesterol levels are high.  You are older than 59 years of age.  You are at high risk for heart disease. What should I know about cancer screening? Depending on your health history and family history, you may need to have cancer screening at various ages. This may include screening for:  Breast cancer.  Cervical cancer.  Colorectal cancer.  Skin cancer.  Lung cancer. What should I know about heart disease, diabetes, and high blood pressure? Blood pressure and heart disease  High blood pressure causes heart disease and increases the risk of stroke. This is more likely to develop in people who have high blood pressure readings, are of African descent, or are overweight.  Have your blood pressure checked: ? Every 3-5 years if you are 1-1 years of age. ? Every year if you are 54 years old or older. Diabetes Have regular diabetes screenings. This checks your fasting blood sugar level. Have the screening done:  Once every three years after age 50 if you are at a normal weight and have a low risk for diabetes.  More often and at a younger age if you are overweight or have a high risk for diabetes. What should I know about preventing infection? Hepatitis B If you have a higher risk for hepatitis B, you should be screened for this virus. Talk with your health care provider to find out if you are at risk for hepatitis B infection. Hepatitis C Testing is recommended for:  Everyone born from [birth year] through 1966.  Anyone with known risk factors for hepatitis C.  Sexually transmitted infections (STIs)  Get screened for STIs, including gonorrhea and chlamydia, if: ? You are sexually active and are younger than 59 years of age. ? You are older than 59 years of age and your health care provider tells you that you are at risk for this type of infection. ? Your sexual activity has changed since you were last screened, and you are at increased risk for chlamydia or gonorrhea. Ask your  health care provider if you are at risk.  Ask your health care provider about whether you are at high risk for HIV. Your health care provider may recommend a prescription medicine to help prevent HIV infection. If you choose to take medicine to prevent HIV, you should first get tested for HIV. You should then be tested every 3 months for as long as you are taking the medicine. Pregnancy  If you are about to stop having your period (premenopausal) and you may become pregnant, seek counseling before you get pregnant.  Take 400 to 800 micrograms (mcg) of folic acid every day if you become pregnant.  Ask for birth control (contraception) if you want to prevent pregnancy. Osteoporosis and menopause Osteoporosis is a disease in which the bones lose minerals and strength with aging. This can result in bone fractures. If you are 65 years old or older, or if you are at risk for osteoporosis and fractures, ask your health care provider if you should:  Be screened for bone loss.  Take a calcium or vitamin D supplement to lower your risk of fractures.  Be given hormone replacement therapy (HRT) to treat symptoms of menopause. Follow these instructions at home: Lifestyle  Do not use any products that contain nicotine or tobacco, such as cigarettes, e-cigarettes, and chewing tobacco. If you need help quitting, ask your health care provider.  Do not use street drugs.  Do not share needles.  Ask your health care provider for help if you need support or information about quitting drugs. Alcohol use  Do not drink alcohol if: ? Your health care provider tells you not to drink. ? You are pregnant, may be pregnant, or are planning to become pregnant.  If you drink alcohol: ? Limit how much you use to 0-1 drink a day. ? Limit intake if you are breastfeeding.  Be aware of how much alcohol is in your drink. In the [place] one drink equals one 12 oz bottle of beer (355 mL), one 5 oz glass of wine (148  mL), or one 1 oz glass of hard liquor (44 mL). General instructions  Schedule regular health, dental, and eye exams.  Stay current with your vaccines.  Tell your health care provider if: ? You often feel depressed. ? You have ever been abused or do not feel safe at home. Summary  Adopting a healthy lifestyle and getting preventive care are important in promoting health and wellness.  Follow your health care provider's instructions about healthy diet, exercising, and getting tested or screened for diseases.  Follow your health care provider's instructions on monitoring your cholesterol and blood pressure. This information is not intended to replace advice given to you by your health care provider. Make sure you discuss any questions you have with your health care provider. Document Released: 10/12/2010 Document Revised: 03/22/2018 Document Reviewed: 03/22/2018 Elsevier Patient Education  2020 Elsevier Inc.  

## 2018-12-26 NOTE — Progress Notes (Signed)
Subjective:    Patient ID: Danielle Chase, female    DOB: 01-Dec-1960, 58 y.o.   MRN: [id number]  HPI She is here for a physical exam.   She has no concerns.   Medications and allergies reviewed with patient and updated if appropriate.  Patient Active Problem List   Diagnosis Date Noted  . LLQ pain 03/20/2018  . Pain of right thumb 07/05/2017  . Onychomycosis 12/22/2016  . OSA (obstructive sleep apnea) 12/22/2016  . 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 Anxiety and depression 12/23/2015  . Morbid obesity ([place]) 09/26/2015  . 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 Carpal tunnel syndrome 06/05/2013  . Diabetes mellitus ([place]) 08/03/2011  . Hypertension 08/03/2011  . Hyperlipidemia 08/03/2011    Current Outpatient Medications on File Prior to Visit  Medication Sig Dispense Refill  . aspirin 81 MG tablet Take 81 mg by mouth daily.    . DULoxetine (CYMBALTA) 20 MG capsule TAKE 1 CAPSULE(20 MG) BY MOUTH DAILY 90 capsule 1  . fluticasone (FLONASE) 50 MCG/ACT nasal spray INSTILL 2 SPRAYS IN EACH NOSTRIL DAILY PRN 48 g 1  . hydrochlorothiazide (HYDRODIURIL) 25 MG tablet TAKE 1 TABLET(25 MG) BY MOUTH DAILY 90 tablet 1  . losartan (COZAAR) 50 MG tablet TAKE 1 TABLET(50 MG) BY MOUTH DAILY 90 tablet 1  . metFORMIN (GLUCOPHAGE-XR) 500 MG 24 hr tablet TAKE 1 TABLET(500 MG) BY MOUTH DAILY WITH BREAKFAST 90 tablet 0  . Multiple Vitamins tablet TAKE 1 TABLET BY MOUTH DAILY 30 tablet 5  . OZEMPIC, 0.25 OR 0.5 MG/DOSE, 2 MG/1.5ML SOPN INJECT 0.25 MG INTO THE SKIN ONCE A WEEK. AFTER 4 WEEKS INCREASE TO 0.5 MG WEEKLY 1.5 mL 0  . propranolol ER (INDERAL LA) 60 MG 24 hr capsule TAKE 1 CAPSULE BY MOUTH EVERY DAY 90 capsule 1  . simvastatin (ZOCOR) 40 MG tablet TAKE 1/2 TABLET BY MOUTH EVERY NIGHT AT BEDTIME 45 tablet 1   No current facility-administered medications on file prior to visit.     Past Medical History:  Diagnosis Date  . Allergy   . Diabetes mellitus without complication  ([place])   . Hyperlipidemia   . Hypertension     Past Surgical History:  Procedure Laterality Date  . CESAREAN SECTION     x1  . CHOLECYSTECTOMY    . TUBAL LIGATION      Social History   Socioeconomic History  . Marital status: Married    Spouse name: Not on file  . Number of children: Not on file  . Years of education: Not on file  . Highest education level: Not on file  Occupational History  . Not on file  Social Needs  . Financial resource strain: Not on file  . Food insecurity    Worry: Not on file    Inability: Not on file  . Transportation needs    Medical: Not on file    Non-medical: Not on file  Tobacco Use  . Smoking status: Former Smoker    Types: Cigarettes    Quit date: 05/01/2001    Years since quitting: 17.6  . Smokeless tobacco: Never Used  . Tobacco comment: quit 12 yrs ago  Substance and Sexual Activity  . Alcohol use: Yes    Alcohol/week: 0.0 standard drinks    Comment: socially  . Drug use: No  . Sexual activity: Yes    Partners: Male  Lifestyle  . Physical activity    Days per week: Not on file  Minutes per session: Not on file  . Stress: Not on file  Relationships  . Social Herbalist on phone: Not on file    Gets together: Not on file    Attends religious service: Not on file    Active member of club or organization: Not on file    Attends meetings of clubs or organizations: Not on file    Relationship status: Not on file  Other Topics Concern  . Not on file  Social History Narrative  . Not on file    Family History  Problem Relation Age of Onset  . COPD Mother   . Heart disease Father   . Breast cancer Sister   . Diabetes Brother   . Colon cancer Neg Hx   . Rectal cancer Neg Hx   . Stomach cancer Neg Hx     Review of Systems  Constitutional: Negative for chills and fever.  Eyes: Negative for visual disturbance.  Respiratory: Negative for cough, shortness of breath and wheezing.   Cardiovascular: Negative for  chest pain, palpitations and leg swelling.  Gastrointestinal: Negative for abdominal pain, blood in stool, constipation, diarrhea and nausea.       Gerd a little  Genitourinary: Negative for dysuria and hematuria.  Musculoskeletal: Negative for arthralgias and back pain.  Skin: Negative for color change and rash.  Neurological: Negative for light-headedness, numbness and headaches.       Objective:   Vitals:   12/27/18 0742  BP: 126/76  Pulse: 78  Resp: 16  Temp: 98.7 F (37.1 C)  SpO2: 97%   Filed Weights   12/27/18 0742  Weight: 269 lb 12.8 oz (122.4 kg)   Body mass index is 46.31 kg/m.  BP Readings from Last 3 Encounters:  12/27/18 126/76  09/21/18 120/78  06/23/18 136/88    Wt Readings from Last 3 Encounters:  12/27/18 269 lb 12.8 oz (122.4 kg)  09/21/18 266 lb 3.2 oz (120.7 kg)  06/23/18 271 lb 6.4 oz (123.1 kg)     Physical Exam Constitutional: She appears well-developed and well-nourished. No distress.  HENT:  Head: Normocephalic and atraumatic.  Right Ear: External ear normal. Normal ear canal and TM Left Ear: External ear normal.  Normal ear canal and TM Mouth/Throat: Oropharynx is clear and moist.  Eyes: Conjunctivae and EOM are normal.  Neck: Neck supple. No tracheal deviation present. No thyromegaly present.  No carotid bruit  Cardiovascular: Normal rate, regular rhythm and normal heart sounds.   No murmur heard.  No edema. Pulmonary/Chest: Effort normal and breath sounds normal. No respiratory distress. She has no wheezes. She has no rales.  Breast: deferred   Abdominal: Soft. She exhibits no distension. There is no tenderness.  Lymphadenopathy: She has no cervical adenopathy.  Skin: Skin is warm and dry. She is not diaphoretic.  Psychiatric: She has a normal mood and affect. Her behavior is normal.   Diabetic Foot Exam - Simple   Simple Foot Form Diabetic Foot exam was performed with the following findings: Yes 12/27/2018  8:10 AM  Visual  Inspection No deformities, no ulcerations, no other skin breakdown bilaterally: Yes Sensation Testing Intact to touch and monofilament testing bilaterally: Yes Pulse Check Posterior Tibialis and Dorsalis pulse intact bilaterally: Yes Comments         Assessment & Plan:   Physical exam: Screening blood work ordered Immunizations flu vaccine today, discussed shingrix Colonoscopy   Up to date  Mammogram  Up to date  Gyn  Up to date  Eye exams  Up to date  Exercise  None - encouraged regular exercise - she will try to start Weight  Advised weight loss  Skin   Skin tags, no other concerns Substance abuse  none  See Problem List for Assessment and Plan of chronic medical problems.   FU in 6 months

## 2018-12-27 ENCOUNTER — Other Ambulatory Visit (INDEPENDENT_AMBULATORY_CARE_PROVIDER_SITE_OTHER): Payer: 59

## 2018-12-27 ENCOUNTER — Ambulatory Visit (INDEPENDENT_AMBULATORY_CARE_PROVIDER_SITE_OTHER): Payer: 59 | Admitting: Internal Medicine

## 2018-12-27 ENCOUNTER — Other Ambulatory Visit: Payer: Self-pay

## 2018-12-27 ENCOUNTER — Encounter: Payer: Self-pay | Admitting: Internal Medicine

## 2018-12-27 VITALS — BP 126/76 | HR 78 | Temp 98.7°F | Resp 16 | Ht 64.0 in | Wt 269.8 lb

## 2018-12-27 DIAGNOSIS — I10 Essential (primary) hypertension: Secondary | ICD-10-CM

## 2018-12-27 DIAGNOSIS — Z23 Encounter for immunization: Secondary | ICD-10-CM

## 2018-12-27 DIAGNOSIS — Z0000 Encounter for general adult medical examination without abnormal findings: Secondary | ICD-10-CM | POA: Diagnosis not present

## 2018-12-27 DIAGNOSIS — E782 Mixed hyperlipidemia: Secondary | ICD-10-CM | POA: Diagnosis not present

## 2018-12-27 DIAGNOSIS — F419 Anxiety disorder, unspecified: Secondary | ICD-10-CM

## 2018-12-27 DIAGNOSIS — E119 Type 2 diabetes mellitus without complications: Secondary | ICD-10-CM

## 2018-12-27 DIAGNOSIS — F329 Major depressive disorder, single episode, unspecified: Secondary | ICD-10-CM

## 2018-12-27 DIAGNOSIS — G5603 Carpal tunnel syndrome, bilateral upper limbs: Secondary | ICD-10-CM

## 2018-12-27 LAB — LIPID PANEL
Cholesterol: 114 mg/dL (ref 0–200)
HDL: 38.1 mg/dL — ABNORMAL LOW (ref >39.00)
LDL Cholesterol: 50 mg/dL (ref 0–99)
NonHDL: 76.19
Total CHOL/HDL Ratio: 3
Triglycerides: 129 mg/dL (ref 0.0–149.0)
VLDL: 25.8 mg/dL (ref 0.0–40.0)

## 2018-12-27 LAB — CBC WITH DIFFERENTIAL/PLATELET
Basophils Absolute: 0.1 10*3/uL (ref 0.0–0.1)
Basophils Relative: 0.8 % (ref 0.0–3.0)
Eosinophils Absolute: 0.2 10*3/uL (ref 0.0–0.7)
Eosinophils Relative: 3.3 % (ref 0.0–5.0)
HCT: 42.2 % (ref 36.0–46.0)
Hemoglobin: 14.8 g/dL (ref 12.0–15.0)
Lymphocytes Relative: 20.9 % (ref 12.0–46.0)
Lymphs Abs: 1.5 10*3/uL (ref 0.7–4.0)
MCHC: 35 g/dL (ref 30.0–36.0)
MCV: 93.8 fl (ref 78.0–100.0)
Monocytes Absolute: 1 10*3/uL (ref 0.1–1.0)
Monocytes Relative: 14.7 % — ABNORMAL HIGH (ref 3.0–12.0)
Neutro Abs: 4.2 10*3/uL (ref 1.4–7.7)
Neutrophils Relative %: 60.3 % (ref 43.0–77.0)
Platelets: 218 10*3/uL (ref 150.0–400.0)
RBC: 4.5 Mil/uL (ref 3.87–5.11)
RDW: 13.2 % (ref 11.5–15.5)
WBC: 7 10*3/uL (ref 4.0–10.5)

## 2018-12-27 LAB — COMPREHENSIVE METABOLIC PANEL
ALT: 23 U/L (ref 0–35)
AST: 19 U/L (ref 0–37)
Albumin: 3.9 g/dL (ref 3.5–5.2)
Alkaline Phosphatase: 58 U/L (ref 39–117)
BUN: 16 mg/dL (ref 6–23)
CO2: 28 mEq/L (ref 19–32)
Calcium: 9.8 mg/dL (ref 8.4–10.5)
Chloride: 100 mEq/L (ref 96–112)
Creatinine, Ser: 1.04 mg/dL (ref 0.40–1.20)
GFR: 54.27 mL/min — ABNORMAL LOW (ref >60.00)
Glucose, Bld: 146 mg/dL — ABNORMAL HIGH (ref 70–99)
Potassium: 3.6 mEq/L (ref 3.5–5.1)
Sodium: 138 mEq/L (ref 135–145)
Total Bilirubin: 1 mg/dL (ref 0.2–1.2)
Total Protein: 7.2 g/dL (ref 6.0–8.3)

## 2018-12-27 LAB — TSH: TSH: 2.53 u[IU]/mL (ref 0.35–4.50)

## 2018-12-27 LAB — HEMOGLOBIN A1C: Hgb A1c MFr Bld: 6.3 % (ref 4.6–6.5)

## 2018-12-27 NOTE — Assessment & Plan Note (Signed)
With DM, htn, chol Encouraged weight loss Start regular exercise, decrease portions Continue ozempic F/u in 6 months

## 2018-12-27 NOTE — Assessment & Plan Note (Signed)
Check lipid panel, tsh, cmp  Continue daily statin Regular exercise and healthy diet encouraged  

## 2018-12-27 NOTE — Assessment & Plan Note (Signed)
Eye exam up to date Last a1c was normal Continue current medication Check a1c Work on starting exercise and weight loss

## 2018-12-27 NOTE — Addendum Note (Signed)
Addended by: Delice Bison E on: 12/27/2018 08:24 AM   Modules accepted: Orders

## 2018-12-27 NOTE — Assessment & Plan Note (Signed)
Controlled, stable Continue current dose of medication  

## 2018-12-27 NOTE — Assessment & Plan Note (Signed)
Has some b/l hand pain at night Advised wearing braces at night

## 2018-12-27 NOTE — Assessment & Plan Note (Signed)
BP well controlled Current regimen effective and well tolerated Continue current medications at current doses cmp  

## 2018-12-28 ENCOUNTER — Encounter: Payer: Self-pay | Admitting: Internal Medicine

## 2019-01-05 ENCOUNTER — Other Ambulatory Visit: Payer: Self-pay | Admitting: Internal Medicine

## 2019-01-07 ENCOUNTER — Other Ambulatory Visit: Payer: Self-pay | Admitting: Internal Medicine

## 2019-02-05 ENCOUNTER — Other Ambulatory Visit: Payer: Self-pay | Admitting: Internal Medicine

## 2019-03-06 ENCOUNTER — Other Ambulatory Visit: Payer: Self-pay | Admitting: Internal Medicine

## 2019-05-05 ENCOUNTER — Other Ambulatory Visit: Payer: Self-pay | Admitting: Internal Medicine

## 2019-05-10 ENCOUNTER — Other Ambulatory Visit: Payer: Self-pay | Admitting: Internal Medicine

## 2019-06-11 LAB — HM DIABETES EYE EXAM

## 2019-06-22 ENCOUNTER — Other Ambulatory Visit: Payer: Self-pay | Admitting: Internal Medicine

## 2019-06-25 NOTE — Progress Notes (Signed)
Subjective:    Patient ID: Danielle Chase, female    DOB: Jun 28, 1960, 59 y.o.   MRN: [id number]  HPI The patient is here for follow up of their chronic medical problems, including hypertension, diabetes, hyperlipidemia, depression, anxiety, OSA, obesity  She is taking all of her medications as prescribed.   She is not exercising regularly.  She has been compliant with a diabetic diet.      Medications and allergies reviewed with patient and updated if appropriate.  Patient Active Problem List   Diagnosis Date Noted  . LLQ pain 03/20/2018  . Pain of right thumb 07/05/2017  . Onychomycosis 12/22/2016  . OSA (obstructive sleep apnea) 12/22/2016  . 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 Anxiety and depression 12/23/2015  . Morbid obesity ([place]) 09/26/2015  . 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 Carpal tunnel syndrome 06/05/2013  . Diabetes mellitus ([place]) 08/03/2011  . Hypertension 08/03/2011  . Hyperlipidemia 08/03/2011    Current Outpatient Medications on File Prior to Visit  Medication Sig Dispense Refill  . aspirin 81 MG tablet Take 81 mg by mouth daily.    . DULoxetine (CYMBALTA) 20 MG capsule TAKE 1 CAPSULE(20 MG) BY MOUTH DAILY 90 capsule 1  . fluticasone (FLONASE) 50 MCG/ACT nasal spray INSTILL 2 SPRAYS IN EACH NOSTRIL DAILY PRN 48 g 1  . hydrochlorothiazide (HYDRODIURIL) 25 MG tablet TAKE 1 TABLET(25 MG) BY MOUTH DAILY 90 tablet 1  . losartan (COZAAR) 50 MG tablet TAKE 1 TABLET(50 MG) BY MOUTH DAILY 90 tablet 0  . metFORMIN (GLUCOPHAGE-XR) 500 MG 24 hr tablet TAKE 1 TABLET(500 MG) BY MOUTH DAILY WITH BREAKFAST 90 tablet 0  . Multiple Vitamins tablet TAKE 1 TABLET BY MOUTH DAILY 30 tablet 5  . OZEMPIC, 0.25 OR 0.5 MG/DOSE, 2 MG/1.5ML SOPN INJECTB 0.25 MGS INTO THE SKIN ONCE A WEEK FOR 4 WEEKS THEN INCREASE TO 0.5MG  WEEKLY 1.5 mL 3  . propranolol ER (INDERAL LA) 60 MG 24 hr capsule TAKE 1 CAPSULE BY MOUTH EVERY DAY 90 capsule 1  . simvastatin (ZOCOR) 40 MG  tablet TAKE 1/2 TABLET BY MOUTH EVERY NIGHT AT BEDTIME 45 tablet 1   No current facility-administered medications on file prior to visit.    Past Medical History:  Diagnosis Date  . Allergy   . Diabetes mellitus without complication ([place])   . Hyperlipidemia   . Hypertension     Past Surgical History:  Procedure Laterality Date  . CESAREAN SECTION     x1  . CHOLECYSTECTOMY    . TUBAL LIGATION      Social History   Socioeconomic History  . Marital status: Married    Spouse name: Not on file  . Number of children: Not on file  . Years of education: Not on file  . Highest education level: Not on file  Occupational History  . Not on file  Tobacco Use  . Smoking status: Former Smoker    Types: Cigarettes    Quit date: 05/01/2001    Years since quitting: 18.1  . Smokeless tobacco: Never Used  . Tobacco comment: quit 12 yrs ago  Substance and Sexual Activity  . Alcohol use: Yes    Alcohol/week: 0.0 standard drinks    Comment: socially  . Drug use: No  . Sexual activity: Yes    Partners: Male  Other Topics Concern  . Not on file  Social History Narrative  . Not on file   Social Determinants of Health   Financial Resource  Strain:   . Difficulty of Paying Living Expenses:   Food Insecurity:   . Worried About Charity fundraiser in the Last Year:   . Arboriculturist in the Last Year:   Transportation Needs:   . Film/video editor (Medical):   Marland Kitchen Lack of Transportation (Non-Medical):   Physical Activity:   . Days of Exercise per Week:   . Minutes of Exercise per Session:   Stress:   . Feeling of Stress :   Social Connections:   . Frequency of Communication with Friends and Family:   . Frequency of Social Gatherings with Friends and Family:   . Attends Religious Services:   . Active Member of Clubs or Organizations:   . Attends Archivist Meetings:   Marland Kitchen Marital Status:     Family History  Problem Relation Age of Onset  . COPD Mother   . Heart  disease Father   . Breast cancer Sister   . Diabetes Brother   . Colon cancer Neg Hx   . Rectal cancer Neg Hx   . Stomach cancer Neg Hx     Review of Systems  Constitutional: Negative for chills and fever.  Respiratory: Negative for cough, shortness of breath and wheezing.   Cardiovascular: Positive for palpitations (occ). Negative for chest pain and leg swelling.  Neurological: Negative for dizziness, light-headedness and headaches.       Objective:   Vitals:   06/26/19 0759  BP: 136/80  Pulse: 68  Resp: 16  Temp: 98.3 F (36.8 C)  SpO2: 97%   BP Readings from Last 3 Encounters:  06/26/19 136/80  12/27/18 126/76  09/21/18 120/78   Wt Readings from Last 3 Encounters:  06/26/19 270 lb (122.5 kg)  12/27/18 269 lb 12.8 oz (122.4 kg)  09/21/18 266 lb 3.2 oz (120.7 kg)   Body mass index is 46.35 kg/m.   Physical Exam    Constitutional: Appears well-developed and well-nourished. No distress.  HENT:  Head: Normocephalic and atraumatic.  Neck: Neck supple. No tracheal deviation present. No thyromegaly present.  No cervical lymphadenopathy Cardiovascular: Normal rate, regular rhythm and normal heart sounds.   No murmur heard. No carotid bruit .  No edema Pulmonary/Chest: Effort normal and breath sounds normal. No respiratory distress. No has no wheezes. No rales.  Skin: Skin is warm and dry. Not diaphoretic.  Psychiatric: Normal mood and affect. Behavior is normal.      Assessment & Plan:    See Problem List for Assessment and Plan of chronic medical problems.    This visit occurred during the SARS-CoV-2 public health emergency.  Safety protocols were in place, including screening questions prior to the visit, additional usage of staff PPE, and extensive cleaning of exam room while observing appropriate contact time as indicated for disinfecting solutions.

## 2019-06-25 NOTE — Patient Instructions (Addendum)
  Blood work was ordered.     Medications reviewed and updated.  Changes include :   Increase ozempic to 1mg weekly  Your prescription(s) have been submitted to your pharmacy. Please take as directed and contact our office if you believe you are having problem(s) with the medication(s).     Please followup in 6 months   

## 2019-06-26 ENCOUNTER — Encounter: Payer: Self-pay | Admitting: Internal Medicine

## 2019-06-26 ENCOUNTER — Other Ambulatory Visit: Payer: Self-pay

## 2019-06-26 ENCOUNTER — Ambulatory Visit: Payer: 59 | Admitting: Internal Medicine

## 2019-06-26 VITALS — BP 136/80 | HR 68 | Temp 98.3°F | Resp 16 | Ht 64.0 in | Wt 270.0 lb

## 2019-06-26 DIAGNOSIS — E782 Mixed hyperlipidemia: Secondary | ICD-10-CM

## 2019-06-26 DIAGNOSIS — I10 Essential (primary) hypertension: Secondary | ICD-10-CM | POA: Diagnosis not present

## 2019-06-26 DIAGNOSIS — F419 Anxiety disorder, unspecified: Secondary | ICD-10-CM | POA: Diagnosis not present

## 2019-06-26 DIAGNOSIS — F329 Major depressive disorder, single episode, unspecified: Secondary | ICD-10-CM

## 2019-06-26 DIAGNOSIS — E119 Type 2 diabetes mellitus without complications: Secondary | ICD-10-CM

## 2019-06-26 LAB — HEMOGLOBIN A1C: Hgb A1c MFr Bld: 5.9 % (ref 4.6–6.5)

## 2019-06-26 LAB — LIPID PANEL
Cholesterol: 117 mg/dL (ref 0–200)
HDL: 40 mg/dL (ref >39.00)
LDL Cholesterol: 57 mg/dL (ref 0–99)
NonHDL: 76.7
Total CHOL/HDL Ratio: 3
Triglycerides: 97 mg/dL (ref 0.0–149.0)
VLDL: 19.4 mg/dL (ref 0.0–40.0)

## 2019-06-26 LAB — COMPREHENSIVE METABOLIC PANEL
ALT: 24 U/L (ref 0–35)
AST: 21 U/L (ref 0–37)
Albumin: 3.8 g/dL (ref 3.5–5.2)
Alkaline Phosphatase: 54 U/L (ref 39–117)
BUN: 19 mg/dL (ref 6–23)
CO2: 33 mEq/L — ABNORMAL HIGH (ref 19–32)
Calcium: 9.4 mg/dL (ref 8.4–10.5)
Chloride: 100 mEq/L (ref 96–112)
Creatinine, Ser: 1.12 mg/dL (ref 0.40–1.20)
GFR: 49.73 mL/min — ABNORMAL LOW (ref >60.00)
Glucose, Bld: 138 mg/dL — ABNORMAL HIGH (ref 70–99)
Potassium: 4 mEq/L (ref 3.5–5.1)
Sodium: 139 mEq/L (ref 135–145)
Total Bilirubin: 0.9 mg/dL (ref 0.2–1.2)
Total Protein: 7.2 g/dL (ref 6.0–8.3)

## 2019-06-26 MED ORDER — OZEMPIC (1 MG/DOSE) 2 MG/1.5ML ~~LOC~~ SOPN: 1.0000 mg | PEN_INJECTOR | SUBCUTANEOUS | 5 refills | Status: DC

## 2019-06-26 NOTE — Assessment & Plan Note (Signed)
Chronic Check lipid panel  Continue daily statin Regular exercise and healthy diet encouraged  

## 2019-06-26 NOTE — Assessment & Plan Note (Signed)
Chronic Stressed weight loss Start exercising Decrease portions Increase ozempic to 1 mg weekly to see if that helps FU in 6 months

## 2019-06-26 NOTE — Assessment & Plan Note (Signed)
Chronic Controlled, stable Continue current dose of medication -cymbalta 20 mg daily

## 2019-06-26 NOTE — Assessment & Plan Note (Addendum)
Chronic Check a1c Increase ozempic to 1 mg to help with weight loss Low sugar / carb diet Stressed regular exercise Stressed weight loss

## 2019-06-26 NOTE — Assessment & Plan Note (Signed)
Chronic BP well controlled Current regimen effective and well tolerated Continue current medications at current doses cmp  

## 2019-06-27 ENCOUNTER — Encounter: Payer: Self-pay | Admitting: Internal Medicine

## 2019-07-04 ENCOUNTER — Other Ambulatory Visit: Payer: Self-pay | Admitting: Internal Medicine

## 2019-07-09 ENCOUNTER — Encounter: Payer: Self-pay | Admitting: Internal Medicine

## 2019-07-12 ENCOUNTER — Other Ambulatory Visit: Payer: Self-pay

## 2019-07-12 MED ORDER — METFORMIN HCL ER 500 MG PO TB24: ORAL_TABLET | ORAL | 1 refills | Status: DC

## 2019-07-18 ENCOUNTER — Other Ambulatory Visit: Payer: Self-pay | Admitting: Internal Medicine

## 2019-09-02 ENCOUNTER — Other Ambulatory Visit: Payer: Self-pay | Admitting: Internal Medicine

## 2019-09-09 IMAGING — DX DG CHEST 2V
2 series · 2 of 2 positions shown · non-contrast
Comparison: 11/05/2008

CLINICAL DATA: Cough and wheezing for 4 days. Former smoker.

EXAM:
CHEST - 2 VIEW

[chest pa]
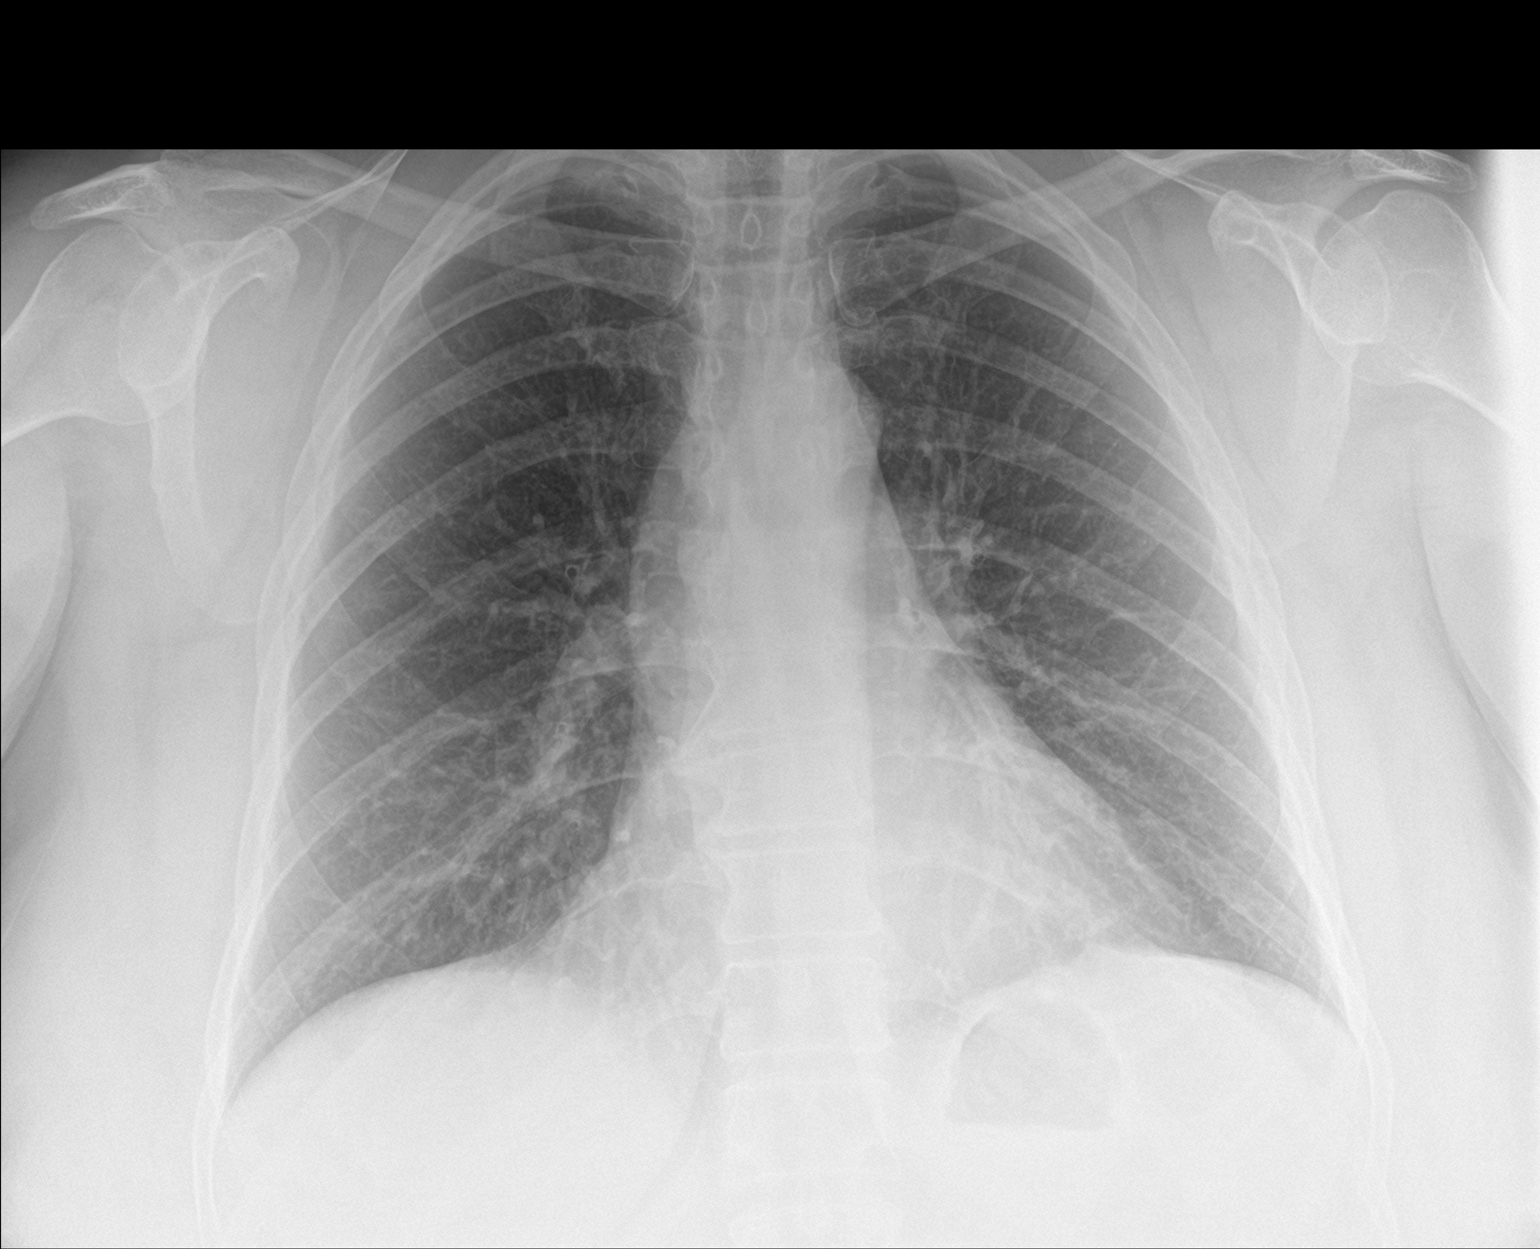

[chest lat]
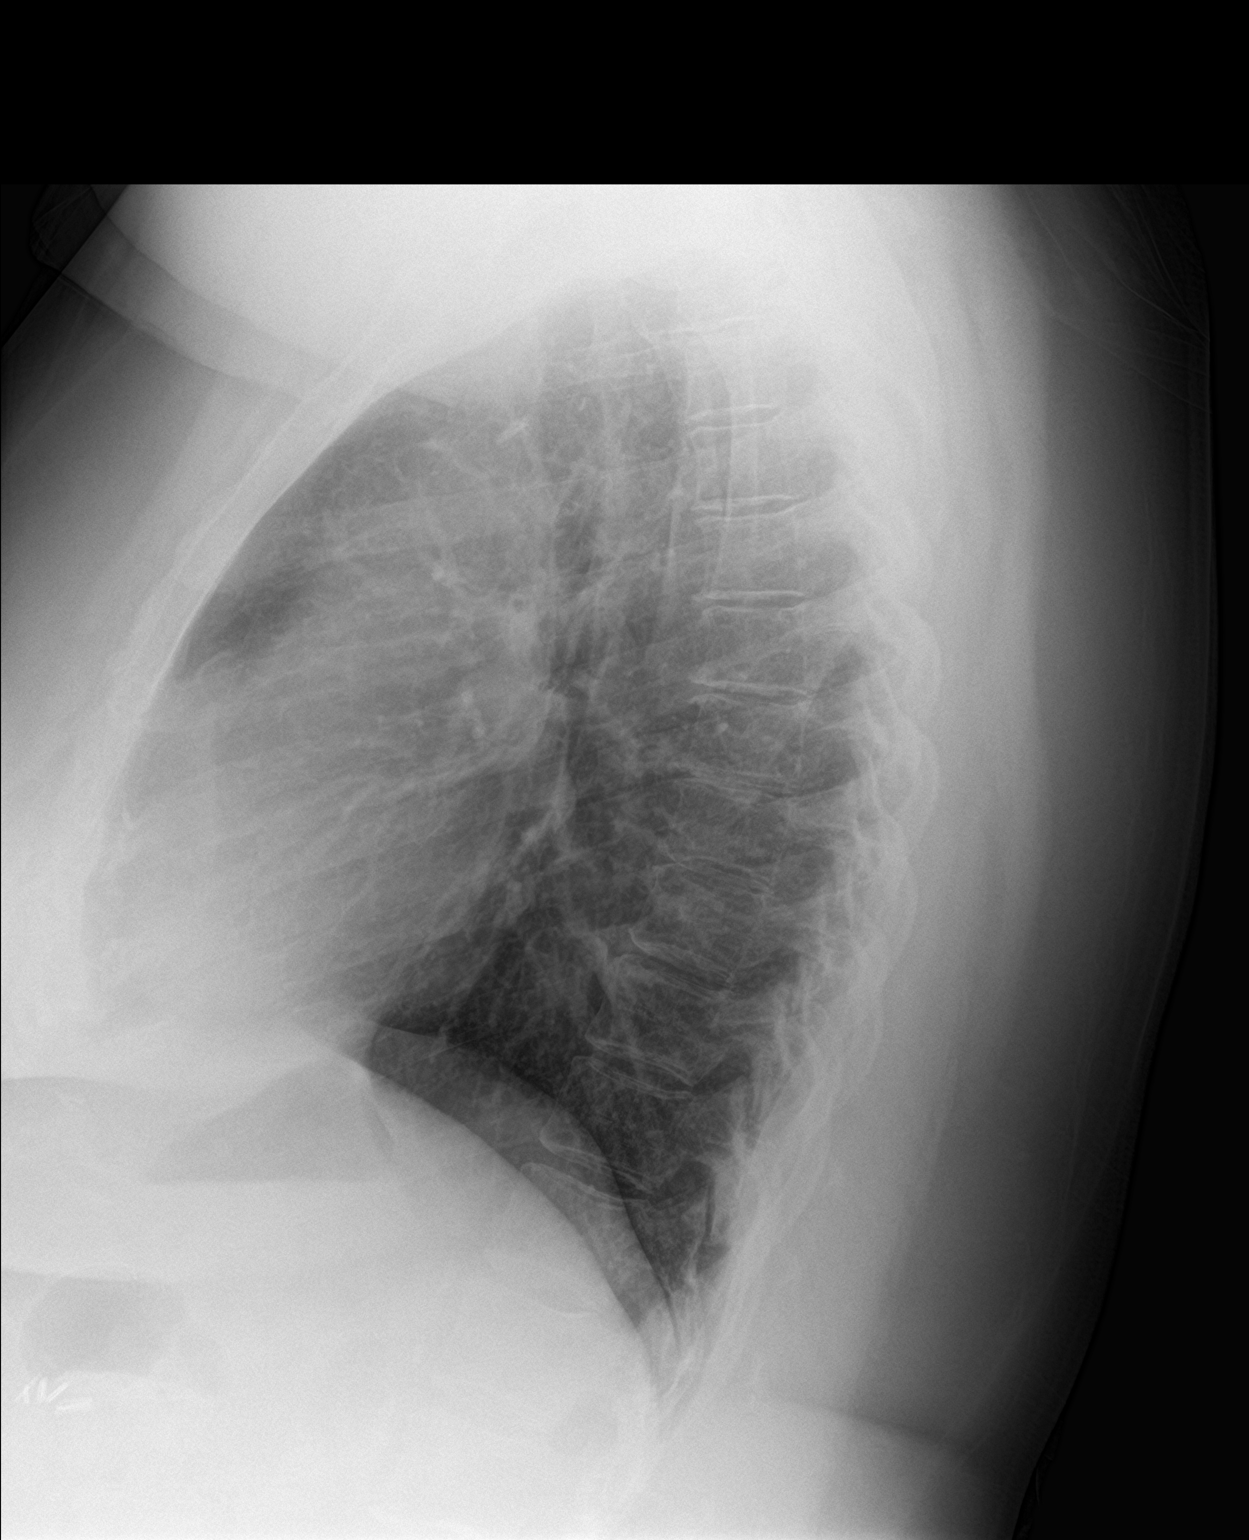

[2 of 2 positions shown; findings below may reference images not displayed]

FINDINGS: The cardiomediastinal silhouette is within normal limits. The lungs
are well inflated with mild peribronchial thickening. No airspace
consolidation, edema, pleural effusion, pneumothorax is identified.
No acute osseous abnormality is seen. Upper abdominal surgical clips
are noted.
IMPRESSION: Mild bronchitic changes.

## 2019-10-04 ENCOUNTER — Other Ambulatory Visit: Payer: Self-pay | Admitting: Internal Medicine

## 2019-10-11 ENCOUNTER — Ambulatory Visit: Payer: 59 | Admitting: Primary Care

## 2019-10-11 ENCOUNTER — Encounter: Payer: Self-pay | Admitting: Primary Care

## 2019-10-11 ENCOUNTER — Other Ambulatory Visit: Payer: Self-pay

## 2019-10-11 VITALS — BP 130/70 | HR 79 | Temp 97.3°F | Ht 64.0 in | Wt 270.2 lb

## 2019-10-11 DIAGNOSIS — G4733 Obstructive sleep apnea (adult) (pediatric): Secondary | ICD-10-CM | POA: Diagnosis not present

## 2019-10-11 NOTE — Assessment & Plan Note (Addendum)
-  Patient is compliant with CPAP and reports benefit from use -Auto CPAP 5 - 15 cm H2O; AHI 0.9 -No changes to pressure settings today -Renew CPAP orders with [facility] patient not to drive if experiencing excessive daytime fatigue or somnolence -Encouraged weight loss efforts -Follow-up in 1 year with Dr. Elsworth Soho or APP

## 2019-10-11 NOTE — Patient Instructions (Signed)
Seeing you today Danielle Chase  Recommendations: Continue to wear CPAP every night for minimum of 4 to 6 hours or more Not drive if experiencing excessive daytime fatigue or somnolence Continue to work on weight loss efforts  Orders: Renew CPAP orders auto titrate 5 to 15 cm H2O, supplies and reenrolling [employer] with patient's DME company which is [employer]  Follow-up: 1 year with Dr. Elsworth Soho or APP   Sleep Apnea Sleep apnea is a condition in which breathing pauses or becomes shallow during sleep. Episodes of sleep apnea usually last 10 seconds or longer, and they may occur as many as 20 times an hour. Sleep apnea disrupts your sleep and keeps your body from getting the rest that it needs. This condition can increase your risk of certain health problems, including:  Heart attack.  Stroke.  Obesity.  Diabetes.  Heart failure.  Irregular heartbeat. What are the causes? There are three kinds of sleep apnea:  Obstructive sleep apnea. This kind is caused by a blocked or collapsed airway.  Central sleep apnea. This kind happens when the part of the brain that controls breathing does not send the correct signals to the muscles that control breathing.  Mixed sleep apnea. This is a combination of obstructive and central sleep apnea. The most common cause of this condition is a collapsed or blocked airway. An airway can collapse or become blocked if:  Your throat muscles are abnormally relaxed.  Your tongue and tonsils are larger than normal.  You are overweight.  Your airway is smaller than normal. What increases the risk? You are more likely to develop this condition if you:  Are overweight.  Smoke.  Have a smaller than normal airway.  Are elderly.  Are female.  Drink alcohol.  Take sedatives or tranquilizers.  Have a family history of sleep apnea. What are the signs or symptoms? Symptoms of this condition include:  Trouble staying asleep.  Daytime sleepiness and  tiredness.  Irritability.  Loud snoring.  Morning headaches.  Trouble concentrating.  Forgetfulness.  Decreased interest in sex.  Unexplained sleepiness.  Mood swings.  Personality changes.  Feelings of depression.  Waking up often during the night to urinate.  Dry mouth.  Sore throat. How is this diagnosed? This condition may be diagnosed with:  A medical history.  A physical exam.  A series of tests that are done while you are sleeping (sleep study). These tests are usually done in a sleep lab, but they may also be done at home. How is this treated? Treatment for this condition aims to restore normal breathing and to ease symptoms during sleep. It may involve managing health issues that can affect breathing, such as high blood pressure or obesity. Treatment may include:  Sleeping on your side.  Using a decongestant if you have nasal congestion.  Avoiding the use of depressants, including alcohol, sedatives, and narcotics.  Losing weight if you are overweight.  Making changes to your diet.  Quitting smoking.  Using a device to open your airway while you sleep, such as: ? An oral appliance. This is a custom-made mouthpiece that shifts your lower jaw forward. ? A continuous positive airway pressure (CPAP) device. This device blows air through a mask when you breathe out (exhale). ? A nasal expiratory positive airway pressure (EPAP) device. This device has valves that you put into each nostril. ? A bi-level positive airway pressure (BPAP) device. This device blows air through a mask when you breathe in (inhale) and breathe out (  exhale).  Having surgery if other treatments do not work. During surgery, excess tissue is removed to create a wider airway. It is important to get treatment for sleep apnea. Without treatment, this condition can lead to:  High blood pressure.  Coronary artery disease.  In men, an inability to achieve or maintain an erection  (impotence).  Reduced thinking abilities. Follow these instructions at home: Lifestyle  Make any lifestyle changes that your health care provider recommends.  Eat a healthy, well-balanced diet.  Take steps to lose weight if you are overweight.  Avoid using depressants, including alcohol, sedatives, and narcotics.  Do not use any products that contain nicotine or tobacco, such as cigarettes, e-cigarettes, and chewing tobacco. If you need help quitting, ask your health care provider. General instructions  Take over-the-counter and prescription medicines only as told by your health care provider.  If you were given a device to open your airway while you sleep, use it only as told by your health care provider.  If you are having surgery, make sure to tell your health care provider you have sleep apnea. You may need to bring your device with you.  Keep all follow-up visits as told by your health care provider. This is important. Contact a health care provider if:  The device that you received to open your airway during sleep is uncomfortable or does not seem to be working.  Your symptoms do not improve.  Your symptoms get worse. Get help right away if:  You develop: ? Chest pain. ? Shortness of breath. ? Discomfort in your back, arms, or stomach.  You have: ? Trouble speaking. ? Weakness on one side of your body. ? Drooping in your face. These symptoms may represent a serious problem that is an emergency. Do not wait to see if the symptoms will go away. Get medical help right away. Call your local emergency services (911 in the [place] Do not drive yourself to the hospital. Summary  Sleep apnea is a condition in which breathing pauses or becomes shallow during sleep.  The most common cause is a collapsed or blocked airway.  The goal of treatment is to restore normal breathing and to ease symptoms during sleep. This information is not intended to replace advice given to you  by your health care provider. Make sure you discuss any questions you have with your health care provider. Document Revised: 09/13/2018 Document Reviewed: 11/22/2017 Elsevier Patient Education  [place].

## 2019-10-11 NOTE — Progress Notes (Signed)
@Patient  ID: [name], female    DOB: 1960/09/14, 59 y.o.   MRN: [id number]  Chief Complaint  Patient presents with  . Follow-up    OSA on CPAP    Referring provider: Binnie Rail, MD  HPI: 59 year old female, former smoker. PMH OSA, HTN, diabetes mellitus. Patient of Dr. Elsworth Soho, last seen by NP 09/21/18. HST in 2018 showed severe obstructive sleep apnea with an AHI of 48/hr. Maintained on CPAP auto titrate 5-15cm H20.   10/11/2019 Patient presents today for annual follow-up for OSA.  She is doing well with no acute complaints. She is compliant with CPAP use. No issues with mask fit or pressure settings. States that she has no trouble falling asleep or staying asleep.  She will wake up around 4 AM because of her cat needing to be fed.  She needs a prescription for new supplies.  She uses nasal pillow mask.  DME [facility] is [facility].  Epworth score today 3/24.  Review download 09/10/2019-10/09/2019 Usage 28/30 days (93%) Average hours used 5 hours 25 minutes Pressure setting 5-15 cm H2O (11.9cm H2O-95%) Air leaks 1.8 L/min AHI 0.9  No Known Allergies  Immunization History  Administered Date(s) Administered  . Influenza Split 01/05/2012  . Influenza,inj,Quad PF,6+ Mos 12/26/2012, 01/15/2014, 12/23/2015, 12/22/2016, 12/22/2017, 12/27/2018  . Influenza-Unspecified 01/11/2015  . PFIZER SARS-COV-2 Vaccination 07/02/2019, 07/23/2019  . Tdap 06/27/2007, 12/22/2017    Past Medical History:  Diagnosis Date  . Allergy   . Diabetes mellitus without complication ([place])   . Hyperlipidemia   . Hypertension     Tobacco History: Social History   Tobacco Use  Smoking Status Former Smoker  . Types: Cigarettes  . Quit date: 05/01/2001  . Years since quitting: 18.4  Smokeless Tobacco Never Used  Tobacco Comment   quit 12 yrs ago   Counseling given: Not Answered Comment: quit 12 yrs ago   Outpatient Medications Prior to Visit  Medication Sig Dispense Refill  . aspirin 81 MG  tablet Take 81 mg by mouth daily.    . DULoxetine (CYMBALTA) 20 MG capsule TAKE 1 CAPSULE(20 MG) BY MOUTH DAILY 90 capsule 1  . fluticasone (FLONASE) 50 MCG/ACT nasal spray SHAKE LIQUID AND USE 2 SPRAYS IN EACH NOSTRIL DAILY AS NEEDED 48 g 1  . hydrochlorothiazide (HYDRODIURIL) 25 MG tablet TAKE 1 TABLET(25 MG) BY MOUTH DAILY 90 tablet 1  . losartan (COZAAR) 50 MG tablet TAKE 1 TABLET(50 MG) BY MOUTH DAILY 90 tablet 0  . metFORMIN (GLUCOPHAGE-XR) 500 MG 24 hr tablet TAKE 1 TABLET(500 MG) BY MOUTH DAILY WITH BREAKFAST 90 tablet 1  . Multiple Vitamins tablet TAKE 1 TABLET BY MOUTH DAILY 30 tablet 5  . propranolol ER (INDERAL LA) 60 MG 24 hr capsule TAKE 1 CAPSULE BY MOUTH EVERY DAY 90 capsule 1  . Semaglutide, 1 MG/DOSE, (OZEMPIC, 1 MG/DOSE,) 2 MG/1.5ML SOPN Inject 1 mg into the skin once a week. 4 pen 5  . simvastatin (ZOCOR) 40 MG tablet TAKE 1/2 TABLET BY MOUTH EVERY NIGHT AT BEDTIME 45 tablet 1   No facility-administered medications prior to visit.    Review of Systems  Review of Systems  Constitutional: Negative.   Respiratory: Negative.   Cardiovascular: Negative.     Physical Exam  BP 130/70 (BP Location: Left Arm, Cuff Size: Large)   Pulse 79   Temp (!) 97.3 F (36.3 C) (Oral)   Ht 5\' 4"  (1.626 m)   Wt 270 lb 3.2 oz (122.6 kg)  SpO2 99%   BMI 46.38 kg/m  Physical Exam Constitutional:      General: She is not in acute distress.    Appearance: Normal appearance. She is well-developed. She is not ill-appearing.  HENT:     Head: Normocephalic and atraumatic.  Eyes:     Pupils: Pupils are equal, round, and reactive to light.  Cardiovascular:     Rate and Rhythm: Normal rate and regular rhythm.     Heart sounds: Normal heart sounds. No murmur heard.      Comments: RRR Pulmonary:     Effort: Pulmonary effort is normal. No respiratory distress.     Breath sounds: Normal breath sounds. No wheezing.     Comments: CTA Musculoskeletal:        General: Normal range of  motion.     Cervical back: Normal range of motion and neck supple.  Skin:    General: Skin is warm and dry.     Findings: No erythema or rash.  Neurological:     General: No focal deficit present.     Mental Status: She is alert and oriented to person, place, and time. Mental status is at baseline.  Psychiatric:        Mood and Affect: Mood normal.        Behavior: Behavior normal.        Thought Content: Thought content normal.        Judgment: Judgment normal.      Lab Results:  CBC    Component Value Date/Time   WBC 7.0 12/27/2018 0813   RBC 4.50 12/27/2018 0813   HGB 14.8 12/27/2018 0813   HCT 42.2 12/27/2018 0813   PLT 218.0 12/27/2018 0813   MCV 93.8 12/27/2018 0813   MCV 97.7 (A) 01/29/2014 1546   MCH 32.9 02/27/2015 0959   MCHC 35.0 12/27/2018 0813   RDW 13.2 12/27/2018 0813   LYMPHSABS 1.5 12/27/2018 0813   MONOABS 1.0 12/27/2018 0813   EOSABS 0.2 12/27/2018 0813   BASOSABS 0.1 12/27/2018 0813    BMET    Component Value Date/Time   NA 139 06/26/2019 0826   K 4.0 06/26/2019 0826   CL 100 06/26/2019 0826   CO2 33 (H) 06/26/2019 0826   GLUCOSE 138 (H) 06/26/2019 0826   BUN 19 06/26/2019 0826   CREATININE 1.12 06/26/2019 0826   CREATININE 1.00 02/27/2015 0959   CALCIUM 9.4 06/26/2019 0826   GFRNONAA 64 02/27/2015 0959   GFRAA 73 02/27/2015 0959    BNP No results found for: BNP  ProBNP No results found for: PROBNP  Imaging: No results found.   Assessment & Plan:   OSA (obstructive sleep apnea) -Patient is compliant with CPAP and reports benefit from use -Auto CPAP 5 - 15 cm H2O; AHI 0.9 -No changes to pressure settings today -Renew CPAP orders with [facility] patient not to drive if experiencing excessive daytime fatigue or somnolence -Encouraged weight loss efforts -Follow-up in 1 year with Dr. Elsworth Soho or APP   Martyn Ehrich, NP 10/11/2019

## 2019-12-26 NOTE — Progress Notes (Signed)
Subjective:    Patient ID: Danielle Chase, female    DOB: 1960-09-18, 59 y.o.   MRN: [id number]  HPI The patient is here for follow up of their chronic medical problems, including htn, DM, hyperlipidemia, depression, anxiety, OSA, obesity  She is taking all of her medications as prescribed.    She is not exercising regularly.     She has no energy.  She sits all day at work.  She sleeps about 6 hrs at night.    She will be retiring in December.    Medications and allergies reviewed with patient and updated if appropriate.  Patient Active Problem List   Diagnosis Date Noted  . 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 LLQ pain 03/20/2018  . Pain of right thumb 07/05/2017  . Onychomycosis 12/22/2016  . OSA (obstructive sleep apnea) 12/22/2016  . 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 Anxiety and depression 12/23/2015  . Morbid obesity ([place]) 09/26/2015  . 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 Carpal tunnel syndrome 06/05/2013  . Diabetes mellitus ([place]) 08/03/2011  . Hypertension 08/03/2011  . Hyperlipidemia 08/03/2011    Current Outpatient Medications on File Prior to Visit  Medication Sig Dispense Refill  . aspirin 81 MG tablet Take 81 mg by mouth daily.    . DULoxetine (CYMBALTA) 20 MG capsule TAKE 1 CAPSULE(20 MG) BY MOUTH DAILY 90 capsule 1  . fluticasone (FLONASE) 50 MCG/ACT nasal spray SHAKE LIQUID AND USE 2 SPRAYS IN EACH NOSTRIL DAILY AS NEEDED 48 g 1  . hydrochlorothiazide (HYDRODIURIL) 25 MG tablet TAKE 1 TABLET(25 MG) BY MOUTH DAILY 90 tablet 1  . losartan (COZAAR) 50 MG tablet TAKE 1 TABLET(50 MG) BY MOUTH DAILY 90 tablet 0  . metFORMIN (GLUCOPHAGE-XR) 500 MG 24 hr tablet TAKE 1 TABLET(500 MG) BY MOUTH DAILY WITH BREAKFAST 90 tablet 1  . Multiple Vitamins tablet TAKE 1 TABLET BY MOUTH DAILY 30 tablet 5  . propranolol ER (INDERAL LA) 60 MG 24 hr capsule TAKE 1 CAPSULE BY MOUTH EVERY DAY 90 capsule 1  . Semaglutide, 1 MG/DOSE,  (OZEMPIC, 1 MG/DOSE,) 2 MG/1.5ML SOPN Inject 1 mg into the skin once a week. 4 pen 5  . simvastatin (ZOCOR) 40 MG tablet TAKE 1/2 TABLET BY MOUTH EVERY NIGHT AT BEDTIME 45 tablet 1   No current facility-administered medications on file prior to visit.    Past Medical History:  Diagnosis Date  . Allergy   . Diabetes mellitus without complication ([place])   . Hyperlipidemia   . Hypertension     Past Surgical History:  Procedure Laterality Date  . CESAREAN SECTION     x1  . CHOLECYSTECTOMY    . TUBAL LIGATION      Social History   Socioeconomic History  . Marital status: Married    Spouse name: Not on file  . Number of children: Not on file  . Years of education: Not on file  . Highest education level: Not on file  Occupational History  . Not on file  Tobacco Use  . Smoking status: Former Smoker    Types: Cigarettes    Quit date: 05/01/2001    Years since quitting: 18.6  . Smokeless tobacco: Never Used  . Tobacco comment: quit 12 yrs ago  Substance and Sexual Activity  . Alcohol use: Yes    Alcohol/week: 0.0 standard drinks    Comment: socially  . Drug use: No  . Sexual activity: Yes  Partners: Male  Other Topics Concern  . Not on file  Social History Narrative  . Not on file   Social Determinants of Health   Financial Resource Strain:   . Difficulty of Paying Living Expenses: Not on file  Food Insecurity:   . Worried About Charity fundraiser in the Last Year: Not on file  . Ran Out of Food in the Last Year: Not on file  Transportation Needs:   . Lack of Transportation (Medical): Not on file  . Lack of Transportation (Non-Medical): Not on file  Physical Activity:   . Days of Exercise per Week: Not on file  . Minutes of Exercise per Session: Not on file  Stress:   . Feeling of Stress : Not on file  Social Connections:   . Frequency of Communication with Friends and Family: Not on file  . Frequency of Social Gatherings with Friends and Family: Not on file   . Attends Religious Services: Not on file  . Active Member of Clubs or Organizations: Not on file  . Attends Archivist Meetings: Not on file  . Marital Status: Not on file    Family History  Problem Relation Age of Onset  . COPD Mother   . Heart disease Father   . Breast cancer Sister   . Diabetes Brother   . Colon cancer Neg Hx   . Rectal cancer Neg Hx   . Stomach cancer Neg Hx     Review of Systems  Constitutional: Negative for chills and fever.  Respiratory: Negative for cough, shortness of breath and wheezing.   Cardiovascular: Negative for chest pain, palpitations and leg swelling.  Neurological: Negative for light-headedness and headaches.       Objective:   Vitals:   12/27/19 0818  BP: 120/82  Pulse: 77  Temp: 98.3 F (36.8 C)  SpO2: 96%   BP Readings from Last 3 Encounters:  12/27/19 120/82  10/11/19 130/70  06/26/19 136/80   Wt Readings from Last 3 Encounters:  12/27/19 268 lb 6.4 oz (121.7 kg)  10/11/19 270 lb 3.2 oz (122.6 kg)  06/26/19 270 lb (122.5 kg)   Body mass index is 46.07 kg/m.   Physical Exam    Constitutional: Appears well-developed and well-nourished. No distress.  HENT:  Head: Normocephalic and atraumatic.  Neck: Neck supple. No tracheal deviation present. No thyromegaly present.  No cervical lymphadenopathy Cardiovascular: Normal rate, regular rhythm and normal heart sounds.   No murmur heard. No carotid bruit .  No edema Pulmonary/Chest: Effort normal and breath sounds normal. No respiratory distress. No has no wheezes. No rales.  Skin: Skin is warm and dry. Not diaphoretic.  Psychiatric: Normal mood and affect. Behavior is normal.      Assessment & Plan:    See Problem List for Assessment and Plan of chronic medical problems.    This visit occurred during the SARS-CoV-2 public health emergency.  Safety protocols were in place, including screening questions prior to the visit, additional usage of staff PPE,  and extensive cleaning of exam room while observing appropriate contact time as indicated for disinfecting solutions.

## 2019-12-26 NOTE — Patient Instructions (Addendum)
  Blood work was ordered.   ° ° °Medications reviewed and updated.  Changes include :   none ° ° ° °Please followup in 6 months ° ° °

## 2019-12-27 ENCOUNTER — Encounter: Payer: Self-pay | Admitting: Internal Medicine

## 2019-12-27 ENCOUNTER — Ambulatory Visit: Payer: 59 | Admitting: Internal Medicine

## 2019-12-27 ENCOUNTER — Other Ambulatory Visit: Payer: Self-pay

## 2019-12-27 VITALS — BP 120/82 | HR 77 | Temp 98.3°F | Wt 268.4 lb

## 2019-12-27 DIAGNOSIS — I10 Essential (primary) hypertension: Secondary | ICD-10-CM | POA: Diagnosis not present

## 2019-12-27 DIAGNOSIS — F329 Major depressive disorder, single episode, unspecified: Secondary | ICD-10-CM

## 2019-12-27 DIAGNOSIS — E782 Mixed hyperlipidemia: Secondary | ICD-10-CM

## 2019-12-27 DIAGNOSIS — Z23 Encounter for immunization: Secondary | ICD-10-CM

## 2019-12-27 DIAGNOSIS — F419 Anxiety disorder, unspecified: Secondary | ICD-10-CM | POA: Diagnosis not present

## 2019-12-27 DIAGNOSIS — E119 Type 2 diabetes mellitus without complications: Secondary | ICD-10-CM

## 2019-12-27 DIAGNOSIS — D0430 Carcinoma in situ of skin of unspecified part of face: Secondary | ICD-10-CM | POA: Insufficient documentation

## 2019-12-27 DIAGNOSIS — G4733 Obstructive sleep apnea (adult) (pediatric): Secondary | ICD-10-CM

## 2019-12-27 DIAGNOSIS — R5383 Other fatigue: Secondary | ICD-10-CM

## 2019-12-27 NOTE — Assessment & Plan Note (Signed)
Chronic Check lipid panel  Continue daily statin Regular exercise and healthy diet encouraged  

## 2019-12-27 NOTE — Assessment & Plan Note (Signed)
Chronic Controlled, stable Continue current dose of medication cymbalta

## 2019-12-27 NOTE — Assessment & Plan Note (Signed)
Chronic Check a1c Low sugar / carb diet Stressed regular exercise Work on weight loss Continue current medication

## 2019-12-27 NOTE — Assessment & Plan Note (Signed)
Discussed importance of weight loss Stressed regular exercise  Discussed decreasing portions, decreasing sugars/carbs Increase veges, lean protin F/u in 6 months

## 2019-12-27 NOTE — Assessment & Plan Note (Signed)
Chronic BP well controlled Current regimen effective and well tolerated Continue current medications at current doses cmp  

## 2019-12-27 NOTE — Assessment & Plan Note (Signed)
Chronic She uses her cpap

## 2019-12-28 LAB — CBC WITH DIFFERENTIAL/PLATELET
Absolute Monocytes: 952 cells/uL — ABNORMAL HIGH (ref 200–950)
Basophils Absolute: 63 cells/uL (ref 0–200)
Basophils Relative: 0.9 %
Eosinophils Absolute: 189 cells/uL (ref 15–500)
Eosinophils Relative: 2.7 %
HCT: 43.4 % (ref 35.0–45.0)
Hemoglobin: 15 g/dL (ref 11.7–15.5)
Lymphs Abs: 1540 cells/uL (ref 850–3900)
MCH: 33 pg (ref 27.0–33.0)
MCHC: 34.6 g/dL (ref 32.0–36.0)
MCV: 95.4 fL (ref 80.0–100.0)
MPV: 10.4 fL (ref 7.5–12.5)
Monocytes Relative: 13.6 %
Neutro Abs: 4256 cells/uL (ref 1500–7800)
Neutrophils Relative %: 60.8 %
Platelets: 210 10*3/uL (ref 140–400)
RBC: 4.55 10*6/uL (ref 3.80–5.10)
RDW: 12.6 % (ref 11.0–15.0)
Total Lymphocyte: 22 %
WBC: 7 10*3/uL (ref 3.8–10.8)

## 2019-12-28 LAB — COMPLETE METABOLIC PANEL WITH GFR
AG Ratio: 1.4 (calc) (ref 1.0–2.5)
ALT: 22 U/L (ref 6–29)
AST: 18 U/L (ref 10–35)
Albumin: 4 g/dL (ref 3.6–5.1)
Alkaline phosphatase (APISO): 57 U/L (ref 37–153)
BUN/Creatinine Ratio: 18 (calc) (ref 6–22)
BUN: 19 mg/dL (ref 7–25)
CO2: 30 mmol/L (ref 20–32)
Calcium: 9.5 mg/dL (ref 8.6–10.4)
Chloride: 102 mmol/L (ref 98–110)
Creat: 1.08 mg/dL — ABNORMAL HIGH (ref 0.50–1.05)
GFR, Est African American: 65 mL/min/{1.73_m2} (ref >=60)
GFR, Est Non African American: 56 mL/min/{1.73_m2} — ABNORMAL LOW (ref >=60)
Globulin: 2.9 g/dL (calc) (ref 1.9–3.7)
Glucose, Bld: 114 mg/dL — ABNORMAL HIGH (ref 65–99)
Potassium: 4.3 mmol/L (ref 3.5–5.3)
Sodium: 141 mmol/L (ref 135–146)
Total Bilirubin: 0.8 mg/dL (ref 0.2–1.2)
Total Protein: 6.9 g/dL (ref 6.1–8.1)

## 2019-12-28 LAB — LIPID PANEL
Cholesterol: 129 mg/dL (ref <200)
HDL: 46 mg/dL — ABNORMAL LOW (ref >=50)
LDL Cholesterol (Calc): 66 mg/dL (calc)
Non-HDL Cholesterol (Calc): 83 mg/dL (calc) (ref <130)
Total CHOL/HDL Ratio: 2.8 (calc) (ref <5.0)
Triglycerides: 86 mg/dL (ref <150)

## 2019-12-28 LAB — HEMOGLOBIN A1C
Hgb A1c MFr Bld: 6 % of total Hgb — ABNORMAL HIGH (ref <5.7)
Mean Plasma Glucose: 126 (calc)
eAG (mmol/L): 7 (calc)

## 2019-12-28 LAB — TSH: TSH: 2.02 mIU/L (ref 0.40–4.50)

## 2019-12-31 ENCOUNTER — Other Ambulatory Visit: Payer: Self-pay | Admitting: Internal Medicine

## 2020-01-30 ENCOUNTER — Other Ambulatory Visit: Payer: Self-pay | Admitting: Internal Medicine

## 2020-02-04 ENCOUNTER — Other Ambulatory Visit: Payer: Self-pay | Admitting: Internal Medicine

## 2020-02-08 ENCOUNTER — Other Ambulatory Visit: Payer: Self-pay | Admitting: Internal Medicine

## 2020-02-29 ENCOUNTER — Other Ambulatory Visit: Payer: Self-pay | Admitting: Internal Medicine

## 2020-05-29 ENCOUNTER — Other Ambulatory Visit: Payer: Self-pay | Admitting: Internal Medicine

## 2020-06-22 NOTE — Patient Instructions (Addendum)
Blood work was ordered.     Medications changes include :   none     Please followup in 6 months     Health Maintenance, Female Adopting a healthy lifestyle and getting preventive care are important in promoting health and wellness. Ask your health care provider about:  The right schedule for you to have regular tests and exams.  Things you can do on your own to prevent diseases and keep yourself healthy. What should I know about diet, weight, and exercise? Eat a healthy diet  Eat a diet that includes plenty of vegetables, fruits, low-fat dairy products, and lean protein.  Do not eat a lot of foods that are high in solid fats, added sugars, or sodium.   Maintain a healthy weight Body mass index (BMI) is used to identify weight problems. It estimates body fat based on height and weight. Your health care provider can help determine your BMI and help you achieve or maintain a healthy weight. Get regular exercise Get regular exercise. This is one of the most important things you can do for your health. Most adults should:  Exercise for at least 150 minutes each week. The exercise should increase your heart rate and make you sweat (moderate-intensity exercise).  Do strengthening exercises at least twice a week. This is in addition to the moderate-intensity exercise.  Spend less time sitting. Even light physical activity can be beneficial. Watch cholesterol and blood lipids Have your blood tested for lipids and cholesterol at 61 years of age, then have this test every 5 years. Have your cholesterol levels checked more often if:  Your lipid or cholesterol levels are high.  You are older than 61 years of age.  You are at high risk for heart disease. What should I know about cancer screening? Depending on your health history and family history, you may need to have cancer screening at various ages. This may include screening for:  Breast cancer.  Cervical  cancer.  Colorectal cancer.  Skin cancer.  Lung cancer. What should I know about heart disease, diabetes, and high blood pressure? Blood pressure and heart disease  High blood pressure causes heart disease and increases the risk of stroke. This is more likely to develop in people who have high blood pressure readings, are of African descent, or are overweight.  Have your blood pressure checked: ? Every 3-5 years if you are 18-39 years of age. ? Every year if you are 40 years old or older. Diabetes Have regular diabetes screenings. This checks your fasting blood sugar level. Have the screening done:  Once every three years after age 40 if you are at a normal weight and have a low risk for diabetes.  More often and at a younger age if you are overweight or have a high risk for diabetes. What should I know about preventing infection? Hepatitis B If you have a higher risk for hepatitis B, you should be screened for this virus. Talk with your health care provider to find out if you are at risk for hepatitis B infection. Hepatitis C Testing is recommended for:  Everyone born from 1945 through 1965.  Anyone with known risk factors for hepatitis C. Sexually transmitted infections (STIs)  Get screened for STIs, including gonorrhea and chlamydia, if: ? You are sexually active and are younger than 61 years of age. ? You are older than 61 years of age and your health care provider tells you that you are at risk for this type   Your sexual activity has changed since you were last screened, and you are at increased risk for chlamydia or gonorrhea. Ask your health care provider if you are at risk.  Ask your health care provider about whether you are at high risk for HIV. Your health care provider may recommend a prescription medicine to help prevent HIV infection. If you choose to take medicine to prevent HIV, you should first get tested for HIV. You should then be tested every 3 months for as long as  you are taking the medicine. Pregnancy  If you are about to stop having your period (premenopausal) and you may become pregnant, seek counseling before you get pregnant.  Take 400 to 800 micrograms (mcg) of folic acid every day if you become pregnant.  Ask for birth control (contraception) if you want to prevent pregnancy. Osteoporosis and menopause Osteoporosis is a disease in which the bones lose minerals and strength with aging. This can result in bone fractures. If you are 72 years old or older, or if you are at risk for osteoporosis and fractures, ask your health care provider if you should:  Be screened for bone loss.  Take a calcium or vitamin D supplement to lower your risk of fractures.  Be given hormone replacement therapy (HRT) to treat symptoms of menopause. Follow these instructions at home: Lifestyle  Do not use any products that contain nicotine or tobacco, such as cigarettes, e-cigarettes, and chewing tobacco. If you need help quitting, ask your health care provider.  Do not use street drugs.  Do not share needles.  Ask your health care provider for help if you need support or information about quitting drugs. Alcohol use  Do not drink alcohol if: ? Your health care provider tells you not to drink. ? You are pregnant, may be pregnant, or are planning to become pregnant.  If you drink alcohol: ? Limit how much you use to 0-1 drink a day. ? Limit intake if you are breastfeeding.  Be aware of how much alcohol is in your drink. In the [place] one drink equals one 12 oz bottle of beer (355 mL), one 5 oz glass of wine (148 mL), or one 1 oz glass of hard liquor (44 mL). General instructions  Schedule regular health, dental, and eye exams.  Stay current with your vaccines.  Tell your health care provider if: ? You often feel depressed. ? You have ever been abused or do not feel safe at home. Summary  Adopting a healthy lifestyle and getting preventive care are  important in promoting health and wellness.  Follow your health care provider's instructions about healthy diet, exercising, and getting tested or screened for diseases.  Follow your health care provider's instructions on monitoring your cholesterol and blood pressure. This information is not intended to replace advice given to you by your health care provider. Make sure you discuss any questions you have with your health care provider. Document Revised: 03/22/2018 Document Reviewed: 03/22/2018 Elsevier Patient Education  2021 [employer].

## 2020-06-22 NOTE — Progress Notes (Signed)
Subjective:    Patient ID: Danielle Chase, female    DOB: February 24, 1961, 60 y.o.   MRN: [id number]   This visit occurred during the SARS-CoV-2 public health emergency.  Safety protocols were in place, including screening questions prior to the visit, additional usage of staff PPE, and extensive cleaning of exam room while observing appropriate contact time as indicated for disinfecting solutions.    HPI She is here for a physical exam.   She stopped the ozempic - it was expensive with her new insurance.  It did not help with weight.  She knows she needs to work on her weight.  Medications and allergies reviewed with patient and updated if appropriate.  Patient Active Problem List   Diagnosis Date Noted  . 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 LLQ pain 03/20/2018  . Onychomycosis 12/22/2016  . OSA (obstructive sleep apnea) 12/22/2016  . 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 Anxiety and depression 12/23/2015  . Morbid obesity ([place]) 09/26/2015  . 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 Carpal tunnel syndrome 06/05/2013  . Diabetes mellitus ([place]) 08/03/2011  . Hypertension 08/03/2011  . Hyperlipidemia 08/03/2011    Current Outpatient Medications on File Prior to Visit  Medication Sig Dispense Refill  . aspirin 81 MG tablet Take 81 mg by mouth daily.    . DULoxetine (CYMBALTA) 20 MG capsule TAKE 1 CAPSULE(20 MG) BY MOUTH DAILY 90 capsule 1  . fluticasone (FLONASE) 50 MCG/ACT nasal spray SHAKE LIQUID AND USE 2 SPRAYS IN EACH NOSTRIL DAILY AS NEEDED 48 g 1  . hydrochlorothiazide (HYDRODIURIL) 25 MG tablet TAKE 1 TABLET(25 MG) BY MOUTH DAILY 90 tablet 1  . losartan (COZAAR) 50 MG tablet TAKE 1 TABLET(50 MG) BY MOUTH DAILY 90 tablet 1  . metFORMIN (GLUCOPHAGE-XR) 500 MG 24 hr tablet TAKE 1 TABLET(500 MG) BY MOUTH DAILY WITH BREAKFAST 90 tablet 1  . Multiple Vitamins tablet TAKE 1 TABLET BY MOUTH DAILY 30 tablet 5  . propranolol ER (INDERAL LA)  60 MG 24 hr capsule TAKE 1 CAPSULE BY MOUTH EVERY DAY 90 capsule 1  . simvastatin (ZOCOR) 40 MG tablet TAKE 1/2 TABLET BY MOUTH EVERY NIGHT AT BEDTIME 45 tablet 1  . OZEMPIC, 1 MG/DOSE, 4 MG/3ML SOPN INJECT 1 MILLIGRAM INTO SKIN ONCE WEEK (Patient not taking: Reported on 06/24/2020) 3 mL 5  . Semaglutide, 1 MG/DOSE, (OZEMPIC, 1 MG/DOSE,) 2 MG/1.5ML SOPN Inject 1 mg into the skin once a week. (Patient not taking: Reported on 06/24/2020) 4 pen 5   No current facility-administered medications on file prior to visit.    Past Medical History:  Diagnosis Date  . Allergy   . Diabetes mellitus without complication ([place])   . Hyperlipidemia   . Hypertension     Past Surgical History:  Procedure Laterality Date  . CESAREAN SECTION     x1  . CHOLECYSTECTOMY    . TUBAL LIGATION      Social History   Socioeconomic History  . Marital status: Married    Spouse name: Not on file  . Number of children: Not on file  . Years of education: Not on file  . Highest education level: Not on file  Occupational History  . Not on file  Tobacco Use  . Smoking status: Former Smoker    Types: Cigarettes    Quit date: 05/01/2001    Years since quitting: 19.1  . Smokeless tobacco: Never Used  . Tobacco comment: quit  12 yrs ago  Substance and Sexual Activity  . Alcohol use: Yes    Alcohol/week: 0.0 standard drinks    Comment: socially  . Drug use: No  . Sexual activity: Yes    Partners: Male  Other Topics Concern  . Not on file  Social History Narrative  . Not on file   Social Determinants of Health   Financial Resource Strain: Not on file  Food Insecurity: Not on file  Transportation Needs: Not on file  Physical Activity: Not on file  Stress: Not on file  Social Connections: Not on file    Family History  Problem Relation Age of Onset  . COPD Mother   . Heart disease Father   . Breast cancer Sister   . Diabetes Brother   . Colon cancer Neg Hx   . Rectal cancer Neg Hx   . Stomach  cancer Neg Hx     Review of Systems  Constitutional: Negative for chills and fever.  Eyes: Negative for visual disturbance.  Respiratory: Negative for cough, shortness of breath and wheezing.   Cardiovascular: Positive for palpitations (occ, transient). Negative for chest pain and leg swelling.  Gastrointestinal: Negative for abdominal pain, blood in stool, constipation, diarrhea and nausea.       No gerd  Genitourinary: Negative for dysuria and hematuria.  Musculoskeletal: Negative for arthralgias and back pain.  Skin: Negative for color change and rash.  Neurological: Positive for light-headedness (occ - allergy related). Negative for numbness and headaches.  Psychiatric/Behavioral: Negative for dysphoric mood. The patient is not nervous/anxious.        Objective:   Vitals:   06/24/20 0833  BP: 130/72  Pulse: 74  Temp: 98.1 F (36.7 C)  SpO2: 97%   Filed Weights   06/24/20 0833  Weight: 265 lb (120.2 kg)   Body mass index is 45.49 kg/m.  BP Readings from Last 3 Encounters:  06/24/20 130/72  12/27/19 120/82  10/11/19 130/70    Wt Readings from Last 3 Encounters:  06/24/20 265 lb (120.2 kg)  12/27/19 268 lb 6.4 oz (121.7 kg)  10/11/19 270 lb 3.2 oz (122.6 kg)     Physical Exam Constitutional: She appears well-developed and well-nourished. No distress.  HENT:  Head: Normocephalic and atraumatic.  Right Ear: External ear normal. Normal ear canal and TM Left Ear: External ear normal.  Normal ear canal and TM Mouth/Throat: Oropharynx is clear and moist.  Eyes: Conjunctivae and EOM are normal.  Neck: Neck supple. No tracheal deviation present. No thyromegaly present.  No carotid bruit  Cardiovascular: Normal rate, regular rhythm and normal heart sounds.   No murmur heard.  No edema. Pulmonary/Chest: Effort normal and breath sounds normal. No respiratory distress. She has no wheezes. She has no rales.  Breast: deferred   Abdominal: Soft.  Obese.  She exhibits no  distension. There is no tenderness.  Lymphadenopathy: She has no cervical adenopathy.  Skin: Skin is warm and dry. She is not diaphoretic.  Psychiatric: She has a normal mood and affect. Her behavior is normal.        Assessment & Plan:   Physical exam: Screening blood work    ordered Immunizations  had covid booster, discussed shingrix Colonoscopy   Up to date  Mammogram    Up to date  Gyn  Up to date  Eye exams  Up to date  Exercise  Walking some-typically swims in the summer Weight  stessed weight loss - plans to work on [employer]  loss Substance abuse  none Sees derm annually     See Problem List for Assessment and Plan of chronic medical problems.

## 2020-06-24 ENCOUNTER — Encounter: Payer: Self-pay | Admitting: Internal Medicine

## 2020-06-24 ENCOUNTER — Other Ambulatory Visit: Payer: Self-pay

## 2020-06-24 ENCOUNTER — Ambulatory Visit (INDEPENDENT_AMBULATORY_CARE_PROVIDER_SITE_OTHER): Payer: 59 | Admitting: Internal Medicine

## 2020-06-24 VITALS — BP 130/72 | HR 74 | Temp 98.1°F | Ht 64.0 in | Wt 265.0 lb

## 2020-06-24 DIAGNOSIS — G4733 Obstructive sleep apnea (adult) (pediatric): Secondary | ICD-10-CM

## 2020-06-24 DIAGNOSIS — F419 Anxiety disorder, unspecified: Secondary | ICD-10-CM

## 2020-06-24 DIAGNOSIS — E782 Mixed hyperlipidemia: Secondary | ICD-10-CM

## 2020-06-24 DIAGNOSIS — Z0000 Encounter for general adult medical examination without abnormal findings: Secondary | ICD-10-CM | POA: Diagnosis not present

## 2020-06-24 DIAGNOSIS — E1169 Type 2 diabetes mellitus with other specified complication: Secondary | ICD-10-CM | POA: Diagnosis not present

## 2020-06-24 DIAGNOSIS — I10 Essential (primary) hypertension: Secondary | ICD-10-CM

## 2020-06-24 DIAGNOSIS — F32A Depression, unspecified: Secondary | ICD-10-CM

## 2020-06-24 LAB — COMPREHENSIVE METABOLIC PANEL
ALT: 24 U/L (ref 0–35)
AST: 21 U/L (ref 0–37)
Albumin: 4 g/dL (ref 3.5–5.2)
Alkaline Phosphatase: 51 U/L (ref 39–117)
BUN: 21 mg/dL (ref 6–23)
CO2: 33 mEq/L — ABNORMAL HIGH (ref 19–32)
Calcium: 9.6 mg/dL (ref 8.4–10.5)
Chloride: 100 mEq/L (ref 96–112)
Creatinine, Ser: 1.15 mg/dL (ref 0.40–1.20)
GFR: 51.84 mL/min — ABNORMAL LOW (ref >60.00)
Glucose, Bld: 128 mg/dL — ABNORMAL HIGH (ref 70–99)
Potassium: 4.1 mEq/L (ref 3.5–5.1)
Sodium: 140 mEq/L (ref 135–145)
Total Bilirubin: 0.9 mg/dL (ref 0.2–1.2)
Total Protein: 7.4 g/dL (ref 6.0–8.3)

## 2020-06-24 LAB — CBC WITH DIFFERENTIAL/PLATELET
Basophils Absolute: 0.1 10*3/uL (ref 0.0–0.1)
Basophils Relative: 1 % (ref 0.0–3.0)
Eosinophils Absolute: 0.3 10*3/uL (ref 0.0–0.7)
Eosinophils Relative: 3.6 % (ref 0.0–5.0)
HCT: 42.7 % (ref 36.0–46.0)
Hemoglobin: 14.9 g/dL (ref 12.0–15.0)
Lymphocytes Relative: 20.1 % (ref 12.0–46.0)
Lymphs Abs: 1.4 10*3/uL (ref 0.7–4.0)
MCHC: 34.9 g/dL (ref 30.0–36.0)
MCV: 93.4 fl (ref 78.0–100.0)
Monocytes Absolute: 0.9 10*3/uL (ref 0.1–1.0)
Monocytes Relative: 13.7 % — ABNORMAL HIGH (ref 3.0–12.0)
Neutro Abs: 4.3 10*3/uL (ref 1.4–7.7)
Neutrophils Relative %: 61.6 % (ref 43.0–77.0)
Platelets: 202 10*3/uL (ref 150.0–400.0)
RBC: 4.57 Mil/uL (ref 3.87–5.11)
RDW: 12.9 % (ref 11.5–15.5)
WBC: 6.9 10*3/uL (ref 4.0–10.5)

## 2020-06-24 LAB — LIPID PANEL
Cholesterol: 128 mg/dL (ref 0–200)
HDL: 41.3 mg/dL (ref >39.00)
LDL Cholesterol: 59 mg/dL (ref 0–99)
NonHDL: 86.65
Total CHOL/HDL Ratio: 3
Triglycerides: 136 mg/dL (ref 0.0–149.0)
VLDL: 27.2 mg/dL (ref 0.0–40.0)

## 2020-06-24 LAB — HEMOGLOBIN A1C: Hgb A1c MFr Bld: 5.8 % (ref 4.6–6.5)

## 2020-06-24 LAB — TSH: TSH: 2.56 u[IU]/mL (ref 0.35–4.50)

## 2020-06-24 NOTE — Assessment & Plan Note (Signed)
Chronic She knows she needs to work on her weight We discussed exercise regularly-she typically swims in the summer and walks-she will try to start doing this more consistently Discussed changes in her diet-decreasing carbs, sugars and decreasing portions Discussed possible referral to healthy weight and wellness clinic

## 2020-06-24 NOTE — Assessment & Plan Note (Signed)
Chronic Controlled Continue Metformin 500 mg daily with breakfast Ozempic is too expensive and she did not think it helped any with weight loss so agree with not pursuing prior authorization She will try to start regular exercise She plans on working on weight loss

## 2020-06-24 NOTE — Assessment & Plan Note (Signed)
Chronic Controlled, stable Continue Cymbalta 20 mg 3 times a week-this seems to be working for her so there is no need to change

## 2020-06-24 NOTE — Assessment & Plan Note (Signed)
Chronic Compliant with CPAP nightly 

## 2020-06-24 NOTE — Assessment & Plan Note (Signed)
Chronic Check lipid panel  Continue simvastatin 20 mg daily Regular exercise and healthy diet encouraged  

## 2020-06-24 NOTE — Assessment & Plan Note (Signed)
Chronic BP well controlled Continue hydrochlorothiazide 25 mg daily, losartan 50 mg daily, propranolol 60 mg daily cmp

## 2020-06-28 ENCOUNTER — Other Ambulatory Visit: Payer: Self-pay | Admitting: Internal Medicine

## 2020-06-30 LAB — HM DIABETES EYE EXAM

## 2020-07-10 ENCOUNTER — Encounter: Payer: Self-pay | Admitting: Internal Medicine

## 2020-07-10 NOTE — Progress Notes (Signed)
Outside notes received. Information abstracted. Notes sent to scan.  

## 2020-07-29 ENCOUNTER — Other Ambulatory Visit: Payer: Self-pay

## 2020-07-29 ENCOUNTER — Encounter: Payer: Self-pay | Admitting: Internal Medicine

## 2020-07-29 MED ORDER — PROPRANOLOL HCL ER 60 MG PO CP24: ORAL_CAPSULE | ORAL | 1 refills | Status: DC

## 2020-07-29 MED ORDER — HYDROCHLOROTHIAZIDE 25 MG PO TABS: ORAL_TABLET | ORAL | 1 refills | Status: DC

## 2020-07-29 MED ORDER — FLUTICASONE PROPIONATE 50 MCG/ACT NA SUSP: NASAL | 1 refills | Status: DC

## 2020-07-29 MED ORDER — METFORMIN HCL ER 500 MG PO TB24: ORAL_TABLET | ORAL | 1 refills | Status: DC

## 2020-09-22 ENCOUNTER — Encounter: Payer: Self-pay | Admitting: Internal Medicine

## 2020-09-23 ENCOUNTER — Other Ambulatory Visit: Payer: Self-pay

## 2020-09-23 MED ORDER — LOSARTAN POTASSIUM 50 MG PO TABS: ORAL_TABLET | ORAL | 1 refills | Status: DC

## 2020-09-23 MED ORDER — SIMVASTATIN 40 MG PO TABS: ORAL_TABLET | ORAL | 1 refills | Status: DC

## 2020-10-10 ENCOUNTER — Ambulatory Visit (INDEPENDENT_AMBULATORY_CARE_PROVIDER_SITE_OTHER): Payer: 59 | Admitting: Primary Care

## 2020-10-10 ENCOUNTER — Other Ambulatory Visit: Payer: Self-pay

## 2020-10-10 ENCOUNTER — Encounter: Payer: Self-pay | Admitting: Primary Care

## 2020-10-10 VITALS — BP 120/78 | HR 73 | Temp 98.3°F | Ht 63.0 in | Wt 268.2 lb

## 2020-10-10 DIAGNOSIS — G4733 Obstructive sleep apnea (adult) (pediatric): Secondary | ICD-10-CM | POA: Diagnosis not present

## 2020-10-10 NOTE — Assessment & Plan Note (Signed)
-   Patient is 90% compliant with CPAP  > 4 hours and reports benefit from use - Pressure 5-15cm h20; residual AHI 1.1  - No changes today - Renew CPAP supplies with [facility] - Continue to encourage weight loss efforts - FU in 1 year with Dr. Elsworth Soho or Eustaquio Maize NP

## 2020-10-10 NOTE — Progress Notes (Signed)
@Patient  ID: Danielle Chase, female    DOB: 1960/07/12, 60 y.o.   MRN: [id number]  Chief Complaint  Patient presents with   Follow-up    Referring provider: Binnie Rail, MD  HPI: 60 year old female, former smoker quit in 2003. PMH OSA, HTN, diabetes mellitus. Patient of Dr. Elsworth Soho, last seen by NP 09/21/18. HST in 2018 showed severe obstructive sleep apnea with an AHI of 48/hr. Maintained on CPAP auto titrate 5-15cm H20.   10/11/2019 Patient presents today for annual follow-up for OSA.  She is doing well with no acute complaints. She is compliant with CPAP use. No issues with mask fit or pressure settings. States that she has no trouble falling asleep or staying asleep.  She will wake up around 4 AM because of her cat needing to be fed.  She needs a prescription for new supplies.  She uses nasal pillow mask.  DME is [facility].  Epworth score today 3/24.  Review download 09/10/2019-10/09/2019 Usage 28/30 days (93%) Average hours used 5 hours 25 minutes Pressure setting 5-15 cm H2O (11.9cm H2O-95%) Air leaks 1.8 L/min AHI 0.9   10/10/2020- Interim hx  Patient presents today for 1 year follow-up OSA.  Patient is doing well without acute complaints.  She is compliant with CPAP use.  She reports no issues with mask fit or pressure settings. She reports benefit from use, she reports not sleeping well without it. She uses nasal pillow mask, occasionally has trouble with it moving around at night. She has tried a contour CPAP pillow but states that it is difficult to sleep on. Epworth score 2. DME company is [employer].   [facility] download 09/09/20-10/08/20 29/30 days; 27 days (90%) > 4 hours Average usage 6 hours 52 mins Pressure 5-15cm h20 (11.4cm h20-95%) Airleaks 0.9L/min AHI 1.1    No Known Allergies  Immunization History  Administered Date(s) Administered   Influenza Split 01/05/2012   Influenza,inj,Quad PF,6+ Mos 12/26/2012, 01/15/2014, 12/23/2015, 12/22/2016, 12/22/2017, 12/27/2018,  12/27/2019   Influenza-Unspecified 01/11/2015   PFIZER(Purple Top)SARS-COV-2 Vaccination 07/02/2019, 07/23/2019   Tdap 06/27/2007, 12/22/2017    Past Medical History:  Diagnosis Date   Allergy    Diabetes mellitus without complication ([place])    Hyperlipidemia    Hypertension     Tobacco History: Social History   Tobacco Use  Smoking Status Former   Pack years: 0.00   Types: Cigarettes   Quit date: 05/01/2001   Years since quitting: 19.4  Smokeless Tobacco Never  Tobacco Comments   quit 12 yrs ago   Counseling given: Not Answered Tobacco comments: quit 12 yrs ago   Outpatient Medications Prior to Visit  Medication Sig Dispense Refill   aspirin 81 MG tablet Take 81 mg by mouth daily.     DULoxetine (CYMBALTA) 20 MG capsule TAKE 1 CAPSULE(20 MG) BY MOUTH DAILY 90 capsule 1   fluticasone (FLONASE) 50 MCG/ACT nasal spray SHAKE LIQUID AND USE 2 SPRAYS IN EACH NOSTRIL DAILY AS NEEDED 48 g 1   hydrochlorothiazide (HYDRODIURIL) 25 MG tablet TAKE 1 TABLET(25 MG) BY MOUTH DAILY 90 tablet 1   losartan (COZAAR) 50 MG tablet TAKE 1 TABLET(50 MG) BY MOUTH DAILY 90 tablet 1   metFORMIN (GLUCOPHAGE-XR) 500 MG 24 hr tablet TAKE 1 TABLET(500 MG) BY MOUTH DAILY WITH BREAKFAST 90 tablet 1   Multiple Vitamins tablet TAKE 1 TABLET BY MOUTH DAILY 30 tablet 5   propranolol ER (INDERAL LA) 60 MG 24 hr capsule TAKE 1 CAPSULE BY MOUTH EVERY DAY  90 capsule 1   simvastatin (ZOCOR) 40 MG tablet TAKE 1/2 TABLET BY MOUTH EVERY NIGHT AT BEDTIME 45 tablet 1   No facility-administered medications prior to visit.    Review of Systems  Review of Systems  Constitutional: Negative.   HENT: Negative.    Respiratory: Negative.    Psychiatric/Behavioral:  Negative for sleep disturbance.     Physical Exam  BP 120/78 (BP Location: Left Arm, Patient Position: Sitting, Cuff Size: Large)   Pulse 73   Temp 98.3 F (36.8 C) (Oral)   Ht 5\' 3"  (1.6 m)   Wt 268 lb 3.2 oz (121.7 kg)   SpO2 99%   BMI 47.51  kg/m  Physical Exam Constitutional:      Appearance: Normal appearance.  HENT:     Mouth/Throat:     Mouth: Mucous membranes are moist.     Pharynx: Oropharynx is clear.  Cardiovascular:     Rate and Rhythm: Normal rate and regular rhythm.  Pulmonary:     Effort: Pulmonary effort is normal.     Breath sounds: Normal breath sounds.  Skin:    General: Skin is warm and dry.  Neurological:     General: No focal deficit present.     Mental Status: She is alert and oriented to person, place, and time. Mental status is at baseline.  Psychiatric:        Mood and Affect: Mood normal.        Behavior: Behavior normal.        Thought Content: Thought content normal.        Judgment: Judgment normal.     Lab Results:  CBC    Component Value Date/Time   WBC 6.9 06/24/2020 0911   RBC 4.57 06/24/2020 0911   HGB 14.9 06/24/2020 0911   HCT 42.7 06/24/2020 0911   PLT 202.0 06/24/2020 0911   MCV 93.4 06/24/2020 0911   MCV 97.7 (A) 01/29/2014 1546   MCH 33.0 12/27/2019 0903   MCHC 34.9 06/24/2020 0911   RDW 12.9 06/24/2020 0911   LYMPHSABS 1.4 06/24/2020 0911   MONOABS 0.9 06/24/2020 0911   EOSABS 0.3 06/24/2020 0911   BASOSABS 0.1 06/24/2020 0911    BMET    Component Value Date/Time   NA 140 06/24/2020 0911   K 4.1 06/24/2020 0911   CL 100 06/24/2020 0911   CO2 33 (H) 06/24/2020 0911   GLUCOSE 128 (H) 06/24/2020 0911   BUN 21 06/24/2020 0911   CREATININE 1.15 06/24/2020 0911   CREATININE 1.08 (H) 12/27/2019 0903   CALCIUM 9.6 06/24/2020 0911   GFRNONAA 56 (L) 12/27/2019 0903   GFRAA 65 12/27/2019 0903    BNP No results found for: BNP  ProBNP No results found for: PROBNP  Imaging: No results found.   Assessment & Plan:   No problem-specific Assessment & Plan notes found for this encounter.     Martyn Ehrich, NP 10/10/2020

## 2020-10-10 NOTE — Patient Instructions (Signed)
Nice seeing you today Ms. Danielle Chase job wearing your CPAP, no changes recommended today  Recommendations Continue to wear CPAP every night for 4 to 6 hours or longer Do not drive if experiencing excessive daytime fatigue or somnolence Continue to work on weight loss efforts  Follow-up 1 year overview with Dr. Elsworth Soho or Eustaquio Maize NP

## 2020-12-25 ENCOUNTER — Other Ambulatory Visit: Payer: Self-pay

## 2020-12-25 NOTE — Patient Instructions (Addendum)
  Blood work was ordered.     Flu immunization administered today.     Medications changes include :  none    Please followup in 6 months

## 2020-12-25 NOTE — Progress Notes (Addendum)
Subjective:    Patient ID: Danielle Chase, female    DOB: 1960-09-13, 60 y.o.   MRN: [id number]  This visit occurred during the SARS-CoV-2 public health emergency.  Safety protocols were in place, including screening questions prior to the visit, additional usage of staff PPE, and extensive cleaning of exam room while observing appropriate contact time as indicated for disinfecting solutions.     HPI The patient is here for follow up of their chronic medical problems, including htn, DM, hyperlipidemia, depression, anxiety, OSA, obesity   For one week she has not taken the cymbalta - she is going to see if she can go without it.    Last week started inc urinary freq and dysuria.  Taking azo and it is helping  She is doing a lot of exercise and has been consistent with that.  She does not eat much.  She has not lost weight - weight is up a little - ? Muscle - she is frustrated.  Medications and allergies reviewed with patient and updated if appropriate.  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     DULoxetine (CYMBALTA) 20 MG capsule TAKE 1 CAPSULE(20 MG) BY MOUTH DAILY 90 capsule 1   fluticasone (FLONASE) 50 MCG/ACT nasal spray SHAKE LIQUID AND USE 2 SPRAYS IN EACH NOSTRIL DAILY AS NEEDED 48 g 1   hydrochlorothiazide (HYDRODIURIL) 25 MG tablet TAKE 1 TABLET(25  MG) BY MOUTH DAILY 90 tablet 1   losartan (COZAAR) 50 MG tablet TAKE 1 TABLET(50 MG) BY MOUTH DAILY 90 tablet 1   metFORMIN (GLUCOPHAGE-XR) 500 MG 24 hr tablet TAKE 1 TABLET(500 MG) BY MOUTH DAILY WITH BREAKFAST 90 tablet 1   Multiple Vitamins tablet TAKE 1 TABLET BY MOUTH DAILY 30 tablet 5   propranolol ER (INDERAL LA) 60 MG 24 hr capsule TAKE 1 CAPSULE BY MOUTH EVERY DAY 90 capsule 1   simvastatin (ZOCOR) 40 MG tablet TAKE 1/2 TABLET BY MOUTH EVERY NIGHT AT BEDTIME 45 tablet 1   No current facility-administered medications on file prior to visit.    Past Medical History:  Diagnosis Date   Allergy    Diabetes mellitus without complication ([place])    Hyperlipidemia    Hypertension     Past Surgical History:  Procedure Laterality Date   CESAREAN SECTION     x1   CHOLECYSTECTOMY     TUBAL LIGATION      Social History   Socioeconomic History   Marital status: Married    Spouse name: Not on file   Number of children: Not on file   Years of education: Not on file   Highest education level: Not on file  Occupational History   Not on file  Tobacco Use   Smoking status: Former    Types: Cigarettes    Quit date: 05/01/2001  Years since quitting: 19.6   Smokeless tobacco: Never   Tobacco comments:    quit 12 yrs ago  Substance and Sexual Activity   Alcohol use: Yes    Alcohol/week: 0.0 standard drinks    Comment: socially   Drug use: No   Sexual activity: Yes    Partners: Male  Other Topics Concern   Not on file  Social History Narrative   Not on file   Social Determinants of Health   Financial Resource Strain: Not on file  Food Insecurity: Not on file  Transportation Needs: Not on file  Physical Activity: Not on file  Stress: Not on file  Social Connections: Not on file    Family History  Problem Relation Age of Onset   COPD Mother    Heart disease Father    Breast cancer Sister    Diabetes Brother    Colon cancer Neg Hx    Rectal cancer Neg Hx     Stomach cancer Neg Hx     Review of Systems  Constitutional:  Negative for chills and fever.  Respiratory:  Negative for cough, shortness of breath and wheezing.   Cardiovascular:  Positive for palpitations (occ). Negative for chest pain and leg swelling.  Genitourinary:  Positive for dysuria and frequency. Negative for hematuria.  Neurological:  Negative for light-headedness and headaches.      Objective:   Vitals:   12/26/20 0752  BP: 122/78  Pulse: 80  Temp: 98.1 F (36.7 C)  SpO2: 99%   BP Readings from Last 3 Encounters:  12/26/20 122/78  10/10/20 120/78  06/24/20 130/72   Wt Readings from Last 3 Encounters:  12/26/20 271 lb (122.9 kg)  10/10/20 268 lb 3.2 oz (121.7 kg)  06/24/20 265 lb (120.2 kg)   Body mass index is 48.01 kg/m.   Depression screen [facility] 2/9 12/26/2020 06/29/2019 12/27/2018 12/22/2016 07/03/2015  Decreased Interest 0 0 0 0 0  Down, Depressed, Hopeless 0 0 0 0 0  PHQ - 2 Score 0 0 0 0 0  Altered sleeping '1 1 1 '$ - -  Tired, decreased energy '2 2 1 '$ - -  Change in appetite 0 0 1 - -  Feeling bad or failure about yourself  0 0 0 - -  Trouble concentrating 0 1 1 - -  Moving slowly or fidgety/restless 0 0 0 - -  Suicidal thoughts 0 0 0 - -  PHQ-9 Score '3 4 4 '$ - -  Difficult doing work/chores Not difficult at all - - - -   GAD 7 : Generalized Anxiety Score 12/26/2020 06/29/2019 12/27/2018  Nervous, Anxious, on Edge - 0 1  Control/stop worrying 0 0 0  Worry too much - different things 0 0 0  Trouble relaxing 0 0 0  Restless 0 0 0  Easily annoyed or irritable 0 1 1  Afraid - awful might happen 0 0 0  Total GAD 7 Score - 1 2       Physical Exam    Constitutional: Appears well-developed and well-nourished. No distress.  HENT:  Head: Normocephalic and atraumatic.  Neck: Neck supple. No tracheal deviation present. No thyromegaly present.  No cervical lymphadenopathy Cardiovascular: Normal rate, regular rhythm and normal heart sounds.   No murmur heard.  No carotid bruit .  No edema Pulmonary/Chest: Effort normal and breath sounds normal. No respiratory distress. No has no wheezes. No rales.  Skin: Skin is warm and dry. Not diaphoretic.  Psychiatric: Normal mood and affect.  Behavior is normal.      Assessment & Plan:     Screened for depression using the PHQ 9 scale.  No evidence of depression.   Screened for anxiety using GAD7 Scale.  No evidence of anxiety.   See Problem List for Assessment and Plan of chronic medical problems.

## 2020-12-26 ENCOUNTER — Ambulatory Visit (INDEPENDENT_AMBULATORY_CARE_PROVIDER_SITE_OTHER): Payer: 59 | Admitting: Internal Medicine

## 2020-12-26 ENCOUNTER — Encounter: Payer: Self-pay | Admitting: Internal Medicine

## 2020-12-26 VITALS — BP 122/78 | HR 80 | Temp 98.1°F | Ht 63.0 in | Wt 271.0 lb

## 2020-12-26 DIAGNOSIS — I10 Essential (primary) hypertension: Secondary | ICD-10-CM

## 2020-12-26 DIAGNOSIS — Z1331 Encounter for screening for depression: Secondary | ICD-10-CM

## 2020-12-26 DIAGNOSIS — G4733 Obstructive sleep apnea (adult) (pediatric): Secondary | ICD-10-CM | POA: Diagnosis not present

## 2020-12-26 DIAGNOSIS — F32A Depression, unspecified: Secondary | ICD-10-CM

## 2020-12-26 DIAGNOSIS — R350 Frequency of micturition: Secondary | ICD-10-CM | POA: Insufficient documentation

## 2020-12-26 DIAGNOSIS — Z23 Encounter for immunization: Secondary | ICD-10-CM | POA: Diagnosis not present

## 2020-12-26 DIAGNOSIS — E782 Mixed hyperlipidemia: Secondary | ICD-10-CM

## 2020-12-26 DIAGNOSIS — E1169 Type 2 diabetes mellitus with other specified complication: Secondary | ICD-10-CM

## 2020-12-26 DIAGNOSIS — F419 Anxiety disorder, unspecified: Secondary | ICD-10-CM

## 2020-12-26 LAB — COMPREHENSIVE METABOLIC PANEL
ALT: 20 U/L (ref 0–35)
AST: 17 U/L (ref 0–37)
Albumin: 3.9 g/dL (ref 3.5–5.2)
Alkaline Phosphatase: 51 U/L (ref 39–117)
BUN: 14 mg/dL (ref 6–23)
CO2: 31 mEq/L (ref 19–32)
Calcium: 9.2 mg/dL (ref 8.4–10.5)
Chloride: 100 mEq/L (ref 96–112)
Creatinine, Ser: 1.08 mg/dL (ref 0.40–1.20)
GFR: 55.7 mL/min — ABNORMAL LOW (ref >60.00)
Glucose, Bld: 174 mg/dL — ABNORMAL HIGH (ref 70–99)
Potassium: 3.7 mEq/L (ref 3.5–5.1)
Sodium: 139 mEq/L (ref 135–145)
Total Bilirubin: 1 mg/dL (ref 0.2–1.2)
Total Protein: 7 g/dL (ref 6.0–8.3)

## 2020-12-26 LAB — URINALYSIS, ROUTINE W REFLEX MICROSCOPIC
Bilirubin Urine: NEGATIVE
Hgb urine dipstick: NEGATIVE
Ketones, ur: NEGATIVE
Nitrite: NEGATIVE
RBC / HPF: NONE SEEN (ref 0–?)
Specific Gravity, Urine: <=1.005 — AB (ref 1.000–1.030)
Total Protein, Urine: NEGATIVE
Urine Glucose: NEGATIVE
Urobilinogen, UA: 0.2 (ref 0.0–1.0)
pH: 6 (ref 5.0–8.0)

## 2020-12-26 LAB — HEMOGLOBIN A1C: Hgb A1c MFr Bld: 7.5 % — ABNORMAL HIGH (ref 4.6–6.5)

## 2020-12-26 NOTE — Assessment & Plan Note (Signed)
Chronic Check lipid panel  Continue simvastatin 40 mg qd Regular exercise and healthy diet encouraged

## 2020-12-26 NOTE — Addendum Note (Signed)
Addended by: Boris Lown B on: 12/26/2020 08:31 AM   Modules accepted: Orders

## 2020-12-26 NOTE — Assessment & Plan Note (Signed)
Chronic Has been exercising regularly, but not losing weight Could try trulicity is she wants Advised to calorie count for a few days - may be consuming more than she realizes - goal about 1400 cal

## 2020-12-26 NOTE — Assessment & Plan Note (Signed)
Acute Symptoms started last week Increase frequency, dysuria AZO is helping UA, Ucx

## 2020-12-26 NOTE — Assessment & Plan Note (Signed)
Chronic BP well controlled Continue hctz 25 mg daily, losartan 50 mg daily, propranolol LA 60 mg qd cmp

## 2020-12-26 NOTE — Assessment & Plan Note (Addendum)
Chronic Lab Results  Component Value Date   HGBA1C 5.8 06/24/2020   Controlled continue metformin xr 500 mg qd Check a1c

## 2020-12-26 NOTE — Assessment & Plan Note (Addendum)
Chronic Controlled, stable Has been taking cymbalta 20 mg TIW but has not taken it in the past week and will see if she can go without it Monitor off medication

## 2020-12-26 NOTE — Addendum Note (Signed)
Addended by: Marcina Millard on: 12/26/2020 04:56 PM   Modules accepted: Orders

## 2020-12-28 ENCOUNTER — Encounter: Payer: Self-pay | Admitting: Internal Medicine

## 2020-12-28 LAB — URINE CULTURE

## 2020-12-28 MED ORDER — NITROFURANTOIN MONOHYD MACRO 100 MG PO CAPS: 100.0000 mg | ORAL_CAPSULE | Freq: Two times a day (BID) | ORAL | 0 refills | Status: DC

## 2020-12-28 NOTE — Addendum Note (Signed)
Addended by: Binnie Rail on: 12/28/2020 06:45 PM   Modules accepted: Orders

## 2021-01-01 MED ORDER — RYBELSUS 3 MG PO TABS: 3.0000 mg | ORAL_TABLET | Freq: Every day | ORAL | 2 refills | Status: DC

## 2021-01-01 NOTE — Addendum Note (Signed)
Addended by: Binnie Rail on: 01/01/2021 07:33 AM   Modules accepted: Orders

## 2021-01-04 ENCOUNTER — Encounter: Payer: Self-pay | Admitting: Internal Medicine

## 2021-01-06 MED ORDER — DULOXETINE HCL 20 MG PO CPEP
ORAL_CAPSULE | ORAL | 1 refills | Status: DC
Start: 2021-01-06 — End: 2022-02-24

## 2021-01-27 MED ORDER — RYBELSUS 7 MG PO TABS: 7.0000 mg | ORAL_TABLET | Freq: Every day | ORAL | 5 refills | Status: DC

## 2021-01-27 NOTE — Addendum Note (Signed)
Addended by: Binnie Rail on: 01/27/2021 04:38 PM   Modules accepted: Orders

## 2021-02-15 ENCOUNTER — Other Ambulatory Visit: Payer: Self-pay | Admitting: Internal Medicine

## 2021-02-24 ENCOUNTER — Encounter: Payer: Self-pay | Admitting: Internal Medicine

## 2021-02-25 MED ORDER — CEPHALEXIN 500 MG PO CAPS: 500.0000 mg | ORAL_CAPSULE | Freq: Two times a day (BID) | ORAL | 0 refills | Status: DC

## 2021-03-18 NOTE — Progress Notes (Signed)
Subjective:    Patient ID: Danielle Chase, female    DOB: [birth year], 60 y.o.   MRN: [id number]  This visit occurred during the SARS-CoV-2 public health emergency.  Safety protocols were in place, including screening questions prior to the visit, additional usage of staff PPE, and extensive cleaning of exam room while observing appropriate contact time as indicated for disinfecting solutions.    HPI The patient is here for an acute visit.   Left upper thigh tenderness/stinging - she has tenderness to the touch across her upper left anterior leg when she is sitting and leaning forward.  If she sits straight she does not feel it.  She denies N/T. She has no pain with walking or standing.  She noticed it about two weeks ago.  Her left leg feels weaker going up stairs.  If she is laying in bed and lifts her leg up and out it hurts. When she gets up from a chair she has a pulling sensation in the upper leg.    No obvious injury.  ? Strained it doing abductor muscle machine or treadmill.  She also does recall moving furniture recently and could have done something then.      Medications and allergies reviewed with patient and updated if appropriate.  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     DULoxetine (CYMBALTA) 20 MG capsule TAKE 1 CAPSULE(20 MG) BY MOUTH DAILY 90  capsule 1   fluticasone (FLONASE) 50 MCG/ACT nasal spray SHAKE LIQUID AND USE 2 SPRAYS IN EACH NOSTRIL DAILY AS NEEDED 48 g 1   hydrochlorothiazide (HYDRODIURIL) 25 MG tablet TAKE 1 TABLET BY MOUTH DAILY 90 tablet 1   losartan (COZAAR) 50 MG tablet TAKE 1 TABLET(50 MG) BY MOUTH DAILY 90 tablet 1   metFORMIN (GLUCOPHAGE-XR) 500 MG 24 hr tablet TAKE 1 TABLET(500 MG) BY MOUTH DAILY WITH BREAKFAST 90 tablet 1   Multiple Vitamins tablet TAKE 1 TABLET BY MOUTH DAILY 30 tablet 5   nitrofurantoin, macrocrystal-monohydrate, (MACROBID) 100 MG capsule Take 1 capsule (100 mg total) by mouth 2 (two) times daily. 14 capsule 0   propranolol ER (INDERAL LA) 60 MG 24 hr capsule TAKE 1 CAPSULE BY MOUTH EVERY DAY 90 capsule 1   Semaglutide (RYBELSUS) 7 MG TABS Take 7 mg by mouth daily. 30 tablet 5   simvastatin (ZOCOR) 40 MG tablet TAKE 1/2 TABLET BY MOUTH EVERY NIGHT AT BEDTIME 45 tablet 1   No current facility-administered medications on file prior to visit.    Past Medical History:  Diagnosis Date   Allergy    Diabetes mellitus without complication ([place])    Hyperlipidemia    Hypertension     Past Surgical History:  Procedure Laterality Date   CESAREAN SECTION     x1   CHOLECYSTECTOMY     TUBAL LIGATION  Social History   Socioeconomic History   Marital status: Married    Spouse name: Not on file   Number of children: Not on file   Years of education: Not on file   Highest education level: Not on file  Occupational History   Not on file  Tobacco Use   Smoking status: Former    Types: Cigarettes    Quit date: 05/01/2001    Years since quitting: 19.8   Smokeless tobacco: Never   Tobacco comments:    quit 12 yrs ago  Substance and Sexual Activity   Alcohol use: Yes    Alcohol/week: 0.0 standard drinks    Comment: socially   Drug use: No   Sexual activity: Yes    Partners: Male  Other Topics Concern   Not on file  Social History Narrative   Not on file   Social Determinants  of Health   Financial Resource Strain: Not on file  Food Insecurity: Not on file  Transportation Needs: Not on file  Physical Activity: Not on file  Stress: Not on file  Social Connections: Not on file    Family History  Problem Relation Age of Onset   COPD Mother    Heart disease Father    Breast cancer Sister    Diabetes Brother    Colon cancer Neg Hx    Rectal cancer Neg Hx    Stomach cancer Neg Hx     Review of Systems     Objective:   Vitals:   03/19/21 0751  BP: 114/72  Pulse: 80  Temp: 98 F (36.7 C)  SpO2: 97%   BP Readings from Last 3 Encounters:  03/19/21 114/72  12/26/20 122/78  10/10/20 120/78   Wt Readings from Last 3 Encounters:  03/19/21 265 lb (120.2 kg)  12/26/20 271 lb (122.9 kg)  10/10/20 268 lb 3.2 oz (121.7 kg)   Body mass index is 46.94 kg/m.   Physical Exam Constitutional:      General: She is not in acute distress.    Appearance: Normal appearance. She is not ill-appearing.  HENT:     Head: Normocephalic and atraumatic.  Musculoskeletal:     Right lower leg: No edema.     Left lower leg: No edema.     Comments: No spinal tenderness or lower back tenderness..  No trochanter tenderness.  No left groin tenderness.  May be slight tenderness with palpation of upper-mid left anterior thigh in a diagonal pattern from lateral to medial aspect  Skin:    General: Skin is warm and dry.  Neurological:     Mental Status: She is alert.     Sensory: No sensory deficit.     Motor: No weakness.           Assessment & Plan:    See Problem List for Assessment and Plan of chronic medical problems.

## 2021-03-19 ENCOUNTER — Other Ambulatory Visit: Payer: Self-pay

## 2021-03-19 ENCOUNTER — Encounter: Payer: Self-pay | Admitting: Internal Medicine

## 2021-03-19 ENCOUNTER — Ambulatory Visit (INDEPENDENT_AMBULATORY_CARE_PROVIDER_SITE_OTHER): Payer: 59 | Admitting: Internal Medicine

## 2021-03-19 DIAGNOSIS — M79605 Pain in left leg: Secondary | ICD-10-CM | POA: Insufficient documentation

## 2021-03-19 DIAGNOSIS — I10 Essential (primary) hypertension: Secondary | ICD-10-CM

## 2021-03-19 NOTE — Patient Instructions (Signed)
    You likely have a pinched nerve.   You can try heat.   Try taking ibuprofen or aleve daily for a week to see if that helps    If the symptoms do not improve I can refer you to sports medicine.

## 2021-03-19 NOTE — Assessment & Plan Note (Signed)
Acute 2 weeks of left upper-lateral tenderness or discomfort most prominent with going up stairs or sitting and leaning forward at the same time-does not notice it much when laying, walking or sitting back in a straight position Possible radiculopathy, pinched cutaneous nerve, muscle strain Unlikely hip osteoarthritis Symptoms are mild Advised to avoid any heavy lifting, bending/twisting Advised taking over-the-counter ibuprofen for a week or so to see if that helps Can apply heat If there is no improvement she will let me know and I can refer to sports medicine

## 2021-03-19 NOTE — Assessment & Plan Note (Signed)
Chronic Blood pressure well controlled Continue HCTZ 25 mg daily, losartan 50 mg daily, propranolol LA 60 mg daily  Advised she can take ibuprofen short for a short period of time-blood pressure well controlled so I do not think this will influence her blood pressure.  Can monitor BP at home

## 2021-03-23 ENCOUNTER — Other Ambulatory Visit: Payer: Self-pay | Admitting: Internal Medicine

## 2021-03-24 ENCOUNTER — Other Ambulatory Visit: Payer: Self-pay | Admitting: Internal Medicine

## 2021-04-23 ENCOUNTER — Telehealth: Payer: 59 | Admitting: Physician Assistant

## 2021-04-23 DIAGNOSIS — J019 Acute sinusitis, unspecified: Secondary | ICD-10-CM | POA: Diagnosis not present

## 2021-04-23 DIAGNOSIS — B9789 Other viral agents as the cause of diseases classified elsewhere: Secondary | ICD-10-CM

## 2021-04-23 MED ORDER — IPRATROPIUM BROMIDE 0.03 % NA SOLN: 2.0000 | Freq: Two times a day (BID) | NASAL | 0 refills | Status: DC

## 2021-04-23 NOTE — Progress Notes (Signed)

## 2021-04-27 ENCOUNTER — Other Ambulatory Visit: Payer: Self-pay | Admitting: Internal Medicine

## 2021-05-04 ENCOUNTER — Other Ambulatory Visit: Payer: Self-pay | Admitting: Internal Medicine

## 2021-05-06 NOTE — Telephone Encounter (Signed)
Danielle Chase (Key: [id number])  PA started today.  Waiting for decision to come back

## 2021-05-14 MED ORDER — TRULICITY 0.75 MG/0.5ML ~~LOC~~ SOAJ: 0.7500 mg | SUBCUTANEOUS | 0 refills | Status: DC

## 2021-05-16 ENCOUNTER — Other Ambulatory Visit: Payer: Self-pay | Admitting: Physician Assistant

## 2021-05-16 DIAGNOSIS — B9789 Other viral agents as the cause of diseases classified elsewhere: Secondary | ICD-10-CM

## 2021-06-02 MED ORDER — TRULICITY 1.5 MG/0.5ML ~~LOC~~ SOAJ: 1.5000 mg | SUBCUTANEOUS | 0 refills | Status: DC

## 2021-06-02 NOTE — Addendum Note (Signed)
Addended by: Binnie Rail on: 06/02/2021 08:56 PM   Modules accepted: Orders

## 2021-06-08 DIAGNOSIS — Z85828 Personal history of other malignant neoplasm of skin: Secondary | ICD-10-CM | POA: Diagnosis not present

## 2021-06-08 DIAGNOSIS — C44319 Basal cell carcinoma of skin of other parts of face: Secondary | ICD-10-CM | POA: Diagnosis not present

## 2021-06-25 ENCOUNTER — Encounter: Payer: Self-pay | Admitting: Internal Medicine

## 2021-06-25 NOTE — Patient Instructions (Addendum)
? ? ? ?Blood work was ordered.   ? ? ?Medications changes include :   increase trulicity to 3 mg weekly ? ? ?Your prescription(s) have been sent to your pharmacy.  ? ? ? ?Return in about 6 months (around 12/27/2021) for follow up. ? ? ?Health Maintenance, Female ?Adopting a healthy lifestyle and getting preventive care are important in promoting health and wellness. Ask your health care provider about: ?The right schedule for you to have regular tests and exams. ?Things you can do on your own to prevent diseases and keep yourself healthy. ?What should I know about diet, weight, and exercise? ?Eat a healthy diet ? ?Eat a diet that includes plenty of vegetables, fruits, low-fat dairy products, and lean protein. ?Do not eat a lot of foods that are high in solid fats, added sugars, or sodium. ?Maintain a healthy weight ?Body mass index (BMI) is used to identify weight problems. It estimates body fat based on height and weight. Your health care provider can help determine your BMI and help you achieve or maintain a healthy weight. ?Get regular exercise ?Get regular exercise. This is one of the most important things you can do for your health. Most adults should: ?Exercise for at least 150 minutes each week. The exercise should increase your heart rate and make you sweat (moderate-intensity exercise). ?Do strengthening exercises at least twice a week. This is in addition to the moderate-intensity exercise. ?Spend less time sitting. Even light physical activity can be beneficial. ?Watch cholesterol and blood lipids ?Have your blood tested for lipids and cholesterol at 62 years of age, then have this test every 5 years. ?Have your cholesterol levels checked more often if: ?Your lipid or cholesterol levels are high. ?You are older than 62 years of age. ?You are at high risk for heart disease. ?What should I know about cancer screening? ?Depending on your health history and family history, you may need to have cancer  screening at various ages. This may include screening for: ?Breast cancer. ?Cervical cancer. ?Colorectal cancer. ?Skin cancer. ?Lung cancer. ?What should I know about heart disease, diabetes, and high blood pressure? ?Blood pressure and heart disease ?High blood pressure causes heart disease and increases the risk of stroke. This is more likely to develop in people who have high blood pressure readings or are overweight. ?Have your blood pressure checked: ?Every 3-5 years if you are 42-9 years of age. ?Every year if you are 50 years old or older. ?Diabetes ?Have regular diabetes screenings. This checks your fasting blood sugar level. Have the screening done: ?Once every three years after age 25 if you are at a normal weight and have a low risk for diabetes. ?More often and at a younger age if you are overweight or have a high risk for diabetes. ?What should I know about preventing infection? ?Hepatitis B ?If you have a higher risk for hepatitis B, you should be screened for this virus. Talk with your health care provider to find out if you are at risk for hepatitis B infection. ?Hepatitis C ?Testing is recommended for: ?Everyone born from [birth year] through 1966. ?Anyone with known risk factors for hepatitis C. ?Sexually transmitted infections (STIs) ?Get screened for STIs, including gonorrhea and chlamydia, if: ?You are sexually active and are younger than 62 years of age. ?You are older than 62 years of age and your health care provider tells you that you are at risk for this type of infection. ?Your sexual activity has changed since you were  last screened, and you are at increased risk for chlamydia or gonorrhea. Ask your health care provider if you are at risk. ?Ask your health care provider about whether you are at high risk for HIV. Your health care provider may recommend a prescription medicine to help prevent HIV infection. If you choose to take medicine to prevent HIV, you should first get tested for HIV. You  should then be tested every 3 months for as long as you are taking the medicine. ?Pregnancy ?If you are about to stop having your period (premenopausal) and you may become pregnant, seek counseling before you get pregnant. ?Take 400 to 800 micrograms (mcg) of folic acid every day if you become pregnant. ?Ask for birth control (contraception) if you want to prevent pregnancy. ?Osteoporosis and menopause ?Osteoporosis is a disease in which the bones lose minerals and strength with aging. This can result in bone fractures. If you are 28 years old or older, or if you are at risk for osteoporosis and fractures, ask your health care provider if you should: ?Be screened for bone loss. ?Take a calcium or vitamin D supplement to lower your risk of fractures. ?Be given hormone replacement therapy (HRT) to treat symptoms of menopause. ?Follow these instructions at home: ?Alcohol use ?Do not drink alcohol if: ?Your health care provider tells you not to drink. ?You are pregnant, may be pregnant, or are planning to become pregnant. ?If you drink alcohol: ?Limit how much you have to: ?0-1 drink a day. ?Know how much alcohol is in your drink. In the [place] one drink equals one 12 oz bottle of beer (355 mL), one 5 oz glass of wine (148 mL), or one 1? oz glass of hard liquor (44 mL). ?Lifestyle ?Do not use any products that contain nicotine or tobacco. These products include cigarettes, chewing tobacco, and vaping devices, such as e-cigarettes. If you need help quitting, ask your health care provider. ?Do not use street drugs. ?Do not share needles. ?Ask your health care provider for help if you need support or information about quitting drugs. ?General instructions ?Schedule regular health, dental, and eye exams. ?Stay current with your vaccines. ?Tell your health care provider if: ?You often feel depressed. ?You have ever been abused or do not feel safe at home. ?Summary ?Adopting a healthy lifestyle and getting preventive care are  important in promoting health and wellness. ?Follow your health care provider's instructions about healthy diet, exercising, and getting tested or screened for diseases. ?Follow your health care provider's instructions on monitoring your cholesterol and blood pressure. ?This information is not intended to replace advice given to you by your health care provider. Make sure you discuss any questions you have with your health care provider. ?Document Revised: 08/18/2020 Document Reviewed: 08/18/2020 ?Elsevier Patient Education ? [place]. ? ?

## 2021-06-25 NOTE — Progress Notes (Signed)
? ? ?Subjective:  ? ? Patient ID: Danielle Chase, female    DOB: June 30, 1960, 61 y.o.   MRN: [id number] ? ? ?This visit occurred during the SARS-CoV-2 public health emergency.  Safety protocols were in place, including screening questions prior to the visit, additional usage of staff PPE, and extensive cleaning of exam room while observing appropriate contact time as indicated for disinfecting solutions. ? ? ? ?HPI ?Danielle Chase is here for  ?Chief Complaint  ?Patient presents with  ? Annual Exam  ? ? ? ? ? ?Medications and allergies reviewed with patient and updated if appropriate. ? ? ? ?Current Outpatient Medications on File Prior to Visit  ?Medication Sig Dispense Refill  ? aspirin 81 MG tablet Take 81 mg by mouth daily.    ? DULoxetine (CYMBALTA) 20 MG capsule TAKE 1 CAPSULE(20 MG) BY MOUTH DAILY 90 capsule 1  ? fluticasone (FLONASE) 50 MCG/ACT nasal spray SHAKE LIQUID AND USE 2 SPRAYS IN EACH NOSTRIL DAILY AS NEEDED 48 mL 1  ? hydrochlorothiazide (HYDRODIURIL) 25 MG tablet TAKE 1 TABLET BY MOUTH DAILY 90 tablet 1  ? hydrocortisone 2.5 % cream Apply 1 application. topically 2 (two) times daily.    ? ipratropium (ATROVENT) 0.03 % nasal spray Place 2 sprays into both nostrils every 12 (twelve) hours. 30 mL 0  ? losartan (COZAAR) 50 MG tablet TAKE 1 TABLET BY MOUTH EVERY DAY 90 tablet 1  ? metFORMIN (GLUCOPHAGE-XR) 500 MG 24 hr tablet TAKE 1 TABLET(500 MG) BY MOUTH DAILY WITH BREAKFAST 90 tablet 1  ? Multiple Vitamins tablet TAKE 1 TABLET BY MOUTH DAILY 30 tablet 5  ? propranolol ER (INDERAL LA) 60 MG 24 hr capsule TAKE 1 CAPSULE BY MOUTH EVERY DAY 90 capsule 1  ? simvastatin (ZOCOR) 40 MG tablet TAKE 1/2 TABLET BY MOUTH EVERY NIGHT AT BEDTIME 45 tablet 1  ? ?No current facility-administered medications on file prior to visit.  ? ? ?Review of Systems  ?Constitutional:  Negative for fever.  ?Eyes:  Negative for visual disturbance.  ?Respiratory:  Negative for cough, shortness of breath and wheezing.   ?Cardiovascular:   Negative for chest pain, palpitations and leg swelling.  ?Gastrointestinal:  Negative for abdominal pain, blood in stool, constipation, diarrhea and nausea.  ?     Occ gerd  ?Genitourinary:  Negative for dysuria.  ?     Abnormal odor  ?Musculoskeletal:  Negative for arthralgias and back pain.  ?Skin:  Negative for rash.  ?Neurological:  Negative for light-headedness and headaches.  ?Psychiatric/Behavioral:  Negative for dysphoric mood. The patient is not nervous/anxious.   ? ?   ?Objective:  ? ?Vitals:  ? 06/26/21 0749  ?BP: 122/76  ?Pulse: 71  ?Temp: 98.1 ?F (36.7 ?C)  ?SpO2: 97%  ? ?Filed Weights  ? 06/26/21 0749  ?Weight: 264 lb (119.7 kg)  ? ?Body mass index is 46.77 kg/m?. ? ?BP Readings from Last 3 Encounters:  ?06/26/21 122/76  ?03/19/21 114/72  ?12/26/20 122/78  ? ? ?Wt Readings from Last 3 Encounters:  ?06/26/21 264 lb (119.7 kg)  ?03/19/21 265 lb (120.2 kg)  ?12/26/20 271 lb (122.9 kg)  ? ? ?Depression screen [facility] 2/9 12/26/2020 06/29/2019 12/27/2018 12/22/2016 07/03/2015  ?Decreased Interest 0 0 0 0 0  ?Down, Depressed, Hopeless 0 0 0 0 0  ?PHQ - 2 Score 0 0 0 0 0  ?Altered sleeping '1 1 1 '$ - -  ?Tired, decreased energy '2 2 1 '$ - -  ?Change in appetite 0 0 1 - -  ?  Feeling bad or failure about yourself  0 0 0 - -  ?Trouble concentrating 0 1 1 - -  ?Moving slowly or fidgety/restless 0 0 0 - -  ?Suicidal thoughts 0 0 0 - -  ?PHQ-9 Score '3 4 4 '$ - -  ?Difficult doing work/chores Not difficult at all - - - -  ? ? ? ?GAD 7 : Generalized Anxiety Score 12/26/2020 06/29/2019 12/27/2018  ?Nervous, Anxious, on Edge - 0 1  ?Control/stop worrying 0 0 0  ?Worry too much - different things 0 0 0  ?Trouble relaxing 0 0 0  ?Restless 0 0 0  ?Easily annoyed or irritable 0 1 1  ?Afraid - awful might happen 0 0 0  ?Total GAD 7 Score - 1 2  ? ? ? ? ?  ?Physical Exam ?Constitutional: She appears well-developed and well-nourished. No distress.  ?HENT:  ?Head: Normocephalic and atraumatic.  ?Right Ear: External ear normal. Normal ear canal and  TM ?Left Ear: External ear normal.  Normal ear canal and TM ?Mouth/Throat: Oropharynx is clear and moist.  ?Eyes: Conjunctivae and EOM are normal.  ?Neck: Neck supple. No tracheal deviation present. No thyromegaly present.  ?No carotid bruit  ?Cardiovascular: Normal rate, regular rhythm and normal heart sounds.   ?No murmur heard.  No edema. ?Pulmonary/Chest: Effort normal and breath sounds normal. No respiratory distress. She has no wheezes. She has no rales.  ?Breast: deferred   ?Abdominal: Soft. She exhibits no distension. There is no tenderness.  ?Lymphadenopathy: She has no cervical adenopathy.  ?Skin: Skin is warm and dry. She is not diaphoretic.  ?Psychiatric: She has a normal mood and affect. Her behavior is normal.  ? ? ? ?Lab Results  ?Component Value Date  ? WBC 6.9 06/24/2020  ? HGB 14.9 06/24/2020  ? HCT 42.7 06/24/2020  ? PLT 202.0 06/24/2020  ? GLUCOSE 174 (H) 12/26/2020  ? CHOL 128 06/24/2020  ? TRIG 136.0 06/24/2020  ? HDL 41.30 06/24/2020  ? LDLCALC 59 06/24/2020  ? ALT 20 12/26/2020  ? AST 17 12/26/2020  ? NA 139 12/26/2020  ? K 3.7 12/26/2020  ? CL 100 12/26/2020  ? CREATININE 1.08 12/26/2020  ? BUN 14 12/26/2020  ? CO2 31 12/26/2020  ? TSH 2.56 06/24/2020  ? HGBA1C 7.5 (H) 12/26/2020  ? MICROALBUR <0.7 12/23/2015  ? ? ? ? ?   ?Assessment & Plan:  ? ?Physical exam: ?Screening blood work  ordered ?Exercise  regular ?Weight  working on weight loss ?Substance abuse  none ? ? ?Reviewed recommended immunizations. ? ? ?Health Maintenance  ?Topic Date Due  ? FOOT EXAM  12/27/2019  ? MAMMOGRAM  02/29/2020  ? HEMOGLOBIN A1C  06/25/2021  ? COVID-19 Vaccine (4 - Booster for [place] series) 07/11/2021 (Originally 03/12/2021)  ? Zoster Vaccines- Shingrix (1 of 2) 09/26/2021 (Originally 02/22/1979)  ? OPHTHALMOLOGY EXAM  06/30/2021  ? COLONOSCOPY (Pts 45-34yr Insurance coverage will need to be confirmed)  07/11/2021  ? PAP SMEAR-Modifier  03/02/2023  ? TETANUS/TDAP  12/23/2027  ? INFLUENZA VACCINE  Completed  ?  Hepatitis C Screening  Completed  ? HIV Screening  Completed  ? HPV VACCINES  Aged Out  ?  ? ? ? ? ? ? ?See Problem List for Assessment and Plan of chronic medical problems. ? ? ? ? ?

## 2021-06-26 ENCOUNTER — Ambulatory Visit (INDEPENDENT_AMBULATORY_CARE_PROVIDER_SITE_OTHER): Payer: 59 | Admitting: Internal Medicine

## 2021-06-26 ENCOUNTER — Other Ambulatory Visit: Payer: Self-pay

## 2021-06-26 VITALS — BP 122/76 | HR 71 | Temp 98.1°F | Ht 63.0 in | Wt 264.0 lb

## 2021-06-26 DIAGNOSIS — F419 Anxiety disorder, unspecified: Secondary | ICD-10-CM | POA: Diagnosis not present

## 2021-06-26 DIAGNOSIS — Z0000 Encounter for general adult medical examination without abnormal findings: Secondary | ICD-10-CM

## 2021-06-26 DIAGNOSIS — F32A Depression, unspecified: Secondary | ICD-10-CM | POA: Diagnosis not present

## 2021-06-26 DIAGNOSIS — R69 Illness, unspecified: Secondary | ICD-10-CM | POA: Diagnosis not present

## 2021-06-26 DIAGNOSIS — E1169 Type 2 diabetes mellitus with other specified complication: Secondary | ICD-10-CM | POA: Diagnosis not present

## 2021-06-26 DIAGNOSIS — I10 Essential (primary) hypertension: Secondary | ICD-10-CM | POA: Diagnosis not present

## 2021-06-26 DIAGNOSIS — E782 Mixed hyperlipidemia: Secondary | ICD-10-CM

## 2021-06-26 DIAGNOSIS — R8290 Unspecified abnormal findings in urine: Secondary | ICD-10-CM | POA: Diagnosis not present

## 2021-06-26 LAB — URINALYSIS, ROUTINE W REFLEX MICROSCOPIC
Bilirubin Urine: NEGATIVE
Hgb urine dipstick: NEGATIVE
Ketones, ur: NEGATIVE
Nitrite: NEGATIVE
Specific Gravity, Urine: <=1.005 — AB (ref 1.000–1.030)
Total Protein, Urine: NEGATIVE
Urine Glucose: NEGATIVE
Urobilinogen, UA: 0.2 (ref 0.0–1.0)
pH: 6 (ref 5.0–8.0)

## 2021-06-26 LAB — LIPID PANEL
Cholesterol: 134 mg/dL (ref 0–200)
HDL: 43.6 mg/dL (ref >39.00)
LDL Cholesterol: 63 mg/dL (ref 0–99)
NonHDL: 90.37
Total CHOL/HDL Ratio: 3
Triglycerides: 138 mg/dL (ref 0.0–149.0)
VLDL: 27.6 mg/dL (ref 0.0–40.0)

## 2021-06-26 LAB — CBC WITH DIFFERENTIAL/PLATELET
Basophils Absolute: 0.1 10*3/uL (ref 0.0–0.1)
Basophils Relative: 0.7 % (ref 0.0–3.0)
Eosinophils Absolute: 0.2 10*3/uL (ref 0.0–0.7)
Eosinophils Relative: 3.2 % (ref 0.0–5.0)
HCT: 42.5 % (ref 36.0–46.0)
Hemoglobin: 14.9 g/dL (ref 12.0–15.0)
Lymphocytes Relative: 19 % (ref 12.0–46.0)
Lymphs Abs: 1.5 10*3/uL (ref 0.7–4.0)
MCHC: 35 g/dL (ref 30.0–36.0)
MCV: 93.7 fl (ref 78.0–100.0)
Monocytes Absolute: 1.1 10*3/uL — ABNORMAL HIGH (ref 0.1–1.0)
Monocytes Relative: 13.5 % — ABNORMAL HIGH (ref 3.0–12.0)
Neutro Abs: 5 10*3/uL (ref 1.4–7.7)
Neutrophils Relative %: 63.6 % (ref 43.0–77.0)
Platelets: 206 10*3/uL (ref 150.0–400.0)
RBC: 4.54 Mil/uL (ref 3.87–5.11)
RDW: 12.9 % (ref 11.5–15.5)
WBC: 7.9 10*3/uL (ref 4.0–10.5)

## 2021-06-26 LAB — TSH: TSH: 2.94 u[IU]/mL (ref 0.35–5.50)

## 2021-06-26 LAB — COMPREHENSIVE METABOLIC PANEL
ALT: 21 U/L (ref 0–35)
AST: 18 U/L (ref 0–37)
Albumin: 4.1 g/dL (ref 3.5–5.2)
Alkaline Phosphatase: 55 U/L (ref 39–117)
BUN: 22 mg/dL (ref 6–23)
CO2: 32 mEq/L (ref 19–32)
Calcium: 10 mg/dL (ref 8.4–10.5)
Chloride: 99 mEq/L (ref 96–112)
Creatinine, Ser: 1.18 mg/dL (ref 0.40–1.20)
GFR: 49.91 mL/min — ABNORMAL LOW (ref >60.00)
Glucose, Bld: 140 mg/dL — ABNORMAL HIGH (ref 70–99)
Potassium: 3.5 mEq/L (ref 3.5–5.1)
Sodium: 139 mEq/L (ref 135–145)
Total Bilirubin: 0.9 mg/dL (ref 0.2–1.2)
Total Protein: 7.6 g/dL (ref 6.0–8.3)

## 2021-06-26 LAB — MICROALBUMIN / CREATININE URINE RATIO
Creatinine,U: 36.7 mg/dL
Microalb Creat Ratio: 1.9 mg/g (ref 0.0–30.0)
Microalb, Ur: <0.7 mg/dL (ref 0.0–1.9)

## 2021-06-26 LAB — HEMOGLOBIN A1C: Hgb A1c MFr Bld: 6.1 % (ref 4.6–6.5)

## 2021-06-26 MED ORDER — TRULICITY 3 MG/0.5ML ~~LOC~~ SOAJ: 3.0000 mg | SUBCUTANEOUS | 2 refills | Status: DC

## 2021-06-26 NOTE — Assessment & Plan Note (Signed)
Acute ?More intermittent ?UA ?

## 2021-06-26 NOTE — Assessment & Plan Note (Signed)
Chronic Regular exercise and healthy diet encouraged Check lipid panel  Continue simvastatin 20 mg nightly 

## 2021-06-26 NOTE — Assessment & Plan Note (Signed)
Chronic Controlled, Stable Continue duloxetine 20 mg daily 

## 2021-06-26 NOTE — Assessment & Plan Note (Signed)
Chronic ?Blood pressure well controlled ?CMP ?Continue HCTZ 25 mg daily, losartan 50 mg daily propranolol 60 mg daily ?

## 2021-06-26 NOTE — Assessment & Plan Note (Signed)
Chronic ?Stressed regular exercise ?Continue Trulicity-will increase to 3 mg weekly ?Discussed small portions, lots of vegetables, lean protein ?

## 2021-06-26 NOTE — Assessment & Plan Note (Signed)
Chronic ?Lab Results  ?Component Value Date  ? HGBA1C 7.5 (H) 12/26/2020  ? ?Sugars not ideally controlled when last checked ?Check A1c, urine microalbumin today ?Continue metformin XR 818 mg daily, Trulicity-we will discuss increasing dose to 3 mg daily ?Stressed regular exercise, diabetic diet ? ? ?

## 2021-06-28 ENCOUNTER — Encounter: Payer: Self-pay | Admitting: Internal Medicine

## 2021-06-29 DIAGNOSIS — R350 Frequency of micturition: Secondary | ICD-10-CM | POA: Diagnosis not present

## 2021-06-29 DIAGNOSIS — Z01419 Encounter for gynecological examination (general) (routine) without abnormal findings: Secondary | ICD-10-CM | POA: Diagnosis not present

## 2021-06-29 DIAGNOSIS — Z1231 Encounter for screening mammogram for malignant neoplasm of breast: Secondary | ICD-10-CM | POA: Diagnosis not present

## 2021-07-03 ENCOUNTER — Other Ambulatory Visit: Payer: Self-pay | Admitting: Internal Medicine

## 2021-07-22 ENCOUNTER — Encounter: Payer: Self-pay | Admitting: Gastroenterology

## 2021-07-28 ENCOUNTER — Other Ambulatory Visit: Payer: Self-pay | Admitting: Internal Medicine

## 2021-08-04 LAB — HM DIABETES EYE EXAM

## 2021-08-13 ENCOUNTER — Encounter: Payer: Self-pay | Admitting: Internal Medicine

## 2021-08-13 NOTE — Progress Notes (Signed)
Outside notes received. Information abstracted. Notes sent to scan.  

## 2021-08-27 DIAGNOSIS — R509 Fever, unspecified: Secondary | ICD-10-CM | POA: Diagnosis not present

## 2021-08-27 DIAGNOSIS — R051 Acute cough: Secondary | ICD-10-CM | POA: Diagnosis not present

## 2021-08-27 DIAGNOSIS — R519 Headache, unspecified: Secondary | ICD-10-CM | POA: Diagnosis not present

## 2021-08-27 DIAGNOSIS — H9209 Otalgia, unspecified ear: Secondary | ICD-10-CM | POA: Diagnosis not present

## 2021-08-30 ENCOUNTER — Other Ambulatory Visit: Payer: Self-pay | Admitting: Internal Medicine

## 2021-09-09 ENCOUNTER — Encounter: Payer: Self-pay | Admitting: Internal Medicine

## 2021-09-11 ENCOUNTER — Other Ambulatory Visit: Payer: Self-pay | Admitting: Internal Medicine

## 2021-09-11 DIAGNOSIS — U071 COVID-19: Secondary | ICD-10-CM | POA: Insufficient documentation

## 2021-09-11 MED ORDER — ALBUTEROL SULFATE HFA 108 (90 BASE) MCG/ACT IN AERS: 2.0000 | INHALATION_SPRAY | Freq: Four times a day (QID) | RESPIRATORY_TRACT | 1 refills | Status: DC | PRN

## 2021-09-21 ENCOUNTER — Telehealth: Payer: Self-pay | Admitting: Internal Medicine

## 2021-09-21 NOTE — Telephone Encounter (Signed)
Patient is on albuterol and propanolol - bronchial spasms may not be controlled because she is on propanolol. - do you want to change this.

## 2021-09-23 ENCOUNTER — Encounter: Payer: Self-pay | Admitting: Internal Medicine

## 2021-09-23 NOTE — Telephone Encounter (Signed)
We will double check with patient.  She has been on this medication for a long time and I do not think it is an issue, but will check with her if we need to make any changes.

## 2021-11-09 ENCOUNTER — Encounter: Payer: Self-pay | Admitting: Pulmonary Disease

## 2021-11-09 ENCOUNTER — Ambulatory Visit: Payer: 59 | Admitting: Pulmonary Disease

## 2021-11-09 DIAGNOSIS — G4733 Obstructive sleep apnea (adult) (pediatric): Secondary | ICD-10-CM | POA: Diagnosis not present

## 2021-11-09 NOTE — Assessment & Plan Note (Signed)
CPAP is working well on current auto settings. CPAP supplies will be renewed for a year  CPAP download was reviewed which shows excellent control of events on auto settings 5 to 15 cm with maximum pressure of 12 cm.  She is very compliant and CPAP is only helped improve her daytime somnolence and fatigue   Weight loss encouraged, compliance with goal of at least 4-6 hrs every night is the expectation. Advised against medications with sedative side effects Cautioned against driving when sleepy - understanding that sleepiness will vary on a day to day basis

## 2021-11-09 NOTE — Progress Notes (Signed)
   Subjective:    Patient ID: Danielle Chase, female    DOB: May 25, 1960, 61 y.o.   MRN: [id number]  HPI  61 year old obese diabetic hypertensive for follow-up of severe OSA. She was started on auto CPAP in January 2019   Annual follow-up visit. She is compliant with her CPAP machine. Denies any problems with mask or pressure  She was placed on albuterol when she had a nonresolving cough following COVID infection, she hardly needs this anymore  Significant tests/ events reviewed HST >> severe OSA 48/h   Review of Systems neg for any significant sore throat, dysphagia, itching, sneezing, nasal congestion or excess/ purulent secretions, fever, chills, sweats, unintended wt loss, pleuritic or exertional cp, hempoptysis, orthopnea pnd or change in chronic leg swelling. Also denies presyncope, palpitations, heartburn, abdominal pain, nausea, vomiting, diarrhea or change in bowel or urinary habits, dysuria,hematuria, rash, arthralgias, visual complaints, headache, numbness weakness or ataxia.     Objective:   Physical Exam  Gen. Pleasant, obese, in no distress ENT - no lesions, no post nasal drip Neck: No JVD, no thyromegaly, no carotid bruits Lungs: no use of accessory muscles, no dullness to percussion, decreased without rales or rhonchi  Cardiovascular: Rhythm regular, heart sounds  normal, no murmurs or gallops, no peripheral edema Musculoskeletal: No deformities, no cyanosis or clubbing , no tremors       Assessment & Plan:

## 2021-11-09 NOTE — Patient Instructions (Signed)
CPAP is working well on current auto settings. CPAP supplies will be renewed for a year

## 2021-12-27 ENCOUNTER — Encounter: Payer: Self-pay | Admitting: Internal Medicine

## 2021-12-27 NOTE — Progress Notes (Unsigned)
Subjective:    Patient ID: Danielle Chase, female    DOB: 12-May-1960, 61 y.o.   MRN: [id number]     HPI Danielle Chase is here for follow up of her chronic medical problems, including htn, DM, Hld, anxiety, depression, obesity, OSA, CKD  She is taking all of her medications as prescribed.  She has been active - in pool this summer.   Takes ibuprofen intermittently for aches.    Medications and allergies reviewed with patient and updated if appropriate.  Current Outpatient Medications on File Prior to Visit  Medication Sig Dispense Refill   aspirin 81 MG tablet Take 81 mg by mouth daily.     DULoxetine (CYMBALTA) 20 MG capsule TAKE 1 CAPSULE(20 MG) BY MOUTH DAILY 90 capsule 1   fluticasone (FLONASE) 50 MCG/ACT nasal spray SHAKE LIQUID AND USE 2 SPRAYS IN EACH NOSTRIL DAILY AS NEEDED 48 mL 1   hydrochlorothiazide (HYDRODIURIL) 25 MG tablet TAKE 1 TABLET BY MOUTH EVERY DAY 90 tablet 2   hydrocortisone 2.5 % cream Apply 1 application. topically 2 (two) times daily.     losartan (COZAAR) 50 MG tablet TAKE 1 TABLET BY MOUTH EVERY DAY 90 tablet 1   metFORMIN (GLUCOPHAGE-XR) 500 MG 24 hr tablet TAKE 1 TABLET(500 MG) BY MOUTH DAILY WITH BREAKFAST 90 tablet 2   Multiple Vitamins tablet TAKE 1 TABLET BY MOUTH DAILY 30 tablet 5   propranolol ER (INDERAL LA) 60 MG 24 hr capsule TAKE 1 CAPSULE BY MOUTH EVERY DAY 90 capsule 2   simvastatin (ZOCOR) 40 MG tablet TAKE 1/2 TABLET BY MOUTH EVERY NIGHT AT BEDTIME 45 tablet 1   No current facility-administered medications on file prior to visit.     Review of Systems  Constitutional:  Negative for chills and fever.  Respiratory:  Negative for cough, shortness of breath and wheezing.   Cardiovascular:  Negative for chest pain, palpitations and leg swelling.  Genitourinary:  Positive for dysuria and frequency.  Neurological:  Negative for light-headedness and headaches.       Objective:   Vitals:   12/28/21 0750  BP: 126/78  Pulse: 67   Temp: 98 F (36.7 C)  SpO2: 96%   BP Readings from Last 3 Encounters:  12/28/21 126/78  11/09/21 114/68  06/26/21 122/76   Wt Readings from Last 3 Encounters:  12/28/21 263 lb (119.3 kg)  11/09/21 261 lb 3.2 oz (118.5 kg)  06/26/21 264 lb (119.7 kg)   Body mass index is 45.14 kg/m.    Physical Exam Constitutional:      General: She is not in acute distress.    Appearance: Normal appearance.  HENT:     Head: Normocephalic and atraumatic.  Eyes:     Conjunctiva/sclera: Conjunctivae normal.  Cardiovascular:     Rate and Rhythm: Normal rate and regular rhythm.     Heart sounds: Normal heart sounds. No murmur heard. Pulmonary:     Effort: Pulmonary effort is normal. No respiratory distress.     Breath sounds: Normal breath sounds. No wheezing.  Musculoskeletal:     Cervical back: Neck supple.     Right lower leg: No edema.     Left lower leg: No edema.  Lymphadenopathy:     Cervical: No cervical adenopathy.  Skin:    General: Skin is warm and dry.     Findings: No rash.  Neurological:     Mental Status: She is alert. Mental status is at baseline.  Psychiatric:  Mood and Affect: Mood normal.        Behavior: Behavior normal.        Lab Results  Component Value Date   WBC 7.9 06/26/2021   HGB 14.9 06/26/2021   HCT 42.5 06/26/2021   PLT 206.0 06/26/2021   GLUCOSE 140 (H) 06/26/2021   CHOL 134 06/26/2021   TRIG 138.0 06/26/2021   HDL 43.60 06/26/2021   LDLCALC 63 06/26/2021   ALT 21 06/26/2021   AST 18 06/26/2021   NA 139 06/26/2021   K 3.5 06/26/2021   CL 99 06/26/2021   CREATININE 1.18 06/26/2021   BUN 22 06/26/2021   CO2 32 06/26/2021   TSH 2.94 06/26/2021   HGBA1C 6.1 06/26/2021   MICROALBUR <0.7 06/26/2021     Assessment & Plan:    See Problem List for Assessment and Plan of chronic medical problems.

## 2021-12-27 NOTE — Patient Instructions (Addendum)
     Flu vaccine today   Blood work was ordered.      Medications changes include :   none     Return in about 6 months (around 06/28/2022) for Physical Exam.

## 2021-12-28 ENCOUNTER — Ambulatory Visit (INDEPENDENT_AMBULATORY_CARE_PROVIDER_SITE_OTHER): Payer: 59 | Admitting: Internal Medicine

## 2021-12-28 VITALS — BP 126/78 | HR 67 | Temp 98.0°F | Ht 64.0 in | Wt 263.0 lb

## 2021-12-28 DIAGNOSIS — F419 Anxiety disorder, unspecified: Secondary | ICD-10-CM

## 2021-12-28 DIAGNOSIS — Z23 Encounter for immunization: Secondary | ICD-10-CM | POA: Diagnosis not present

## 2021-12-28 DIAGNOSIS — E1169 Type 2 diabetes mellitus with other specified complication: Secondary | ICD-10-CM

## 2021-12-28 DIAGNOSIS — F32A Depression, unspecified: Secondary | ICD-10-CM

## 2021-12-28 DIAGNOSIS — R350 Frequency of micturition: Secondary | ICD-10-CM

## 2021-12-28 DIAGNOSIS — E782 Mixed hyperlipidemia: Secondary | ICD-10-CM

## 2021-12-28 DIAGNOSIS — N183 Chronic kidney disease, stage 3 unspecified: Secondary | ICD-10-CM | POA: Insufficient documentation

## 2021-12-28 DIAGNOSIS — I10 Essential (primary) hypertension: Secondary | ICD-10-CM | POA: Diagnosis not present

## 2021-12-28 DIAGNOSIS — Z86010 Personal history of colonic polyps: Secondary | ICD-10-CM

## 2021-12-28 DIAGNOSIS — N1831 Chronic kidney disease, stage 3a: Secondary | ICD-10-CM | POA: Insufficient documentation

## 2021-12-28 LAB — COMPREHENSIVE METABOLIC PANEL
ALT: 19 U/L (ref 0–35)
AST: 17 U/L (ref 0–37)
Albumin: 3.9 g/dL (ref 3.5–5.2)
Alkaline Phosphatase: 55 U/L (ref 39–117)
BUN: 18 mg/dL (ref 6–23)
CO2: 30 mEq/L (ref 19–32)
Calcium: 9.4 mg/dL (ref 8.4–10.5)
Chloride: 100 mEq/L (ref 96–112)
Creatinine, Ser: 1.14 mg/dL (ref 0.40–1.20)
GFR: 51.83 mL/min — ABNORMAL LOW (ref >60.00)
Glucose, Bld: 182 mg/dL — ABNORMAL HIGH (ref 70–99)
Potassium: 4 mEq/L (ref 3.5–5.1)
Sodium: 138 mEq/L (ref 135–145)
Total Bilirubin: 0.8 mg/dL (ref 0.2–1.2)
Total Protein: 7.2 g/dL (ref 6.0–8.3)

## 2021-12-28 LAB — URINALYSIS, ROUTINE W REFLEX MICROSCOPIC
Bilirubin Urine: NEGATIVE
Ketones, ur: NEGATIVE
Nitrite: NEGATIVE
Specific Gravity, Urine: <=1.005 — AB (ref 1.000–1.030)
Total Protein, Urine: NEGATIVE
Urine Glucose: NEGATIVE
Urobilinogen, UA: 0.2 (ref 0.0–1.0)
pH: 6 (ref 5.0–8.0)

## 2021-12-28 LAB — MICROALBUMIN / CREATININE URINE RATIO
Creatinine,U: 28.5 mg/dL
Microalb Creat Ratio: 5.6 mg/g (ref 0.0–30.0)
Microalb, Ur: 1.6 mg/dL (ref 0.0–1.9)

## 2021-12-28 LAB — HEMOGLOBIN A1C: Hgb A1c MFr Bld: 7.2 % — ABNORMAL HIGH (ref 4.6–6.5)

## 2021-12-28 NOTE — Assessment & Plan Note (Signed)
Chronic Advised no nsaids, which she does take occasionally - advised tylenol only Increase fluids - water particularly BP well controlled Sugars well controlled

## 2021-12-28 NOTE — Assessment & Plan Note (Addendum)
Chronic With hyperlipidemia  Lab Results  Component Value Date   HGBA1C 6.1 06/26/2021   Sugars well controlled Check A1c, urine micro Continue metformin XR 500 mg daily with breakfast Stressed regular exercise, diabetic diet

## 2021-12-28 NOTE — Assessment & Plan Note (Signed)
Chronic Controlled, Stable Continue duloxetine 20 mg daily 

## 2021-12-28 NOTE — Addendum Note (Signed)
Addended by: Marcina Millard on: 12/28/2021 08:46 AM   Modules accepted: Orders

## 2021-12-28 NOTE — Assessment & Plan Note (Signed)
Chronic Blood pressure well controlled CMP Continue HCTZ 25 mg daily, losartan 50 mg daily, propranolol 60 mg daily

## 2021-12-28 NOTE — Assessment & Plan Note (Signed)
Chronic Lipids have been controlled Continue simvastatin 20 mg nightly

## 2021-12-28 NOTE — Assessment & Plan Note (Signed)
Acute H/o UTis  UA, UCx

## 2021-12-28 NOTE — Assessment & Plan Note (Signed)
Chronic Encouraged weight loss Did try Ozempic, but this was not effective.  It was Trulicity, but this became too expensive.  Rybelsus not covered Stressed decrease calorie intake, healthy diet low in carbs and sweets and regular exercise

## 2021-12-31 ENCOUNTER — Encounter: Payer: Self-pay | Admitting: Internal Medicine

## 2021-12-31 LAB — URINE CULTURE

## 2021-12-31 MED ORDER — AMOXICILLIN-POT CLAVULANATE 875-125 MG PO TABS: 1.0000 | ORAL_TABLET | Freq: Two times a day (BID) | ORAL | 0 refills | Status: AC

## 2021-12-31 MED ORDER — METFORMIN HCL ER 500 MG PO TB24: 1000.0000 mg | ORAL_TABLET | Freq: Every day | ORAL | 3 refills | Status: DC

## 2021-12-31 NOTE — Addendum Note (Signed)
Addended by: Binnie Rail on: 12/31/2021 07:04 PM   Modules accepted: Orders

## 2022-02-12 ENCOUNTER — Encounter: Payer: Self-pay | Admitting: Gastroenterology

## 2022-02-22 ENCOUNTER — Other Ambulatory Visit: Payer: Self-pay | Admitting: Internal Medicine

## 2022-02-23 ENCOUNTER — Other Ambulatory Visit: Payer: Self-pay | Admitting: Internal Medicine

## 2022-03-02 ENCOUNTER — Ambulatory Visit (AMBULATORY_SURGERY_CENTER): Payer: Self-pay

## 2022-03-02 VITALS — Ht 64.0 in | Wt 262.0 lb

## 2022-03-02 DIAGNOSIS — Z86010 Personal history of colonic polyps: Secondary | ICD-10-CM

## 2022-03-02 MED ORDER — NA SULFATE-K SULFATE-MG SULF 17.5-3.13-1.6 GM/177ML PO SOLN: 1.0000 | Freq: Once | ORAL | 0 refills | Status: AC

## 2022-03-02 NOTE — Progress Notes (Signed)
No egg or soy allergy known to patient;  No issues known to pt with past sedation with any surgeries or procedures; Patient denies ever being told they had issues or difficulty with intubation;  No FH of Malignant Hyperthermia; Pt is not on diet pills; Pt is not on home 02;  Pt is not on blood thinners; Pt denies issues with constipation;  No A fib or A flutter; Have any cardiac testing pending--NO Pt instructed to use Singlecare.[email or web address] or GoodRx for a price reduction on prep   Insurance verified during [place] appt=[employer]  Patient's chart reviewed by Osvaldo Angst CNRA prior to previsit and patient appropriate for the [place].  Previsit completed and red dot placed by patient's name on their procedure day (on provider's schedule).    GoodRx coupon given to patient during [facility] appt;

## 2022-03-08 ENCOUNTER — Encounter: Payer: Self-pay | Admitting: Gastroenterology

## 2022-03-25 DIAGNOSIS — D485 Neoplasm of uncertain behavior of skin: Secondary | ICD-10-CM | POA: Diagnosis not present

## 2022-03-25 DIAGNOSIS — L304 Erythema intertrigo: Secondary | ICD-10-CM | POA: Diagnosis not present

## 2022-03-25 DIAGNOSIS — D3611 Benign neoplasm of peripheral nerves and autonomic nervous system of face, head, and neck: Secondary | ICD-10-CM | POA: Diagnosis not present

## 2022-03-25 DIAGNOSIS — L918 Other hypertrophic disorders of the skin: Secondary | ICD-10-CM | POA: Diagnosis not present

## 2022-03-25 DIAGNOSIS — D225 Melanocytic nevi of trunk: Secondary | ICD-10-CM | POA: Diagnosis not present

## 2022-03-25 DIAGNOSIS — L570 Actinic keratosis: Secondary | ICD-10-CM | POA: Diagnosis not present

## 2022-03-25 DIAGNOSIS — Z85828 Personal history of other malignant neoplasm of skin: Secondary | ICD-10-CM | POA: Diagnosis not present

## 2022-03-25 DIAGNOSIS — L821 Other seborrheic keratosis: Secondary | ICD-10-CM | POA: Diagnosis not present

## 2022-03-25 DIAGNOSIS — L820 Inflamed seborrheic keratosis: Secondary | ICD-10-CM | POA: Diagnosis not present

## 2022-03-25 DIAGNOSIS — L814 Other melanin hyperpigmentation: Secondary | ICD-10-CM | POA: Diagnosis not present

## 2022-03-29 ENCOUNTER — Encounter: Payer: Self-pay | Admitting: Gastroenterology

## 2022-03-31 ENCOUNTER — Encounter: Payer: Self-pay | Admitting: Gastroenterology

## 2022-03-31 ENCOUNTER — Ambulatory Visit (AMBULATORY_SURGERY_CENTER): Payer: 59 | Admitting: Gastroenterology

## 2022-03-31 VITALS — BP 137/76 | HR 52 | Temp 97.5°F | Resp 13 | Ht 64.0 in | Wt 262.0 lb

## 2022-03-31 DIAGNOSIS — K621 Rectal polyp: Secondary | ICD-10-CM

## 2022-03-31 DIAGNOSIS — D122 Benign neoplasm of ascending colon: Secondary | ICD-10-CM

## 2022-03-31 DIAGNOSIS — Z09 Encounter for follow-up examination after completed treatment for conditions other than malignant neoplasm: Secondary | ICD-10-CM | POA: Diagnosis not present

## 2022-03-31 DIAGNOSIS — Z1211 Encounter for screening for malignant neoplasm of colon: Secondary | ICD-10-CM | POA: Diagnosis not present

## 2022-03-31 DIAGNOSIS — Z86010 Personal history of colonic polyps: Secondary | ICD-10-CM

## 2022-03-31 DIAGNOSIS — K635 Polyp of colon: Secondary | ICD-10-CM | POA: Diagnosis not present

## 2022-03-31 MED ORDER — SODIUM CHLORIDE 0.9 % IV SOLN: 500.0000 mL | Freq: Once | INTRAVENOUS | Status: DC

## 2022-03-31 NOTE — Progress Notes (Signed)
[facility] History and Physical   Primary Care Physician:  Binnie Rail, MD   Reason for Procedure:  History of adenomatous colon polyps  Plan:    Surveillance colonoscopy with possible interventions as needed     HPI: Danielle Chase is a very pleasant 61 y.o. female here for surveillance colonoscopy. Denies any nausea, vomiting, abdominal pain, melena or bright red blood per rectum  The risks and benefits as well as alternatives of endoscopic procedure(s) have been discussed and reviewed. All questions answered. The patient agrees to proceed.    Past Medical History:  Diagnosis Date   Diabetes mellitus without complication ([place])    on meds   Hyperlipidemia    Hypertension    Seasonal allergies    Sleep apnea    uses CPAP    Past Surgical History:  Procedure Laterality Date   CESAREAN SECTION  1988   CHOLECYSTECTOMY  2013   COLONOSCOPY  2016   DB-MAC-moviprep(good)-tics/ TA frag   POLYPECTOMY  2016   TA [place]    Prior to Admission medications   Medication Sig Start Date End Date Taking? Authorizing Provider  aspirin 81 MG tablet Take 81 mg by mouth 3 (three) times a week.   Yes [provider]  DULoxetine (CYMBALTA) 20 MG capsule TAKE 1 CAPSULE(20 MG) BY MOUTH DAILY Patient taking differently: Take 20 mg by mouth 3 (three) times a week. TAKE 1 CAPSULE(20 MG) BY MOUTH DAILY 02/24/22  Yes Burns, Claudina Lick, MD  hydrochlorothiazide (HYDRODIURIL) 25 MG tablet TAKE 1 TABLET BY MOUTH EVERY DAY 07/29/21  Yes Burns, Claudina Lick, MD  losartan (COZAAR) 50 MG tablet TAKE 1 TABLET BY MOUTH EVERY DAY Patient taking differently: Take 25 mg by mouth daily. TAKE 1 TABLET BY MOUTH EVERY DAY 03/25/21  Yes Burns, Claudina Lick, MD  metFORMIN (GLUCOPHAGE-XR) 500 MG 24 hr tablet Take 2 tablets (1,000 mg total) by mouth daily with breakfast. Patient taking differently: Take 500 mg by mouth 2 (two) times daily with a meal. 12/31/21  Yes Burns, Claudina Lick, MD   Multiple Vitamins tablet TAKE 1 TABLET BY MOUTH DAILY 08/24/12  Yes Darlyne Russian, MD  propranolol ER (INDERAL LA) 60 MG 24 hr capsule TAKE 1 CAPSULE BY MOUTH EVERY DAY 07/29/21  Yes Burns, Claudina Lick, MD  simvastatin (ZOCOR) 40 MG tablet TAKE 1/2 TABLET BY MOUTH EVERY NIGHT AT BEDTIME 02/22/22  Yes Burns, Claudina Lick, MD  fluticasone (FLONASE) 50 MCG/ACT nasal spray SHAKE LIQUID AND USE 2 SPRAYS IN EACH NOSTRIL DAILY AS NEEDED 04/27/21   Binnie Rail, MD  hydrocortisone 2.5 % cream Apply 1 application  topically 2 (two) times daily as needed (itching). 06/08/21   [provider]    Current Outpatient Medications  Medication Sig Dispense Refill   aspirin 81 MG tablet Take 81 mg by mouth 3 (three) times a week.     DULoxetine (CYMBALTA) 20 MG capsule TAKE 1 CAPSULE(20 MG) BY MOUTH DAILY (Patient taking differently: Take 20 mg by mouth 3 (three) times a week. TAKE 1 CAPSULE(20 MG) BY MOUTH DAILY) 90 capsule 1   hydrochlorothiazide (HYDRODIURIL) 25 MG tablet TAKE 1 TABLET BY MOUTH EVERY DAY 90 tablet 2   losartan (COZAAR) 50 MG tablet TAKE 1 TABLET BY MOUTH EVERY DAY (Patient taking differently: Take 25 mg by mouth daily. TAKE 1 TABLET BY MOUTH EVERY DAY) 90 tablet 1   metFORMIN (GLUCOPHAGE-XR) 500 MG 24 hr tablet Take 2  tablets (1,000 mg total) by mouth daily with breakfast. (Patient taking differently: Take 500 mg by mouth 2 (two) times daily with a meal.) 180 tablet 3   Multiple Vitamins tablet TAKE 1 TABLET BY MOUTH DAILY 30 tablet 5   propranolol ER (INDERAL LA) 60 MG 24 hr capsule TAKE 1 CAPSULE BY MOUTH EVERY DAY 90 capsule 2   simvastatin (ZOCOR) 40 MG tablet TAKE 1/2 TABLET BY MOUTH EVERY NIGHT AT BEDTIME 15 tablet 5   fluticasone (FLONASE) 50 MCG/ACT nasal spray SHAKE LIQUID AND USE 2 SPRAYS IN EACH NOSTRIL DAILY AS NEEDED 48 mL 1   hydrocortisone 2.5 % cream Apply 1 application  topically 2 (two) times daily as needed (itching).     Current Facility-Administered Medications   Medication Dose Route Frequency Provider Last Rate Last Admin   0.9 %  sodium chloride infusion  500 mL Intravenous Once Mauri Pole, MD        Allergies as of 03/31/2022   (No Known Allergies)    Family History  Problem Relation Age of Onset   COPD Mother    Heart disease Father    Breast cancer Sister    Diabetes Brother    Colon cancer Neg Hx    Rectal cancer Neg Hx    Stomach cancer Neg Hx    Colon polyps Neg Hx    Esophageal cancer Neg Hx     Social History   Socioeconomic History   Marital status: Married    Spouse name: Not on file   Number of children: Not on file   Years of education: Not on file   Highest education level: Not on file  Occupational History   Not on file  Tobacco Use   Smoking status: Former    Types: Cigarettes    Quit date: 05/01/2001    Years since quitting: 20.9   Smokeless tobacco: Never   Tobacco comments:    quit 12 yrs ago  Vaping Use   Vaping Use: Never used  Substance and Sexual Activity   Alcohol use: Yes    Alcohol/week: 0.0 - 1.0 standard drinks of alcohol    Comment: socially   Drug use: No   Sexual activity: Yes    Partners: Male  Other Topics Concern   Not on file  Social History Narrative   Not on file   Social Determinants of Health   Financial Resource Strain: Not on file  Food Insecurity: Not on file  Transportation Needs: Not on file  Physical Activity: Not on file  Stress: Not on file  Social Connections: Not on file  Intimate Partner Violence: Not on file    Review of Systems:  All other review of systems negative except as mentioned in the HPI.  Physical Exam: Vital signs in last 24 hours: Blood Pressure 125/63   Pulse 60   Temperature (Abnormal) 97.5 F (36.4 C)   Height _0  (1.626 m)   Weight 262 lb (118.8 kg)   Oxygen Saturation 97%   Body Mass Index 44.97 kg/m  General:   Alert, NAD Lungs:  Clear .   Heart:  Regular rate and rhythm Abdomen:  Soft, nontender and  nondistended. Neuro/Psych:  Alert and cooperative. Normal mood and affect. A and O x 3  Reviewed labs, radiology imaging, old records and pertinent past GI work up  Patient is appropriate for planned procedure(s) and anesthesia in an ambulatory setting   K. Denzil Magnuson , MD [phone]

## 2022-03-31 NOTE — Patient Instructions (Signed)
Handout provided on polyps.   Resume previous diet. Continue present medications.  Await pathology results.  Repeat colonoscopy in 5-10 years for surveillance based on pathology results.   YOU HAD AN ENDOSCOPIC PROCEDURE TODAY AT [facility]:   Refer to the procedure report that was given to you for any specific questions about what was found during the examination.  If the procedure report does not answer your questions, please call your gastroenterologist to clarify.  If you requested that your care partner not be given the details of your procedure findings, then the procedure report has been included in a sealed envelope for you to review at your convenience later.  YOU SHOULD EXPECT: Some feelings of bloating in the abdomen. Passage of more gas than usual.  Walking can help get rid of the air that was put into your GI tract during the procedure and reduce the bloating. If you had a lower endoscopy (such as a colonoscopy or flexible sigmoidoscopy) you may notice spotting of blood in your stool or on the toilet paper. If you underwent a bowel prep for your procedure, you may not have a normal bowel movement for a few days.  Please Note:  You might notice some irritation and congestion in your nose or some drainage.  This is from the oxygen used during your procedure.  There is no need for concern and it should clear up in a day or so.  SYMPTOMS TO REPORT IMMEDIATELY:  Following lower endoscopy (colonoscopy or flexible sigmoidoscopy):  Excessive amounts of blood in the stool  Significant tenderness or worsening of abdominal pains  Swelling of the abdomen that is new, acute  Fever of 100F or higher  For urgent or emergent issues, a gastroenterologist can be reached at any hour by calling [phone]. Do not use MyChart messaging for urgent concerns.    DIET:  We do recommend a small meal at first, but then you may proceed to your regular diet.  Drink plenty of fluids but  you should avoid alcoholic beverages for 24 hours.  ACTIVITY:  You should plan to take it easy for the rest of today and you should NOT DRIVE or use heavy machinery until tomorrow (because of the sedation medicines used during the test).    FOLLOW UP: Our staff will call the number listed on your records the next business day following your procedure.  We will call around 7:15- 8:00 am to check on you and address any questions or concerns that you may have regarding the information given to you following your procedure. If we do not reach you, we will leave a message.     If any biopsies were taken you will be contacted by phone or by letter within the next 1-3 weeks.  Please call us at [phone] if you have not heard about the biopsies in 3 weeks.    SIGNATURES/CONFIDENTIALITY: You and/or your care partner have signed paperwork which will be entered into your electronic medical record.  These signatures attest to the fact that that the information above on your After Visit Summary has been reviewed and is understood.  Full responsibility of the confidentiality of this discharge information lies with you and/or your care-partner.

## 2022-03-31 NOTE — Op Note (Signed)
[place] Patient Name: Danielle Chase Procedure Date: 03/31/2022 8:52 AM MRN: [id number] Endoscopist: Mauri Pole , MD, [id number] Age: 61 Referring MD:  Date of Birth: [birth year] Gender: Female Account #: [id number] Procedure:                Colonoscopy Indications:              High risk colon cancer surveillance: Personal                            history of colonic polyps, High risk colon cancer                            surveillance: Personal history of adenoma less than                            10 mm in size Medicines:                Monitored Anesthesia Care Procedure:                Pre-Anesthesia Assessment:                           - Prior to the procedure, a History and Physical                            was performed, and patient medications and                            allergies were reviewed. The patient's tolerance of                            previous anesthesia was also reviewed. The risks                            and benefits of the procedure and the sedation                            options and risks were discussed with the patient.                            All questions were answered, and informed consent                            was obtained. Prior Anticoagulants: The patient has                            taken no anticoagulant or antiplatelet agents. ASA                            Grade Assessment: II - A patient with mild systemic                            disease. After reviewing the risks and benefits,  the patient was deemed in satisfactory condition to                            undergo the procedure.                           After obtaining informed consent, the colonoscope                            was passed under direct vision. Throughout the                            procedure, the patient's blood pressure, pulse, and                            oxygen saturations were monitored  continuously. The                            0405 PCF-H190TL Slim SB Colonoscope was introduced                            through the anus and advanced to the the cecum,                            identified by appendiceal orifice and ileocecal                            valve. The colonoscopy was performed without                            difficulty. The patient tolerated the procedure                            well. The quality of the bowel preparation was                            good. The ileocecal valve, appendiceal orifice, and                            rectum were photographed. Scope In: 9:02:47 AM Scope Out: 9:27:33 AM Scope Withdrawal Time: 0 hours 17 minutes 35 seconds  Total Procedure Duration: 0 hours 24 minutes 46 seconds  Findings:                 The perianal and digital rectal examinations were                            normal.                           Four sessile polyps were found in the rectum and                            ascending colon. The polyps were 4 to 10 mm in  size. These polyps were removed with a cold snare.                            Resection and retrieval were complete. Complications:            No immediate complications. Estimated Blood Loss:     Estimated blood loss was minimal. Impression:               - Four 4 to 10 mm polyps in the rectum and in the                            ascending colon, removed with a cold snare.                            Resected and retrieved. Recommendation:           - Patient has a contact number available for                            emergencies. The signs and symptoms of potential                            delayed complications were discussed with the                            patient. Return to normal activities tomorrow.                            Written discharge instructions were provided to the                            patient.                           - Resume  previous diet.                           - Continue present medications.                           - Await pathology results.                           - Repeat colonoscopy in 5-10 years for surveillance                            based on pathology results. Mauri Pole, MD 03/31/2022 9:32:41 AM This report has been signed electronically.

## 2022-03-31 NOTE — Progress Notes (Signed)
Report to pacu rn. Vss. Care resumed by rn. 

## 2022-03-31 NOTE — Progress Notes (Signed)
Pt's states no medical or surgical changes since previsit or office visit. 

## 2022-03-31 NOTE — Progress Notes (Signed)
Called to room to assist during endoscopic procedure.  Patient ID and intended procedure confirmed with present staff. Received instructions for my participation in the procedure from the performing physician.  

## 2022-04-01 ENCOUNTER — Telehealth: Payer: Self-pay

## 2022-04-01 NOTE — Telephone Encounter (Signed)
Attempted f/u call. No answer, left VM. 

## 2022-04-08 ENCOUNTER — Encounter: Payer: Self-pay | Admitting: Gastroenterology

## 2022-04-22 ENCOUNTER — Other Ambulatory Visit: Payer: Self-pay | Admitting: Internal Medicine

## 2022-05-17 ENCOUNTER — Other Ambulatory Visit: Payer: Self-pay | Admitting: Internal Medicine

## 2022-06-27 ENCOUNTER — Encounter: Payer: Self-pay | Admitting: Internal Medicine

## 2022-06-27 NOTE — Patient Instructions (Addendum)
Blood work was ordered.   The lab is on the first floor.    Medications changes include :   none     Return in about 6 months (around 12/29/2022) for follow up.   Health Maintenance, Female Adopting a healthy lifestyle and getting preventive care are important in promoting health and wellness. Ask your health care provider about: The right schedule for you to have regular tests and exams. Things you can do on your own to prevent diseases and keep yourself healthy. What should I know about diet, weight, and exercise? Eat a healthy diet  Eat a diet that includes plenty of vegetables, fruits, low-fat dairy products, and lean protein. Do not eat a lot of foods that are high in solid fats, added sugars, or sodium. Maintain a healthy weight Body mass index (BMI) is used to identify weight problems. It estimates body fat based on height and weight. Your health care provider can help determine your BMI and help you achieve or maintain a healthy weight. Get regular exercise Get regular exercise. This is one of the most important things you can do for your health. Most adults should: Exercise for at least 150 minutes each week. The exercise should increase your heart rate and make you sweat (moderate-intensity exercise). Do strengthening exercises at least twice a week. This is in addition to the moderate-intensity exercise. Spend less time sitting. Even light physical activity can be beneficial. Watch cholesterol and blood lipids Have your blood tested for lipids and cholesterol at 63 years of age, then have this test every 5 years. Have your cholesterol levels checked more often if: Your lipid or cholesterol levels are high. You are older than 63 years of age. You are at high risk for heart disease. What should I know about cancer screening? Depending on your health history and family history, you may need to have cancer screening at various ages. This may include screening  for: Breast cancer. Cervical cancer. Colorectal cancer. Skin cancer. Lung cancer. What should I know about heart disease, diabetes, and high blood pressure? Blood pressure and heart disease High blood pressure causes heart disease and increases the risk of stroke. This is more likely to develop in people who have high blood pressure readings or are overweight. Have your blood pressure checked: Every 3-5 years if you are 31-26 years of age. Every year if you are 9 years old or older. Diabetes Have regular diabetes screenings. This checks your fasting blood sugar level. Have the screening done: Once every three years after age 34 if you are at a normal weight and have a low risk for diabetes. More often and at a younger age if you are overweight or have a high risk for diabetes. What should I know about preventing infection? Hepatitis B If you have a higher risk for hepatitis B, you should be screened for this virus. Talk with your health care provider to find out if you are at risk for hepatitis B infection. Hepatitis C Testing is recommended for: Everyone born from [birth year] through 1966. Anyone with known risk factors for hepatitis C. Sexually transmitted infections (STIs) Get screened for STIs, including gonorrhea and chlamydia, if: You are sexually active and are younger than 63 years of age. You are older than 63 years of age and your health care provider tells you that you are at risk for this type of infection. Your sexual activity has changed since you were last screened, and you are at  increased risk for chlamydia or gonorrhea. Ask your health care provider if you are at risk. Ask your health care provider about whether you are at high risk for HIV. Your health care provider may recommend a prescription medicine to help prevent HIV infection. If you choose to take medicine to prevent HIV, you should first get tested for HIV. You should then be tested every 3 months for as long as you  are taking the medicine. Pregnancy If you are about to stop having your period (premenopausal) and you may become pregnant, seek counseling before you get pregnant. Take 400 to 800 micrograms (mcg) of folic acid every day if you become pregnant. Ask for birth control (contraception) if you want to prevent pregnancy. Osteoporosis and menopause Osteoporosis is a disease in which the bones lose minerals and strength with aging. This can result in bone fractures. If you are 62 years old or older, or if you are at risk for osteoporosis and fractures, ask your health care provider if you should: Be screened for bone loss. Take a calcium or vitamin D supplement to lower your risk of fractures. Be given hormone replacement therapy (HRT) to treat symptoms of menopause. Follow these instructions at home: Alcohol use Do not drink alcohol if: Your health care provider tells you not to drink. You are pregnant, may be pregnant, or are planning to become pregnant. If you drink alcohol: Limit how much you have to: 0-1 drink a day. Know how much alcohol is in your drink. In the [place] one drink equals one 12 oz bottle of beer (355 mL), one 5 oz glass of wine (148 mL), or one 1 oz glass of hard liquor (44 mL). Lifestyle Do not use any products that contain nicotine or tobacco. These products include cigarettes, chewing tobacco, and vaping devices, such as e-cigarettes. If you need help quitting, ask your health care provider. Do not use street drugs. Do not share needles. Ask your health care provider for help if you need support or information about quitting drugs. General instructions Schedule regular health, dental, and eye exams. Stay current with your vaccines. Tell your health care provider if: You often feel depressed. You have ever been abused or do not feel safe at home. Summary Adopting a healthy lifestyle and getting preventive care are important in promoting health and wellness. Follow your  health care provider's instructions about healthy diet, exercising, and getting tested or screened for diseases. Follow your health care provider's instructions on monitoring your cholesterol and blood pressure. This information is not intended to replace advice given to you by your health care provider. Make sure you discuss any questions you have with your health care provider. Document Revised: 08/18/2020 Document Reviewed: 08/18/2020 Elsevier Patient Education  [employer].

## 2022-06-28 ENCOUNTER — Ambulatory Visit (INDEPENDENT_AMBULATORY_CARE_PROVIDER_SITE_OTHER): Payer: 59 | Admitting: Internal Medicine

## 2022-06-28 VITALS — BP 120/76 | HR 63 | Temp 98.1°F | Ht 64.0 in | Wt 259.0 lb

## 2022-06-28 DIAGNOSIS — I10 Essential (primary) hypertension: Secondary | ICD-10-CM | POA: Diagnosis not present

## 2022-06-28 DIAGNOSIS — E782 Mixed hyperlipidemia: Secondary | ICD-10-CM

## 2022-06-28 DIAGNOSIS — N1831 Chronic kidney disease, stage 3a: Secondary | ICD-10-CM | POA: Diagnosis not present

## 2022-06-28 DIAGNOSIS — F419 Anxiety disorder, unspecified: Secondary | ICD-10-CM | POA: Diagnosis not present

## 2022-06-28 DIAGNOSIS — F32A Depression, unspecified: Secondary | ICD-10-CM

## 2022-06-28 DIAGNOSIS — E1169 Type 2 diabetes mellitus with other specified complication: Secondary | ICD-10-CM

## 2022-06-28 DIAGNOSIS — Z0000 Encounter for general adult medical examination without abnormal findings: Secondary | ICD-10-CM | POA: Diagnosis not present

## 2022-06-28 LAB — COMPREHENSIVE METABOLIC PANEL
ALT: 26 U/L (ref 0–35)
AST: 24 U/L (ref 0–37)
Albumin: 4.1 g/dL (ref 3.5–5.2)
Alkaline Phosphatase: 49 U/L (ref 39–117)
BUN: 16 mg/dL (ref 6–23)
CO2: 32 mEq/L (ref 19–32)
Calcium: 10 mg/dL (ref 8.4–10.5)
Chloride: 99 mEq/L (ref 96–112)
Creatinine, Ser: 1.18 mg/dL (ref 0.40–1.20)
GFR: 49.56 mL/min — ABNORMAL LOW (ref >60.00)
Glucose, Bld: 159 mg/dL — ABNORMAL HIGH (ref 70–99)
Potassium: 4.3 mEq/L (ref 3.5–5.1)
Sodium: 139 mEq/L (ref 135–145)
Total Bilirubin: 1 mg/dL (ref 0.2–1.2)
Total Protein: 7.3 g/dL (ref 6.0–8.3)

## 2022-06-28 LAB — CBC WITH DIFFERENTIAL/PLATELET
Basophils Absolute: 0.1 10*3/uL (ref 0.0–0.1)
Basophils Relative: 1 % (ref 0.0–3.0)
Eosinophils Absolute: 0.2 10*3/uL (ref 0.0–0.7)
Eosinophils Relative: 3.6 % (ref 0.0–5.0)
HCT: 42.2 % (ref 36.0–46.0)
Hemoglobin: 14.8 g/dL (ref 12.0–15.0)
Lymphocytes Relative: 23.9 % (ref 12.0–46.0)
Lymphs Abs: 1.6 10*3/uL (ref 0.7–4.0)
MCHC: 35 g/dL (ref 30.0–36.0)
MCV: 93.9 fl (ref 78.0–100.0)
Monocytes Absolute: 1 10*3/uL (ref 0.1–1.0)
Monocytes Relative: 15.6 % — ABNORMAL HIGH (ref 3.0–12.0)
Neutro Abs: 3.7 10*3/uL (ref 1.4–7.7)
Neutrophils Relative %: 55.9 % (ref 43.0–77.0)
Platelets: 216 10*3/uL (ref 150.0–400.0)
RBC: 4.5 Mil/uL (ref 3.87–5.11)
RDW: 13.1 % (ref 11.5–15.5)
WBC: 6.6 10*3/uL (ref 4.0–10.5)

## 2022-06-28 LAB — LIPID PANEL
Cholesterol: 143 mg/dL (ref 0–200)
HDL: 45 mg/dL (ref >39.00)
LDL Cholesterol: 63 mg/dL (ref 0–99)
NonHDL: 97.99
Total CHOL/HDL Ratio: 3
Triglycerides: 176 mg/dL — ABNORMAL HIGH (ref 0.0–149.0)
VLDL: 35.2 mg/dL (ref 0.0–40.0)

## 2022-06-28 LAB — TSH: TSH: 2.47 u[IU]/mL (ref 0.35–5.50)

## 2022-06-28 LAB — HEMOGLOBIN A1C: Hgb A1c MFr Bld: 6.6 % — ABNORMAL HIGH (ref 4.6–6.5)

## 2022-06-28 MED ORDER — SIMVASTATIN 40 MG PO TABS: ORAL_TABLET | ORAL | 3 refills | Status: DC

## 2022-06-28 NOTE — Assessment & Plan Note (Signed)
Chronic Controlled, Stable Continue duloxetine 20 mg daily 

## 2022-06-28 NOTE — Assessment & Plan Note (Addendum)
Chronic   Lab Results  Component Value Date   HGBA1C 7.2 (H) 12/28/2021   Sugars not ideally controlled Check A1c Continue metformin XR 500 mg twice daily Consider jardiance if needed  Stressed regular exercise, diabetic diet

## 2022-06-28 NOTE — Assessment & Plan Note (Signed)
Chronic Lipids have been controlled Continue simvastatin 20 mg nightly 

## 2022-06-28 NOTE — Assessment & Plan Note (Signed)
Chronic Cmp, cbc 

## 2022-06-28 NOTE — Progress Notes (Signed)
Subjective:    Patient ID: Danielle Chase, female    DOB: 29-Jul-1960, 62 y.o.   MRN: [id number]      HPI Danielle Chase is here for a Physical exam and her chronic medical problems.      Medications and allergies reviewed with patient and updated if appropriate.  Current Outpatient Medications on File Prior to Visit  Medication Sig Dispense Refill   aspirin 81 MG tablet Take 81 mg by mouth 3 (three) times a week.     DULoxetine (CYMBALTA) 20 MG capsule TAKE 1 CAPSULE(20 MG) BY MOUTH DAILY 90 capsule 2   fluticasone (FLONASE) 50 MCG/ACT nasal spray SHAKE LIQUID AND USE 2 SPRAYS IN EACH NOSTRIL DAILY AS NEEDED 48 mL 1   hydrochlorothiazide (HYDRODIURIL) 25 MG tablet TAKE 1 TABLET BY MOUTH EVERY DAY 30 tablet 8   hydrocortisone 2.5 % cream Apply 1 application  topically 2 (two) times daily as needed (itching).     losartan (COZAAR) 50 MG tablet TAKE 1 TABLET BY MOUTH EVERY DAY 90 tablet 1   metFORMIN (GLUCOPHAGE-XR) 500 MG 24 hr tablet Take 2 tablets (1,000 mg total) by mouth daily with breakfast. (Patient taking differently: Take 500 mg by mouth 2 (two) times daily with a meal.) 180 tablet 3   Multiple Vitamins tablet TAKE 1 TABLET BY MOUTH DAILY 30 tablet 5   propranolol ER (INDERAL LA) 60 MG 24 hr capsule TAKE 1 CAPSULE BY MOUTH EVERY DAY 30 capsule 8   simvastatin (ZOCOR) 40 MG tablet TAKE 1/2 TABLET BY MOUTH EVERY NIGHT AT BEDTIME 15 tablet 5   No current facility-administered medications on file prior to visit.    Review of Systems  Constitutional:  Negative for fever.  Eyes:  Negative for visual disturbance.  Respiratory:  Negative for cough, shortness of breath and wheezing.   Cardiovascular:  Positive for palpitations (occ - chronic - not new/changed). Negative for chest pain and leg swelling.  Gastrointestinal:  Negative for abdominal pain, blood in stool, constipation and diarrhea.       No gerd  Genitourinary:  Negative for dysuria.  Musculoskeletal:  Negative for  arthralgias and back pain.  Skin:  Negative for rash.  Neurological:  Negative for light-headedness and headaches.  Psychiatric/Behavioral:  Positive for dysphoric mood (controlled). The patient is nervous/anxious (controlled).        Objective:   Vitals:   06/28/22 0830  BP: 120/76  Pulse: 63  Temp: 98.1 F (36.7 C)  SpO2: 94%   Filed Weights   06/28/22 0830  Weight: 259 lb (117.5 kg)   Body mass index is 44.46 kg/m.  BP Readings from Last 3 Encounters:  06/28/22 120/76  03/31/22 137/76  12/28/21 126/78    Wt Readings from Last 3 Encounters:  06/28/22 259 lb (117.5 kg)  03/31/22 262 lb (118.8 kg)  03/02/22 262 lb (118.8 kg)       Physical Exam Constitutional: She appears well-developed and well-nourished. No distress.  HENT:  Head: Normocephalic and atraumatic.  Right Ear: External ear normal. Normal ear canal and TM Left Ear: External ear normal.  Normal ear canal and TM Mouth/Throat: Oropharynx is clear and moist.  Eyes: Conjunctivae normal.  Neck: Neck supple. No tracheal deviation present. No thyromegaly present.  No carotid bruit  Cardiovascular: Normal rate, regular rhythm and normal heart sounds.   No murmur heard.  No edema. Pulmonary/Chest: Effort normal and breath sounds normal. No respiratory distress. She has no wheezes. She has no rales.  Breast: deferred   Abdominal: Soft. She exhibits no distension. There is no tenderness.  Lymphadenopathy: She has no cervical adenopathy.  Skin: Skin is warm and dry. She is not diaphoretic.  Psychiatric: She has a normal mood and affect. Her behavior is normal.     Lab Results  Component Value Date   WBC 7.9 06/26/2021   HGB 14.9 06/26/2021   HCT 42.5 06/26/2021   PLT 206.0 06/26/2021   GLUCOSE 182 (H) 12/28/2021   CHOL 134 06/26/2021   TRIG 138.0 06/26/2021   HDL 43.60 06/26/2021   LDLCALC 63 06/26/2021   ALT 19 12/28/2021   AST 17 12/28/2021   NA 138 12/28/2021   K 4.0 12/28/2021   CL 100  12/28/2021   CREATININE 1.14 12/28/2021   BUN 18 12/28/2021   CO2 30 12/28/2021   TSH 2.94 06/26/2021   HGBA1C 7.2 (H) 12/28/2021   MICROALBUR 1.6 12/28/2021         Assessment & Plan:   Physical exam: Screening blood work  ordered Exercise  gym 3/week Weight encouraged weight loss Substance abuse  none   Reviewed recommended immunizations.   Health Maintenance  Topic Date Due   Zoster Vaccines- Shingrix (1 of 2) Never done   FOOT EXAM  12/27/2019   MAMMOGRAM  02/29/2020   COVID-19 Vaccine (4 - 2023-24 season) 12/11/2021   HEMOGLOBIN A1C  06/28/2022   OPHTHALMOLOGY EXAM  08/05/2022   Diabetic kidney evaluation - eGFR measurement  12/29/2022   Diabetic kidney evaluation - Urine ACR  12/29/2022   PAP SMEAR-Modifier  03/02/2023   COLONOSCOPY (Pts 45-21yrs Insurance coverage will need to be confirmed)  03/31/2025   DTaP/Tdap/Td (3 - Td or Tdap) 12/23/2027   INFLUENZA VACCINE  Completed   Hepatitis C Screening  Completed   HIV Screening  Completed   HPV VACCINES  Aged Out          See Problem List for Assessment and Plan of chronic medical problems.

## 2022-06-28 NOTE — Assessment & Plan Note (Signed)
Chronic Encouraged weight loss Stressed decrease calorie intake, healthy diet low in carbs and sweets and regular exercise

## 2022-06-28 NOTE — Assessment & Plan Note (Signed)
Chronic Blood pressure well controlled CMP Continue HCTZ 25 mg daily, losartan 50 mg daily, propranolol 60 mg daily 

## 2022-07-01 DIAGNOSIS — Z01419 Encounter for gynecological examination (general) (routine) without abnormal findings: Secondary | ICD-10-CM | POA: Diagnosis not present

## 2022-07-01 DIAGNOSIS — Z1151 Encounter for screening for human papillomavirus (HPV): Secondary | ICD-10-CM | POA: Diagnosis not present

## 2022-07-01 DIAGNOSIS — Z124 Encounter for screening for malignant neoplasm of cervix: Secondary | ICD-10-CM | POA: Diagnosis not present

## 2022-07-01 DIAGNOSIS — Z1389 Encounter for screening for other disorder: Secondary | ICD-10-CM | POA: Diagnosis not present

## 2022-07-01 DIAGNOSIS — Z1231 Encounter for screening mammogram for malignant neoplasm of breast: Secondary | ICD-10-CM | POA: Diagnosis not present

## 2022-10-11 DIAGNOSIS — I10 Essential (primary) hypertension: Secondary | ICD-10-CM | POA: Diagnosis not present

## 2022-10-11 DIAGNOSIS — E119 Type 2 diabetes mellitus without complications: Secondary | ICD-10-CM | POA: Diagnosis not present

## 2022-10-11 DIAGNOSIS — H52223 Regular astigmatism, bilateral: Secondary | ICD-10-CM | POA: Diagnosis not present

## 2022-10-11 DIAGNOSIS — H53143 Visual discomfort, bilateral: Secondary | ICD-10-CM | POA: Diagnosis not present

## 2022-10-11 DIAGNOSIS — H5203 Hypermetropia, bilateral: Secondary | ICD-10-CM | POA: Diagnosis not present

## 2022-10-11 DIAGNOSIS — H35033 Hypertensive retinopathy, bilateral: Secondary | ICD-10-CM | POA: Diagnosis not present

## 2022-10-11 LAB — HM DIABETES EYE EXAM

## 2022-11-19 ENCOUNTER — Encounter: Payer: Self-pay | Admitting: Internal Medicine

## 2022-11-19 DIAGNOSIS — R509 Fever, unspecified: Secondary | ICD-10-CM | POA: Diagnosis not present

## 2022-11-19 DIAGNOSIS — R051 Acute cough: Secondary | ICD-10-CM | POA: Diagnosis not present

## 2022-11-19 DIAGNOSIS — R0981 Nasal congestion: Secondary | ICD-10-CM | POA: Diagnosis not present

## 2022-11-19 DIAGNOSIS — J069 Acute upper respiratory infection, unspecified: Secondary | ICD-10-CM | POA: Diagnosis not present

## 2022-11-25 ENCOUNTER — Encounter (INDEPENDENT_AMBULATORY_CARE_PROVIDER_SITE_OTHER): Payer: Self-pay

## 2022-11-28 ENCOUNTER — Other Ambulatory Visit: Payer: Self-pay | Admitting: Internal Medicine

## 2022-12-27 NOTE — Patient Instructions (Addendum)
     Flu immunization administered today.      Blood work was ordered.   The lab is on the first floor.    Medications changes include :   none     Return in about 6 months (around 06/27/2023) for Physical Exam.

## 2022-12-27 NOTE — Progress Notes (Unsigned)
Subjective:    Patient ID: Danielle Chase, female    DOB: 29-May-1960, 62 y.o.   MRN: [id number]     HPI Danielle Chase is here for follow up of her chronic medical problems.  Twin granddaughters were born recently-currently in the NICU and hopefully going home soon.   Fatigued - stays fatigued.  Gets 7 hrs of sleep.  Interrupted - bathroom - 2 times.    Going to gym regualrly.  Feels okay at the gym, but does get tired when she does things around the house.  Medications and allergies reviewed with patient and updated if appropriate.  Current Outpatient Medications on File Prior to Visit  Medication Sig Dispense Refill   aspirin 81 MG tablet Take 81 mg by mouth 3 (three) times a week.     DULoxetine (CYMBALTA) 20 MG capsule TAKE 1 CAPSULE(20 MG) BY MOUTH DAILY 90 capsule 2   fluticasone (FLONASE) 50 MCG/ACT nasal spray SHAKE LIQUID AND USE 2 SPRAYS IN EACH NOSTRIL DAILY AS NEEDED (INS MAX 30 DAYS) 16 mL 5   hydrochlorothiazide (HYDRODIURIL) 25 MG tablet TAKE 1 TABLET BY MOUTH EVERY DAY 30 tablet 8   hydrocortisone 2.5 % cream Apply 1 application  topically 2 (two) times daily as needed (itching).     losartan (COZAAR) 50 MG tablet TAKE 1 TABLET BY MOUTH EVERY DAY 90 tablet 1   metFORMIN (GLUCOPHAGE-XR) 500 MG 24 hr tablet Take 2 tablets (1,000 mg total) by mouth daily with breakfast. (Patient taking differently: Take 500 mg by mouth 2 (two) times daily with a meal.) 180 tablet 3   Multiple Vitamins tablet TAKE 1 TABLET BY MOUTH DAILY 30 tablet 5   propranolol ER (INDERAL LA) 60 MG 24 hr capsule TAKE 1 CAPSULE BY MOUTH EVERY DAY 30 capsule 8   simvastatin (ZOCOR) 40 MG tablet TAKE 1/2 TABLET BY MOUTH EVERY NIGHT AT BEDTIME 45 tablet 3   No current facility-administered medications on file prior to visit.     Review of Systems  Constitutional:  Positive for fatigue. Negative for fever.  Respiratory:  Positive for shortness of breath (sometimes with strenuous exertion - not new).  Negative for cough and wheezing.   Cardiovascular:  Positive for palpitations (occ). Negative for chest pain and leg swelling.  Neurological:  Negative for light-headedness and headaches.       Objective:   Vitals:   12/28/22 0837  BP: 120/78  Pulse: 64  Temp: 98 F (36.7 C)  SpO2: 94%   BP Readings from Last 3 Encounters:  12/28/22 120/78  06/28/22 120/76  03/31/22 137/76   Wt Readings from Last 3 Encounters:  12/28/22 257 lb (116.6 kg)  06/28/22 259 lb (117.5 kg)  03/31/22 262 lb (118.8 kg)   Body mass index is 44.11 kg/m.    Physical Exam Constitutional:      General: She is not in acute distress.    Appearance: Normal appearance.  HENT:     Head: Normocephalic and atraumatic.  Eyes:     Conjunctiva/sclera: Conjunctivae normal.  Cardiovascular:     Rate and Rhythm: Normal rate and regular rhythm.     Heart sounds: Normal heart sounds.  Pulmonary:     Effort: Pulmonary effort is normal. No respiratory distress.     Breath sounds: Normal breath sounds. No wheezing.  Musculoskeletal:     Cervical back: Neck supple.     Right lower leg: No edema.     Left lower leg: No  edema.  Lymphadenopathy:     Cervical: No cervical adenopathy.  Skin:    General: Skin is warm and dry.     Findings: No rash.  Neurological:     Mental Status: She is alert. Mental status is at baseline.  Psychiatric:        Mood and Affect: Mood normal.        Behavior: Behavior normal.        Lab Results  Component Value Date   WBC 6.6 06/28/2022   HGB 14.8 06/28/2022   HCT 42.2 06/28/2022   PLT 216.0 06/28/2022   GLUCOSE 159 (H) 06/28/2022   CHOL 143 06/28/2022   TRIG 176.0 (H) 06/28/2022   HDL 45.00 06/28/2022   LDLCALC 63 06/28/2022   ALT 26 06/28/2022   AST 24 06/28/2022   NA 139 06/28/2022   K 4.3 06/28/2022   CL 99 06/28/2022   CREATININE 1.18 06/28/2022   BUN 16 06/28/2022   CO2 32 06/28/2022   TSH 2.47 06/28/2022   HGBA1C 6.6 (H) 06/28/2022   MICROALBUR 1.6  12/28/2021     Assessment & Plan:    See Problem List for Assessment and Plan of chronic medical problems.

## 2022-12-28 ENCOUNTER — Ambulatory Visit: Payer: 59 | Admitting: Internal Medicine

## 2022-12-28 ENCOUNTER — Encounter: Payer: Self-pay | Admitting: Internal Medicine

## 2022-12-28 VITALS — BP 120/78 | HR 64 | Temp 98.0°F | Ht 64.0 in | Wt 257.0 lb

## 2022-12-28 DIAGNOSIS — E1169 Type 2 diabetes mellitus with other specified complication: Secondary | ICD-10-CM | POA: Diagnosis not present

## 2022-12-28 DIAGNOSIS — I10 Essential (primary) hypertension: Secondary | ICD-10-CM

## 2022-12-28 DIAGNOSIS — Z7984 Long term (current) use of oral hypoglycemic drugs: Secondary | ICD-10-CM | POA: Diagnosis not present

## 2022-12-28 DIAGNOSIS — N1831 Chronic kidney disease, stage 3a: Secondary | ICD-10-CM | POA: Diagnosis not present

## 2022-12-28 DIAGNOSIS — F419 Anxiety disorder, unspecified: Secondary | ICD-10-CM

## 2022-12-28 DIAGNOSIS — F32A Depression, unspecified: Secondary | ICD-10-CM | POA: Diagnosis not present

## 2022-12-28 DIAGNOSIS — Z23 Encounter for immunization: Secondary | ICD-10-CM | POA: Diagnosis not present

## 2022-12-28 DIAGNOSIS — E782 Mixed hyperlipidemia: Secondary | ICD-10-CM | POA: Diagnosis not present

## 2022-12-28 LAB — LIPID PANEL
Cholesterol: 133 mg/dL (ref 0–200)
HDL: 43.9 mg/dL (ref >39.00)
LDL Cholesterol: 56 mg/dL (ref 0–99)
NonHDL: 88.83
Total CHOL/HDL Ratio: 3
Triglycerides: 164 mg/dL — ABNORMAL HIGH (ref 0.0–149.0)
VLDL: 32.8 mg/dL (ref 0.0–40.0)

## 2022-12-28 LAB — CBC WITH DIFFERENTIAL/PLATELET
Basophils Absolute: 0.1 10*3/uL (ref 0.0–0.1)
Basophils Relative: 0.8 % (ref 0.0–3.0)
Eosinophils Absolute: 0.2 10*3/uL (ref 0.0–0.7)
Eosinophils Relative: 3.5 % (ref 0.0–5.0)
HCT: 41.7 % (ref 36.0–46.0)
Hemoglobin: 14.3 g/dL (ref 12.0–15.0)
Lymphocytes Relative: 23.4 % (ref 12.0–46.0)
Lymphs Abs: 1.5 10*3/uL (ref 0.7–4.0)
MCHC: 34.4 g/dL (ref 30.0–36.0)
MCV: 94 fl (ref 78.0–100.0)
Monocytes Absolute: 0.9 10*3/uL (ref 0.1–1.0)
Monocytes Relative: 13.5 % — ABNORMAL HIGH (ref 3.0–12.0)
Neutro Abs: 3.8 10*3/uL (ref 1.4–7.7)
Neutrophils Relative %: 58.8 % (ref 43.0–77.0)
Platelets: 234 10*3/uL (ref 150.0–400.0)
RBC: 4.44 Mil/uL (ref 3.87–5.11)
RDW: 12.7 % (ref 11.5–15.5)
WBC: 6.5 10*3/uL (ref 4.0–10.5)

## 2022-12-28 LAB — VITAMIN D 25 HYDROXY (VIT D DEFICIENCY, FRACTURES): VITD: 65.41 ng/mL (ref 30.00–100.00)

## 2022-12-28 LAB — COMPREHENSIVE METABOLIC PANEL
ALT: 20 U/L (ref 0–35)
AST: 17 U/L (ref 0–37)
Albumin: 4 g/dL (ref 3.5–5.2)
Alkaline Phosphatase: 59 U/L (ref 39–117)
BUN: 21 mg/dL (ref 6–23)
CO2: 32 meq/L (ref 19–32)
Calcium: 9.6 mg/dL (ref 8.4–10.5)
Chloride: 99 meq/L (ref 96–112)
Creatinine, Ser: 1.26 mg/dL — ABNORMAL HIGH (ref 0.40–1.20)
GFR: 45.65 mL/min — ABNORMAL LOW (ref >60.00)
Glucose, Bld: 161 mg/dL — ABNORMAL HIGH (ref 70–99)
Potassium: 4.4 meq/L (ref 3.5–5.1)
Sodium: 139 meq/L (ref 135–145)
Total Bilirubin: 1.1 mg/dL (ref 0.2–1.2)
Total Protein: 7.4 g/dL (ref 6.0–8.3)

## 2022-12-28 LAB — HEMOGLOBIN A1C: Hgb A1c MFr Bld: 6.7 % — ABNORMAL HIGH (ref 4.6–6.5)

## 2022-12-28 LAB — MICROALBUMIN / CREATININE URINE RATIO
Creatinine,U: 31 mg/dL
Microalb Creat Ratio: 2.3 mg/g (ref 0.0–30.0)
Microalb, Ur: <0.7 mg/dL (ref 0.0–1.9)

## 2022-12-28 NOTE — Assessment & Plan Note (Addendum)
Chronic Stable Cmp Discussed considering Farxiga/Jardiance

## 2022-12-28 NOTE — Assessment & Plan Note (Addendum)
Chronic Encouraged weight loss-she is working on it Continue regular exercise Continue healthy diet, lower portions

## 2022-12-28 NOTE — Assessment & Plan Note (Addendum)
Chronic Blood pressure well controlled CMP Continue HCTZ 25 mg daily, losartan 50 mg daily, propranolol 60 mg daily  EKG: Sinus bradycardia at 57 bpm, normal EKG.  No change compared to last EKG from 2016

## 2022-12-28 NOTE — Assessment & Plan Note (Addendum)
Chronic   Lab Results  Component Value Date   HGBA1C 6.6 (H) 06/28/2022   Sugars  controlled Check A1c, urine microalbumin Continue metformin XR 1000 mg daily Consider jardiance/Farxiga Stressed regular exercise, diabetic diet

## 2022-12-28 NOTE — Assessment & Plan Note (Addendum)
Chronic Controlled, Stable Continue duloxetine 20 mg daily Discussed that fatigue unlikely depression, but could consider trying a higher dose.  Also she feels like everything is well-controlled she could always try taking the medication every other day for a month or 2 and see how she does with that and then taper off the medication

## 2022-12-28 NOTE — Assessment & Plan Note (Signed)
Chronic Check lipid panel Healthy diet, exercise encouraged Continue simvastatin 20 mg nightly  Lab Results  Component Value Date   LDLCALC 63 06/28/2022

## 2023-01-02 ENCOUNTER — Encounter: Payer: Self-pay | Admitting: Internal Medicine

## 2023-01-03 MED ORDER — METFORMIN HCL ER 500 MG PO TB24: 500.0000 mg | ORAL_TABLET | Freq: Every day | ORAL | Status: DC

## 2023-01-03 MED ORDER — EMPAGLIFLOZIN 10 MG PO TABS: 10.0000 mg | ORAL_TABLET | Freq: Every day | ORAL | 5 refills | Status: DC

## 2023-01-06 ENCOUNTER — Other Ambulatory Visit: Payer: Self-pay | Admitting: Internal Medicine

## 2023-03-02 DIAGNOSIS — R300 Dysuria: Secondary | ICD-10-CM | POA: Diagnosis not present

## 2023-03-27 ENCOUNTER — Other Ambulatory Visit: Payer: Self-pay | Admitting: Internal Medicine

## 2023-03-28 DIAGNOSIS — D225 Melanocytic nevi of trunk: Secondary | ICD-10-CM | POA: Diagnosis not present

## 2023-03-28 DIAGNOSIS — L309 Dermatitis, unspecified: Secondary | ICD-10-CM | POA: Diagnosis not present

## 2023-03-28 DIAGNOSIS — L918 Other hypertrophic disorders of the skin: Secondary | ICD-10-CM | POA: Diagnosis not present

## 2023-03-28 DIAGNOSIS — L814 Other melanin hyperpigmentation: Secondary | ICD-10-CM | POA: Diagnosis not present

## 2023-03-28 DIAGNOSIS — D1801 Hemangioma of skin and subcutaneous tissue: Secondary | ICD-10-CM | POA: Diagnosis not present

## 2023-03-28 DIAGNOSIS — L820 Inflamed seborrheic keratosis: Secondary | ICD-10-CM | POA: Diagnosis not present

## 2023-03-28 DIAGNOSIS — L821 Other seborrheic keratosis: Secondary | ICD-10-CM | POA: Diagnosis not present

## 2023-03-28 DIAGNOSIS — D224 Melanocytic nevi of scalp and neck: Secondary | ICD-10-CM | POA: Diagnosis not present

## 2023-03-28 DIAGNOSIS — L570 Actinic keratosis: Secondary | ICD-10-CM | POA: Diagnosis not present

## 2023-03-28 DIAGNOSIS — Z85828 Personal history of other malignant neoplasm of skin: Secondary | ICD-10-CM | POA: Diagnosis not present

## 2023-04-23 ENCOUNTER — Other Ambulatory Visit: Payer: Self-pay | Admitting: Internal Medicine

## 2023-06-01 ENCOUNTER — Encounter (HOSPITAL_BASED_OUTPATIENT_CLINIC_OR_DEPARTMENT_OTHER): Payer: Self-pay | Admitting: Pulmonary Disease

## 2023-06-23 ENCOUNTER — Other Ambulatory Visit: Payer: Self-pay | Admitting: Internal Medicine

## 2023-06-27 ENCOUNTER — Ambulatory Visit: Payer: 59 | Admitting: Internal Medicine

## 2023-06-27 NOTE — Progress Notes (Signed)
      Subjective:    Patient ID: Danielle Chase, female    DOB: November 07, 1960, 63 y.o.   MRN: [id number]     HPI Danielle Chase is here for follow up of her chronic medical problems.    Medications and allergies reviewed with patient and updated if appropriate.  Current Outpatient Medications on File Prior to Visit  Medication Sig Dispense Refill  . aspirin 81 MG tablet Take 81 mg by mouth 3 (three) times a week.    . DULoxetine  (CYMBALTA ) 20 MG capsule TAKE 1 CAPSULE(20 MG) BY MOUTH DAILY 90 capsule 3  . fluticasone  (FLONASE ) 50 MCG/ACT nasal spray SHAKE LIQUID AND USE 2 SPRAYS IN EACH NOSTRIL DAILY AS NEEDED (INS MAX 30 DAYS) 48 mL 2  . hydrochlorothiazide  (HYDRODIURIL ) 25 MG tablet TAKE 1 TABLET BY MOUTH EVERY DAY 30 tablet 8  . hydrocortisone 2.5 % cream Apply 1 application  topically 2 (two) times daily as needed (itching).    . JARDIANCE  10 MG TABS tablet TAKE 1 TABLET BY MOUTH DAILY BEFORE BREAKFAST. 30 tablet 5  . losartan  (COZAAR ) 50 MG tablet TAKE 1 TABLET BY MOUTH EVERY DAY 90 tablet 2  . metFORMIN  (GLUCOPHAGE -XR) 500 MG 24 hr tablet TAKE 2 TABLETS BY MOUTH EVERY DAY WITH BREAKFAST 60 tablet 11  . Multiple Vitamins tablet TAKE 1 TABLET BY MOUTH DAILY 30 tablet 5  . propranolol  ER (INDERAL  LA) 60 MG 24 hr capsule TAKE 1 CAPSULE BY MOUTH EVERY DAY 30 capsule 8   No current facility-administered medications on file prior to visit.     Review of Systems     Objective:  There were no vitals filed for this visit. BP Readings from Last 3 Encounters:  06/30/23 124/68  12/28/22 120/78  06/28/22 120/76   Wt Readings from Last 3 Encounters:  06/30/23 245 lb (111.1 kg)  12/28/22 257 lb (116.6 kg)  06/28/22 259 lb (117.5 kg)   There is no height or weight on file to calculate BMI.    Physical Exam     Lab Results  Component Value Date   WBC 6.5 12/28/2022   HGB 14.3 12/28/2022   HCT 41.7 12/28/2022   PLT 234.0 12/28/2022   GLUCOSE 150 (H) 06/30/2023   CHOL 135  06/30/2023   TRIG 134.0 06/30/2023   HDL 45.20 06/30/2023   LDLCALC 63 06/30/2023   ALT 22 06/30/2023   AST 21 06/30/2023   NA 140 06/30/2023   K 4.0 06/30/2023   CL 100 06/30/2023   CREATININE 1.21 (H) 06/30/2023   BUN 18 06/30/2023   CO2 33 (H) 06/30/2023   TSH 2.47 06/28/2022   HGBA1C 6.7 (H) 06/30/2023   MICROALBUR <0.7 12/28/2022     Assessment & Plan:    See Problem List for Assessment and Plan of chronic medical problems.    This encounter was created in error - please disregard.

## 2023-06-27 NOTE — Patient Instructions (Addendum)
      Blood work was ordered.       Medications changes include :   None    A referral was ordered and someone will call you to schedule an appointment.     Return in about 6 months (around 12/29/2023) for Physical Exam.

## 2023-06-28 ENCOUNTER — Encounter: Payer: 59 | Admitting: Internal Medicine

## 2023-06-28 DIAGNOSIS — F419 Anxiety disorder, unspecified: Secondary | ICD-10-CM

## 2023-06-28 DIAGNOSIS — N1831 Chronic kidney disease, stage 3a: Secondary | ICD-10-CM

## 2023-06-28 DIAGNOSIS — E782 Mixed hyperlipidemia: Secondary | ICD-10-CM

## 2023-06-28 DIAGNOSIS — E1169 Type 2 diabetes mellitus with other specified complication: Secondary | ICD-10-CM

## 2023-06-28 DIAGNOSIS — I10 Essential (primary) hypertension: Secondary | ICD-10-CM

## 2023-06-28 NOTE — Assessment & Plan Note (Signed)
 Chronic Stable Cmp Continue Jardiance 10 mg daily

## 2023-06-28 NOTE — Assessment & Plan Note (Signed)
Chronic Blood pressure well controlled CMP Continue HCTZ 25 mg daily, losartan 50 mg daily, propranolol 60 mg daily 

## 2023-06-28 NOTE — Assessment & Plan Note (Signed)
 Chronic   Lab Results  Component Value Date   HGBA1C 6.7 (H) 12/28/2022   Sugars  controlled Check A1c Continue metformin XR 1000 mg daily, jardiance 10 mg daily Stressed regular exercise, diabetic diet

## 2023-06-28 NOTE — Assessment & Plan Note (Signed)
 Chronic Check lipid panel Healthy diet, exercise encouraged Continue simvastatin 20 mg nightly  Lab Results  Component Value Date   LDLCALC 56 12/28/2022

## 2023-06-28 NOTE — Assessment & Plan Note (Signed)
Chronic Controlled, Stable Continue duloxetine 20 mg daily 

## 2023-06-28 NOTE — Assessment & Plan Note (Signed)
 Chronic She is working on weight loss Continue regular exercise Continue healthy diet, lower portions

## 2023-06-29 NOTE — Progress Notes (Unsigned)
      Subjective:    Patient ID: Danielle Chase, female    DOB: 08/20/1960, 63 y.o.   MRN: [id number]     HPI Danielle Chase is here for follow up of her chronic medical problems.    Medications and allergies reviewed with patient and updated if appropriate.  Current Outpatient Medications on File Prior to Visit  Medication Sig Dispense Refill   aspirin 81 MG tablet Take 81 mg by mouth 3 (three) times a week.     DULoxetine (CYMBALTA) 20 MG capsule TAKE 1 CAPSULE(20 MG) BY MOUTH DAILY 90 capsule 3   fluticasone (FLONASE) 50 MCG/ACT nasal spray SHAKE LIQUID AND USE 2 SPRAYS IN EACH NOSTRIL DAILY AS NEEDED (INS MAX 30 DAYS) 48 mL 2   hydrochlorothiazide (HYDRODIURIL) 25 MG tablet TAKE 1 TABLET BY MOUTH EVERY DAY 30 tablet 8   hydrocortisone 2.5 % cream Apply 1 application  topically 2 (two) times daily as needed (itching).     JARDIANCE 10 MG TABS tablet TAKE 1 TABLET BY MOUTH DAILY BEFORE BREAKFAST. 30 tablet 5   losartan (COZAAR) 50 MG tablet TAKE 1 TABLET BY MOUTH EVERY DAY 90 tablet 2   metFORMIN (GLUCOPHAGE-XR) 500 MG 24 hr tablet TAKE 2 TABLETS BY MOUTH EVERY DAY WITH BREAKFAST 60 tablet 11   Multiple Vitamins tablet TAKE 1 TABLET BY MOUTH DAILY 30 tablet 5   propranolol ER (INDERAL LA) 60 MG 24 hr capsule TAKE 1 CAPSULE BY MOUTH EVERY DAY 30 capsule 8   simvastatin (ZOCOR) 40 MG tablet TAKE 1/2 TABLET BY MOUTH EVERY NIGHT AT BEDTIME 45 tablet 3   No current facility-administered medications on file prior to visit.     Review of Systems     Objective:  There were no vitals filed for this visit. BP Readings from Last 3 Encounters:  12/28/22 120/78  06/28/22 120/76  03/31/22 137/76   Wt Readings from Last 3 Encounters:  12/28/22 257 lb (116.6 kg)  06/28/22 259 lb (117.5 kg)  03/31/22 262 lb (118.8 kg)   There is no height or weight on file to calculate BMI.    Physical Exam     Lab Results  Component Value Date   WBC 6.5 12/28/2022   HGB 14.3 12/28/2022   HCT  41.7 12/28/2022   PLT 234.0 12/28/2022   GLUCOSE 161 (H) 12/28/2022   CHOL 133 12/28/2022   TRIG 164.0 (H) 12/28/2022   HDL 43.90 12/28/2022   LDLCALC 56 12/28/2022   ALT 20 12/28/2022   AST 17 12/28/2022   NA 139 12/28/2022   K 4.4 12/28/2022   CL 99 12/28/2022   CREATININE 1.26 (H) 12/28/2022   BUN 21 12/28/2022   CO2 32 12/28/2022   TSH 2.47 06/28/2022   HGBA1C 6.7 (H) 12/28/2022   MICROALBUR <0.7 12/28/2022     Assessment & Plan:    See Problem List for Assessment and Plan of chronic medical problems.

## 2023-06-29 NOTE — Patient Instructions (Addendum)
      Blood work was ordered.       Medications changes include :   None    A referral was ordered and someone will call you to schedule an appointment.     Return in about 6 months (around 12/31/2023) for Physical Exam.

## 2023-06-30 ENCOUNTER — Ambulatory Visit: Admitting: Internal Medicine

## 2023-06-30 ENCOUNTER — Encounter: Payer: Self-pay | Admitting: Internal Medicine

## 2023-06-30 VITALS — BP 124/68 | HR 78 | Temp 98.1°F | Ht 64.0 in | Wt 245.0 lb

## 2023-06-30 DIAGNOSIS — E1169 Type 2 diabetes mellitus with other specified complication: Secondary | ICD-10-CM

## 2023-06-30 DIAGNOSIS — F32A Depression, unspecified: Secondary | ICD-10-CM | POA: Diagnosis not present

## 2023-06-30 DIAGNOSIS — F419 Anxiety disorder, unspecified: Secondary | ICD-10-CM

## 2023-06-30 DIAGNOSIS — N1831 Chronic kidney disease, stage 3a: Secondary | ICD-10-CM

## 2023-06-30 DIAGNOSIS — I10 Essential (primary) hypertension: Secondary | ICD-10-CM | POA: Diagnosis not present

## 2023-06-30 DIAGNOSIS — E782 Mixed hyperlipidemia: Secondary | ICD-10-CM

## 2023-06-30 DIAGNOSIS — Z7984 Long term (current) use of oral hypoglycemic drugs: Secondary | ICD-10-CM

## 2023-06-30 LAB — LIPID PANEL
Cholesterol: 135 mg/dL (ref 0–200)
HDL: 45.2 mg/dL (ref >39.00)
LDL Cholesterol: 63 mg/dL (ref 0–99)
NonHDL: 90.27
Total CHOL/HDL Ratio: 3
Triglycerides: 134 mg/dL (ref 0.0–149.0)
VLDL: 26.8 mg/dL (ref 0.0–40.0)

## 2023-06-30 LAB — COMPREHENSIVE METABOLIC PANEL
ALT: 22 U/L (ref 0–35)
AST: 21 U/L (ref 0–37)
Albumin: 4.3 g/dL (ref 3.5–5.2)
Alkaline Phosphatase: 58 U/L (ref 39–117)
BUN: 18 mg/dL (ref 6–23)
CO2: 33 meq/L — ABNORMAL HIGH (ref 19–32)
Calcium: 10.1 mg/dL (ref 8.4–10.5)
Chloride: 100 meq/L (ref 96–112)
Creatinine, Ser: 1.21 mg/dL — ABNORMAL HIGH (ref 0.40–1.20)
GFR: 47.75 mL/min — ABNORMAL LOW (ref >60.00)
Glucose, Bld: 150 mg/dL — ABNORMAL HIGH (ref 70–99)
Potassium: 4 meq/L (ref 3.5–5.1)
Sodium: 140 meq/L (ref 135–145)
Total Bilirubin: 1.1 mg/dL (ref 0.2–1.2)
Total Protein: 7.6 g/dL (ref 6.0–8.3)

## 2023-06-30 LAB — HEMOGLOBIN A1C: Hgb A1c MFr Bld: 6.7 % — ABNORMAL HIGH (ref 4.6–6.5)

## 2023-06-30 NOTE — Assessment & Plan Note (Addendum)
 Chronic She is working on weight loss and has been losing weight Continue regular exercise Continue healthy diet, lower portions

## 2023-06-30 NOTE — Assessment & Plan Note (Addendum)
 Chronic  Lab Results  Component Value Date   HGBA1C 6.7 (H) 12/28/2022   Sugars  controlled Check A1c Continue metformin XR 1000 mg daily, jardiance 10 mg daily Has lost weight Stressed regular exercise, diabetic diet

## 2023-06-30 NOTE — Assessment & Plan Note (Signed)
 Chronic Check lipid panel Healthy diet, exercise encouraged Continue simvastatin 20 mg nightly  Lab Results  Component Value Date   LDLCALC 56 12/28/2022

## 2023-06-30 NOTE — Assessment & Plan Note (Signed)
Chronic Blood pressure well controlled CMP Continue HCTZ 25 mg daily, losartan 50 mg daily, propranolol 60 mg daily 

## 2023-06-30 NOTE — Assessment & Plan Note (Signed)
Chronic Controlled, Stable Continue duloxetine 20 mg daily 

## 2023-06-30 NOTE — Assessment & Plan Note (Signed)
 Chronic Stable Cmp Continue Jardiance 10 mg daily

## 2023-07-03 ENCOUNTER — Encounter: Payer: Self-pay | Admitting: Internal Medicine

## 2023-07-04 DIAGNOSIS — L293 Anogenital pruritus, unspecified: Secondary | ICD-10-CM | POA: Diagnosis not present

## 2023-07-04 DIAGNOSIS — Z1389 Encounter for screening for other disorder: Secondary | ICD-10-CM | POA: Diagnosis not present

## 2023-07-04 DIAGNOSIS — Z01419 Encounter for gynecological examination (general) (routine) without abnormal findings: Secondary | ICD-10-CM | POA: Diagnosis not present

## 2023-07-04 DIAGNOSIS — Z1231 Encounter for screening mammogram for malignant neoplasm of breast: Secondary | ICD-10-CM | POA: Diagnosis not present

## 2023-08-03 ENCOUNTER — Other Ambulatory Visit: Payer: Self-pay | Admitting: Internal Medicine

## 2023-08-04 ENCOUNTER — Encounter: Payer: Self-pay | Admitting: Internal Medicine

## 2023-10-10 ENCOUNTER — Other Ambulatory Visit: Payer: Self-pay | Admitting: Internal Medicine

## 2023-10-19 LAB — HM DIABETES EYE EXAM

## 2023-10-20 ENCOUNTER — Encounter: Payer: Self-pay | Admitting: Internal Medicine

## 2023-11-03 ENCOUNTER — Ambulatory Visit (HOSPITAL_BASED_OUTPATIENT_CLINIC_OR_DEPARTMENT_OTHER): Admitting: Pulmonary Disease

## 2023-11-03 ENCOUNTER — Encounter (HOSPITAL_BASED_OUTPATIENT_CLINIC_OR_DEPARTMENT_OTHER): Payer: Self-pay | Admitting: Pulmonary Disease

## 2023-11-03 VITALS — BP 100/66 | HR 66 | Ht 64.0 in | Wt 241.0 lb

## 2023-11-03 DIAGNOSIS — G4733 Obstructive sleep apnea (adult) (pediatric): Secondary | ICD-10-CM | POA: Diagnosis not present

## 2023-11-03 NOTE — Progress Notes (Signed)
   Subjective:    Patient ID: Danielle Chase, female    DOB: December 17, 1960, 63 y.o.   MRN: [id number]       63 yo obese diabetic hypertensive for follow-up of severe OSA. She was started on auto CPAP in January 2019    Discussed the use of AI scribe software for clinical note transcription with the patient, who gave verbal consent to proceed.  History of Present Illness Danielle Chase is a 63 year old female with obstructive sleep apnea who presents for a routine follow-up. Last ov 2 yrs ago  Her CPAP machine functions well with a nasal mask, providing comfortable pressure settings with minimal leaks and no significant dryness of the mouth. She feels rested and no longer requires frequent naps.   DL reviewed >>The CPAP machine auto-adjusts between 5 and 15 cm H2O, typically reaching a maximum of 11 to 12 cm H2O. Previous apnea events were 40 per hour, now reduced to 0.8 events per hour.     Significant tests/ events reviewed HST >> severe OSA 48/h  Review of Systems  neg for any significant sore throat, dysphagia, itching, sneezing, nasal congestion or excess/ purulent secretions, fever, chills, sweats, unintended wt loss, pleuritic or exertional cp, hempoptysis, orthopnea pnd or change in chronic leg swelling. Also denies presyncope, palpitations, heartburn, abdominal pain, nausea, vomiting, diarrhea or change in bowel or urinary habits, dysuria,hematuria, rash, arthralgias, visual complaints, headache, numbness weakness or ataxia.      Objective:   Physical Exam  Gen. Pleasant, obese, in no distress ENT - no lesions, no post nasal drip Neck: No JVD, no thyromegaly, no carotid bruits Lungs: no use of accessory muscles, no dullness to percussion, decreased without rales or rhonchi  Cardiovascular: Rhythm regular, heart sounds  normal, no murmurs or gallops, no peripheral edema Musculoskeletal: No deformities, no cyanosis or clubbing , no tremors       Assessment & Plan:    Assessment and Plan Assessment & Plan Obstructive Sleep Apnea Obstructive sleep apnea is well-controlled with CPAP therapy, reducing apnea events from 40 per hour to 0.8 per hour. She tolerates the nasal mask well, with minimal leak and no significant dryness of the mouth. Reports feeling rested and not requiring frequent naps. - Continue current CPAP settings and usage - Ensure annual prescription renewal for CPAP supplies -Very compliant, CPAP has helped improve daytime somnolence & fatigue  Hypertension Hypertension is managed with losartan  and Inderal .  Type 2 Diabetes Mellitus Type 2 diabetes mellitus is managed with metformin  and Jardiance . Previously tried injectable medications but found them ineffective. Focusing on weight loss as a primary strategy for managing diabetes.

## 2023-12-04 ENCOUNTER — Other Ambulatory Visit: Payer: Self-pay | Admitting: Internal Medicine

## 2023-12-07 ENCOUNTER — Other Ambulatory Visit: Payer: Self-pay | Admitting: Internal Medicine

## 2024-01-02 ENCOUNTER — Encounter: Payer: Self-pay | Admitting: Internal Medicine

## 2024-01-02 NOTE — Patient Instructions (Addendum)
 Prevnar 20 vaccine today    Blood work was ordered.       Medications changes include :   None     Return in about 6 months (around 07/02/2024) for follow up.    Health Maintenance, Female Adopting a healthy lifestyle and getting preventive care are important in promoting health and wellness. Ask your health care provider about: The right schedule for you to have regular tests and exams. Things you can do on your own to prevent diseases and keep yourself healthy. What should I know about diet, weight, and exercise? Eat a healthy diet  Eat a diet that includes plenty of vegetables, fruits, low-fat dairy products, and lean protein. Do not eat a lot of foods that are high in solid fats, added sugars, or sodium. Maintain a healthy weight Body mass index (BMI) is used to identify weight problems. It estimates body fat based on height and weight. Your health care provider can help determine your BMI and help you achieve or maintain a healthy weight. Get regular exercise Get regular exercise. This is one of the most important things you can do for your health. Most adults should: Exercise for at least 150 minutes each week. The exercise should increase your heart rate and make you sweat (moderate-intensity exercise). Do strengthening exercises at least twice a week. This is in addition to the moderate-intensity exercise. Spend less time sitting. Even light physical activity can be beneficial. Watch cholesterol and blood lipids Have your blood tested for lipids and cholesterol at 64 years of age, then have this test every 5 years. Have your cholesterol levels checked more often if: Your lipid or cholesterol levels are high. You are older than 64 years of age. You are at high risk for heart disease. What should I know about cancer screening? Depending on your health history and family history, you may need to have cancer screening at various ages. This may include screening  for: Breast cancer. Cervical cancer. Colorectal cancer. Skin cancer. Lung cancer. What should I know about heart disease, diabetes, and high blood pressure? Blood pressure and heart disease High blood pressure causes heart disease and increases the risk of stroke. This is more likely to develop in people who have high blood pressure readings or are overweight. Have your blood pressure checked: Every 3-5 years if you are 1-59 years of age. Every year if you are 38 years old or older. Diabetes Have regular diabetes screenings. This checks your fasting blood sugar level. Have the screening done: Once every three years after age 6 if you are at a normal weight and have a low risk for diabetes. More often and at a younger age if you are overweight or have a high risk for diabetes. What should I know about preventing infection? Hepatitis B If you have a higher risk for hepatitis B, you should be screened for this virus. Talk with your health care provider to find out if you are at risk for hepatitis B infection. Hepatitis C Testing is recommended for: Everyone born from [birth year] through 1966. Anyone with known risk factors for hepatitis C. Sexually transmitted infections (STIs) Get screened for STIs, including gonorrhea and chlamydia, if: You are sexually active and are younger than 64 years of age. You are older than 64 years of age and your health care provider tells you that you are at risk for this type of infection. Your sexual activity has changed since you were last screened, and you are at  increased risk for chlamydia or gonorrhea. Ask your health care provider if you are at risk. Ask your health care provider about whether you are at high risk for HIV. Your health care provider may recommend a prescription medicine to help prevent HIV infection. If you choose to take medicine to prevent HIV, you should first get tested for HIV. You should then be tested every 3 months for as long as you  are taking the medicine. Pregnancy If you are about to stop having your period (premenopausal) and you may become pregnant, seek counseling before you get pregnant. Take 400 to 800 micrograms (mcg) of folic acid every day if you become pregnant. Ask for birth control (contraception) if you want to prevent pregnancy. Osteoporosis and menopause Osteoporosis is a disease in which the bones lose minerals and strength with aging. This can result in bone fractures. If you are 13 years old or older, or if you are at risk for osteoporosis and fractures, ask your health care provider if you should: Be screened for bone loss. Take a calcium or vitamin D  supplement to lower your risk of fractures. Be given hormone replacement therapy (HRT) to treat symptoms of menopause. Follow these instructions at home: Alcohol use Do not drink alcohol if: Your health care provider tells you not to drink. You are pregnant, may be pregnant, or are planning to become pregnant. If you drink alcohol: Limit how much you have to: 0-1 drink a day. Know how much alcohol is in your drink. In the [place] one drink equals one 12 oz bottle of beer (355 mL), one 5 oz glass of wine (148 mL), or one 1 oz glass of hard liquor (44 mL). Lifestyle Do not use any products that contain nicotine or tobacco. These products include cigarettes, chewing tobacco, and vaping devices, such as e-cigarettes. If you need help quitting, ask your health care provider. Do not use street drugs. Do not share needles. Ask your health care provider for help if you need support or information about quitting drugs. General instructions Schedule regular health, dental, and eye exams. Stay current with your vaccines. Tell your health care provider if: You often feel depressed. You have ever been abused or do not feel safe at home. Summary Adopting a healthy lifestyle and getting preventive care are important in promoting health and wellness. Follow your  health care provider's instructions about healthy diet, exercising, and getting tested or screened for diseases. Follow your health care provider's instructions on monitoring your cholesterol and blood pressure. This information is not intended to replace advice given to you by your health care provider. Make sure you discuss any questions you have with your health care provider. Document Revised: 08/18/2020 Document Reviewed: 08/18/2020 Elsevier Patient Education  2024 [employer].

## 2024-01-02 NOTE — Progress Notes (Unsigned)
 Subjective:    Patient ID: Danielle Chase, female    DOB: 26-Jun-1960, 63 y.o.   MRN: [id number]      HPI Jazzlin is here for a Physical exam and her chronic medical problems.   Does not eat a lot of sugar.  She is exercising regularly.   Medications and allergies reviewed with patient and updated if appropriate.  Current Outpatient Medications on File Prior to Visit  Medication Sig Dispense Refill   aspirin 81 MG tablet Take 81 mg by mouth 3 (three) times a week.     DULoxetine  (CYMBALTA ) 20 MG capsule TAKE 1 CAPSULE(20 MG) BY MOUTH DAILY 90 capsule 4   fluticasone  (FLONASE ) 50 MCG/ACT nasal spray SHAKE LIQUID AND USE 2 SPRAYS IN EACH NOSTRIL DAILY AS NEEDED (INS MAX 30 DAYS) 48 mL 2   hydrochlorothiazide  (HYDRODIURIL ) 25 MG tablet TAKE 1 TABLET BY MOUTH EVERY DAY 30 tablet 8   hydrocortisone 2.5 % cream Apply 1 application  topically 2 (two) times daily as needed (itching).     JARDIANCE  10 MG TABS tablet TAKE 1 TABLET BY MOUTH EVERY DAY BEFORE BREAKFAST 30 tablet 5   losartan  (COZAAR ) 50 MG tablet TAKE 1 TABLET BY MOUTH EVERY DAY 90 tablet 2   metFORMIN  (GLUCOPHAGE -XR) 500 MG 24 hr tablet TAKE 2 TABLETS BY MOUTH EVERY DAY WITH BREAKFAST 60 tablet 11   Multiple Vitamins tablet TAKE 1 TABLET BY MOUTH DAILY 30 tablet 5   propranolol  ER (INDERAL  LA) 60 MG 24 hr capsule TAKE 1 CAPSULE BY MOUTH EVERY DAY 30 capsule 8   simvastatin  (ZOCOR ) 40 MG tablet TAKE 1/2 TABLET BY MOUTH EVERY NIGHT AT BEDTIME 15 tablet 11   No current facility-administered medications on file prior to visit.    Review of Systems  Constitutional:  Negative for fever.  Eyes:  Negative for visual disturbance.  Respiratory:  Negative for cough, shortness of breath and wheezing.   Cardiovascular:  Negative for chest pain, palpitations and leg swelling.  Gastrointestinal:  Negative for abdominal pain, blood in stool, constipation and diarrhea.       No gerd  Genitourinary:  Negative for dysuria.   Musculoskeletal:  Positive for arthralgias. Negative for back pain.  Skin:  Negative for rash.  Neurological:  Positive for numbness (numbness/tingling in hands at night). Negative for dizziness, light-headedness and headaches.  Psychiatric/Behavioral:  Negative for dysphoric mood. The patient is not nervous/anxious.        Objective:   Vitals:   01/03/24 0826  BP: 132/68  Pulse: 65  Temp: 98.3 F (36.8 C)  SpO2: 95%   Filed Weights   01/03/24 0826  Weight: 241 lb (109.3 kg)   Body mass index is 41.37 kg/m.  BP Readings from Last 3 Encounters:  01/03/24 132/68  11/03/23 100/66  06/30/23 124/68    Wt Readings from Last 3 Encounters:  01/03/24 241 lb (109.3 kg)  11/03/23 241 lb (109.3 kg)  06/30/23 245 lb (111.1 kg)       Physical Exam Constitutional: She appears well-developed and well-nourished. No distress.  HENT:  Head: Normocephalic and atraumatic.  Right Ear: External ear normal. Normal ear canal and TM Left Ear: External ear normal.  Normal ear canal and TM Mouth/Throat: Oropharynx is clear and moist.  Eyes: Conjunctivae normal.  Neck: Neck supple. No tracheal deviation present. No thyromegaly present.  No carotid bruit  Cardiovascular: Normal rate, regular rhythm and normal heart sounds.   No murmur heard.  No edema.  Pulmonary/Chest: Effort normal and breath sounds normal. No respiratory distress. She has no wheezes. She has no rales.  Breast: deferred   Abdominal: Soft. She exhibits no distension. There is no tenderness.  Lymphadenopathy: She has no cervical adenopathy.  Skin: Skin is warm and dry. She is not diaphoretic.  Psychiatric: She has a normal mood and affect. Her behavior is normal.   Diabetic Foot Exam - Simple   Simple Foot Form  01/03/2024 11:49 AM  Visual Inspection No deformities, no ulcerations, no other skin breakdown bilaterally: Yes Sensation Testing Intact to touch and monofilament testing bilaterally: Yes Pulse  Check Posterior Tibialis and Dorsalis pulse intact bilaterally: Yes Comments      Lab Results  Component Value Date   WBC 6.5 12/28/2022   HGB 14.3 12/28/2022   HCT 41.7 12/28/2022   PLT 234.0 12/28/2022   GLUCOSE 150 (H) 06/30/2023   CHOL 135 06/30/2023   TRIG 134.0 06/30/2023   HDL 45.20 06/30/2023   LDLCALC 63 06/30/2023   ALT 22 06/30/2023   AST 21 06/30/2023   NA 140 06/30/2023   K 4.0 06/30/2023   CL 100 06/30/2023   CREATININE 1.21 (H) 06/30/2023   BUN 18 06/30/2023   CO2 33 (H) 06/30/2023   TSH 2.47 06/28/2022   HGBA1C 6.7 (H) 06/30/2023   MICROALBUR 0.7 08/13/2014         Assessment & Plan:   Physical exam: Screening blood work  ordered Exercise  gym 3 days a week, other days cares for her grandchildren - twins almost 6 year old Weight  obese Substance abuse  none   Reviewed recommended immunizations.   Health Maintenance  Topic Date Due   Diabetic kidney evaluation - Urine ACR  Never done   Zoster Vaccines- Shingrix (1 of 2) Never done   FOOT EXAM  12/27/2019   Mammogram  02/29/2020   Cervical Cancer Screening (HPV/Pap Cotest)  03/01/2021   HEMOGLOBIN A1C  12/31/2023   COVID-19 Vaccine (5 - Pfizer risk 2024-25 season) 01/19/2024 (Originally 12/12/2023)   Diabetic kidney evaluation - eGFR measurement  06/29/2024   OPHTHALMOLOGY EXAM  10/18/2024   Colonoscopy  03/31/2025   DTaP/Tdap/Td (3 - Td or Tdap) 12/23/2027   Pneumococcal Vaccine: 50+ Years  Completed   Influenza Vaccine  Completed   Hepatitis C Screening  Completed   HIV Screening  Completed   Hepatitis B Vaccines 19-59 Average Risk  Aged Out   HPV VACCINES  Aged Out   Meningococcal B Vaccine  Aged Out          See Problem List for Assessment and Plan of chronic medical problems.

## 2024-01-03 ENCOUNTER — Ambulatory Visit (INDEPENDENT_AMBULATORY_CARE_PROVIDER_SITE_OTHER): Admitting: Internal Medicine

## 2024-01-03 VITALS — BP 132/68 | HR 65 | Temp 98.3°F | Ht 64.0 in | Wt 241.0 lb

## 2024-01-03 DIAGNOSIS — Z0000 Encounter for general adult medical examination without abnormal findings: Secondary | ICD-10-CM

## 2024-01-03 DIAGNOSIS — F419 Anxiety disorder, unspecified: Secondary | ICD-10-CM | POA: Diagnosis not present

## 2024-01-03 DIAGNOSIS — Z6841 Body Mass Index (BMI) 40.0 and over, adult: Secondary | ICD-10-CM

## 2024-01-03 DIAGNOSIS — E785 Hyperlipidemia, unspecified: Secondary | ICD-10-CM | POA: Diagnosis not present

## 2024-01-03 DIAGNOSIS — I152 Hypertension secondary to endocrine disorders: Secondary | ICD-10-CM

## 2024-01-03 DIAGNOSIS — E1122 Type 2 diabetes mellitus with diabetic chronic kidney disease: Secondary | ICD-10-CM

## 2024-01-03 DIAGNOSIS — N1831 Chronic kidney disease, stage 3a: Secondary | ICD-10-CM | POA: Diagnosis not present

## 2024-01-03 DIAGNOSIS — E1169 Type 2 diabetes mellitus with other specified complication: Secondary | ICD-10-CM

## 2024-01-03 DIAGNOSIS — E1159 Type 2 diabetes mellitus with other circulatory complications: Secondary | ICD-10-CM | POA: Diagnosis not present

## 2024-01-03 DIAGNOSIS — F32A Depression, unspecified: Secondary | ICD-10-CM

## 2024-01-03 DIAGNOSIS — G4733 Obstructive sleep apnea (adult) (pediatric): Secondary | ICD-10-CM

## 2024-01-03 DIAGNOSIS — Z23 Encounter for immunization: Secondary | ICD-10-CM | POA: Diagnosis not present

## 2024-01-03 DIAGNOSIS — E119 Type 2 diabetes mellitus without complications: Secondary | ICD-10-CM

## 2024-01-03 LAB — COMPREHENSIVE METABOLIC PANEL WITH GFR
ALT: 23 U/L (ref 0–35)
AST: 22 U/L (ref 0–37)
Albumin: 4.2 g/dL (ref 3.5–5.2)
Alkaline Phosphatase: 49 U/L (ref 39–117)
BUN: 27 mg/dL — ABNORMAL HIGH (ref 6–23)
CO2: 31 meq/L (ref 19–32)
Calcium: 9.3 mg/dL (ref 8.4–10.5)
Chloride: 98 meq/L (ref 96–112)
Creatinine, Ser: 1.57 mg/dL — ABNORMAL HIGH (ref 0.40–1.20)
GFR: 34.81 mL/min — ABNORMAL LOW (ref >60.00)
Glucose, Bld: 150 mg/dL — ABNORMAL HIGH (ref 70–99)
Potassium: 3.5 meq/L (ref 3.5–5.1)
Sodium: 137 meq/L (ref 135–145)
Total Bilirubin: 1.5 mg/dL — ABNORMAL HIGH (ref 0.2–1.2)
Total Protein: 7.3 g/dL (ref 6.0–8.3)

## 2024-01-03 LAB — HEMOGLOBIN A1C: Hgb A1c MFr Bld: 6.4 % (ref 4.6–6.5)

## 2024-01-03 LAB — LIPID PANEL
Cholesterol: 139 mg/dL (ref 0–200)
HDL: 39.5 mg/dL (ref >39.00)
LDL Cholesterol: 68 mg/dL (ref 0–99)
NonHDL: 99.35
Total CHOL/HDL Ratio: 4
Triglycerides: 159 mg/dL — ABNORMAL HIGH (ref 0.0–149.0)
VLDL: 31.8 mg/dL (ref 0.0–40.0)

## 2024-01-03 LAB — MICROALBUMIN / CREATININE URINE RATIO
Creatinine,U: 36.1 mg/dL
Microalb Creat Ratio: UNDETERMINED mg/g (ref 0.0–30.0)
Microalb, Ur: <0.7 mg/dL

## 2024-01-03 LAB — CBC WITH DIFFERENTIAL/PLATELET
Basophils Absolute: 0 K/uL (ref 0.0–0.1)
Basophils Relative: 0.8 % (ref 0.0–3.0)
Eosinophils Absolute: 0.1 K/uL (ref 0.0–0.7)
Eosinophils Relative: 2.7 % (ref 0.0–5.0)
HCT: 42.9 % (ref 36.0–46.0)
Hemoglobin: 14.9 g/dL (ref 12.0–15.0)
Lymphocytes Relative: 32.8 % (ref 12.0–46.0)
Lymphs Abs: 1.7 K/uL (ref 0.7–4.0)
MCHC: 34.7 g/dL (ref 30.0–36.0)
MCV: 93.6 fl (ref 78.0–100.0)
Monocytes Absolute: 0.8 K/uL (ref 0.1–1.0)
Monocytes Relative: 15.2 % — ABNORMAL HIGH (ref 3.0–12.0)
Neutro Abs: 2.6 K/uL (ref 1.4–7.7)
Neutrophils Relative %: 48.5 % (ref 43.0–77.0)
Platelets: 183 K/uL (ref 150.0–400.0)
RBC: 4.59 Mil/uL (ref 3.87–5.11)
RDW: 13.1 % (ref 11.5–15.5)
WBC: 5.3 K/uL (ref 4.0–10.5)

## 2024-01-03 LAB — TSH: TSH: 1.93 u[IU]/mL (ref 0.35–5.50)

## 2024-01-03 LAB — VITAMIN D 25 HYDROXY (VIT D DEFICIENCY, FRACTURES): VITD: 64.6 ng/mL (ref 30.00–100.00)

## 2024-01-03 NOTE — Assessment & Plan Note (Addendum)
 Chronic BMI 41.37 Working on [employer] loss-continue efforts Stressed regular exercise Discussed healthy diet, smaller portions

## 2024-01-03 NOTE — Assessment & Plan Note (Signed)
Chronic Controlled, Stable Continue duloxetine 20 mg daily 

## 2024-01-03 NOTE — Assessment & Plan Note (Addendum)
 Chronic Stage 3a-stable Cmp, CBC, vitamin D  level Continue Jardiance  10 mg daily, losartan  50 mg daily Avoid NSAIDs, increase water intake, low-sodium diet Work on weight loss

## 2024-01-03 NOTE — Assessment & Plan Note (Signed)
Chronic Blood pressure well controlled CMP Continue HCTZ 25 mg daily, losartan 50 mg daily, propranolol 60 mg daily 

## 2024-01-03 NOTE — Assessment & Plan Note (Signed)
 Chronic Check lipid panel, TSH, CMP Healthy diet, exercise encouraged Continue simvastatin  20 mg nightly  Lab Results  Component Value Date   LDLCALC 63 06/30/2023

## 2024-01-03 NOTE — Assessment & Plan Note (Signed)
Chronic Compliant with CPAP nightly 

## 2024-01-03 NOTE — Assessment & Plan Note (Signed)
 Chronic  Lab Results  Component Value Date   HGBA1C 6.7 (H) 06/30/2023   Sugars  controlled Check A1c, urine albumin/creatinine ratio Continue metformin  XR 1000 mg daily, jardiance  10 mg daily Has lost weight-continue weight loss efforts Stressed regular exercise, diabetic diet

## 2024-01-04 ENCOUNTER — Ambulatory Visit: Payer: Self-pay | Admitting: Internal Medicine

## 2024-01-04 DIAGNOSIS — R350 Frequency of micturition: Secondary | ICD-10-CM

## 2024-01-04 DIAGNOSIS — N1831 Chronic kidney disease, stage 3a: Secondary | ICD-10-CM

## 2024-01-20 ENCOUNTER — Other Ambulatory Visit (INDEPENDENT_AMBULATORY_CARE_PROVIDER_SITE_OTHER)

## 2024-01-20 DIAGNOSIS — R350 Frequency of micturition: Secondary | ICD-10-CM

## 2024-01-20 DIAGNOSIS — N1831 Chronic kidney disease, stage 3a: Secondary | ICD-10-CM | POA: Diagnosis not present

## 2024-01-20 LAB — URINALYSIS, ROUTINE W REFLEX MICROSCOPIC
Bilirubin Urine: NEGATIVE
Hgb urine dipstick: NEGATIVE
Ketones, ur: NEGATIVE
Leukocytes,Ua: NEGATIVE
Nitrite: NEGATIVE
Specific Gravity, Urine: 1.015 (ref 1.000–1.030)
Total Protein, Urine: NEGATIVE
Urine Glucose: >=1000 — AB
Urobilinogen, UA: 0.2 (ref 0.0–1.0)
pH: 6 (ref 5.0–8.0)

## 2024-01-20 LAB — BASIC METABOLIC PANEL WITH GFR
BUN: 24 mg/dL — ABNORMAL HIGH (ref 6–23)
CO2: 30 meq/L (ref 19–32)
Calcium: 9 mg/dL (ref 8.4–10.5)
Chloride: 100 meq/L (ref 96–112)
Creatinine, Ser: 1.3 mg/dL — ABNORMAL HIGH (ref 0.40–1.20)
GFR: 43.64 mL/min — ABNORMAL LOW (ref >60.00)
Glucose, Bld: 118 mg/dL — ABNORMAL HIGH (ref 70–99)
Potassium: 3.6 meq/L (ref 3.5–5.1)
Sodium: 138 meq/L (ref 135–145)

## 2024-01-21 ENCOUNTER — Ambulatory Visit: Payer: Self-pay | Admitting: Internal Medicine

## 2024-01-22 LAB — URINE CULTURE: Result:: NO GROWTH

## 2024-02-03 ENCOUNTER — Other Ambulatory Visit: Payer: Self-pay | Admitting: Internal Medicine

## 2024-04-21 ENCOUNTER — Other Ambulatory Visit: Payer: Self-pay | Admitting: Internal Medicine

## 2024-07-02 ENCOUNTER — Ambulatory Visit: Admitting: Internal Medicine
